# Patient Record
Sex: Female | Born: 1935 | Race: Black or African American | Hispanic: No | Marital: Married | Smoking: Never smoker
Health system: Southern US, Community
[De-identification: ages and names within clinical notes are randomized; demographics above are authoritative.]

## PROBLEM LIST (undated history)

## (undated) DIAGNOSIS — M1990 Unspecified osteoarthritis, unspecified site: Secondary | ICD-10-CM

## (undated) DIAGNOSIS — F419 Anxiety disorder, unspecified: Secondary | ICD-10-CM

## (undated) DIAGNOSIS — F0280 Dementia in other diseases classified elsewhere without behavioral disturbance: Secondary | ICD-10-CM

## (undated) DIAGNOSIS — E559 Vitamin D deficiency, unspecified: Secondary | ICD-10-CM

## (undated) DIAGNOSIS — R636 Underweight: Secondary | ICD-10-CM

## (undated) DIAGNOSIS — IMO0001 Reserved for inherently not codable concepts without codable children: Secondary | ICD-10-CM

## (undated) DIAGNOSIS — K649 Unspecified hemorrhoids: Secondary | ICD-10-CM

## (undated) DIAGNOSIS — G8929 Other chronic pain: Secondary | ICD-10-CM

## (undated) DIAGNOSIS — H269 Unspecified cataract: Secondary | ICD-10-CM

## (undated) DIAGNOSIS — E43 Unspecified severe protein-calorie malnutrition: Secondary | ICD-10-CM

## (undated) DIAGNOSIS — D649 Anemia, unspecified: Secondary | ICD-10-CM

## (undated) DIAGNOSIS — E119 Type 2 diabetes mellitus without complications: Secondary | ICD-10-CM

## (undated) DIAGNOSIS — E538 Deficiency of other specified B group vitamins: Secondary | ICD-10-CM

## (undated) DIAGNOSIS — I10 Essential (primary) hypertension: Secondary | ICD-10-CM

## (undated) DIAGNOSIS — G309 Alzheimer's disease, unspecified: Secondary | ICD-10-CM

## (undated) DIAGNOSIS — R296 Repeated falls: Secondary | ICD-10-CM

## (undated) DIAGNOSIS — I739 Peripheral vascular disease, unspecified: Secondary | ICD-10-CM

## (undated) DIAGNOSIS — K5909 Other constipation: Secondary | ICD-10-CM

## (undated) DIAGNOSIS — N289 Disorder of kidney and ureter, unspecified: Secondary | ICD-10-CM

## (undated) DIAGNOSIS — H9190 Unspecified hearing loss, unspecified ear: Secondary | ICD-10-CM

## (undated) DIAGNOSIS — I459 Conduction disorder, unspecified: Secondary | ICD-10-CM

## (undated) DIAGNOSIS — K5641 Fecal impaction: Secondary | ICD-10-CM

## (undated) HISTORY — PX: APPENDECTOMY: SHX54

## (undated) HISTORY — PX: CATARACT EXTRACTION, BILATERAL: SHX1313

## (undated) HISTORY — PX: TONSILLECTOMY: SUR1361

---

## 1997-06-24 ENCOUNTER — Other Ambulatory Visit: Admission: RE | Admit: 1997-06-24 | Discharge: 1997-06-24 | Payer: Self-pay | Admitting: Internal Medicine

## 1999-01-25 ENCOUNTER — Encounter: Payer: Self-pay | Admitting: *Deleted

## 1999-01-25 ENCOUNTER — Encounter: Admission: RE | Admit: 1999-01-25 | Discharge: 1999-01-25 | Payer: Self-pay | Admitting: *Deleted

## 2001-05-24 ENCOUNTER — Observation Stay (HOSPITAL_COMMUNITY): Admission: EM | Admit: 2001-05-24 | Discharge: 2001-05-25 | Payer: Self-pay | Admitting: Emergency Medicine

## 2003-07-21 ENCOUNTER — Emergency Department (HOSPITAL_COMMUNITY): Admission: EM | Admit: 2003-07-21 | Discharge: 2003-07-21 | Payer: Self-pay | Admitting: Emergency Medicine

## 2005-07-10 ENCOUNTER — Encounter: Admission: RE | Admit: 2005-07-10 | Discharge: 2005-07-10 | Payer: Self-pay | Admitting: Internal Medicine

## 2006-02-28 ENCOUNTER — Encounter: Admission: RE | Admit: 2006-02-28 | Discharge: 2006-02-28 | Payer: Self-pay | Admitting: Internal Medicine

## 2010-07-02 ENCOUNTER — Emergency Department (HOSPITAL_COMMUNITY): Payer: Medicare Other

## 2010-07-02 ENCOUNTER — Emergency Department (HOSPITAL_COMMUNITY)
Admission: EM | Admit: 2010-07-02 | Discharge: 2010-07-03 | Disposition: A | Discharge status: Home / Self Care | Payer: Medicare Other | Attending: Emergency Medicine | Admitting: Emergency Medicine

## 2010-07-02 DIAGNOSIS — T1490XA Injury, unspecified, initial encounter: Secondary | ICD-10-CM | POA: Insufficient documentation

## 2010-07-02 DIAGNOSIS — W19XXXA Unspecified fall, initial encounter: Secondary | ICD-10-CM | POA: Insufficient documentation

## 2010-07-02 DIAGNOSIS — M5030 Other cervical disc degeneration, unspecified cervical region: Secondary | ICD-10-CM | POA: Insufficient documentation

## 2010-07-02 DIAGNOSIS — N390 Urinary tract infection, site not specified: Secondary | ICD-10-CM | POA: Insufficient documentation

## 2010-07-02 DIAGNOSIS — M542 Cervicalgia: Secondary | ICD-10-CM | POA: Insufficient documentation

## 2010-07-02 DIAGNOSIS — R51 Headache: Secondary | ICD-10-CM | POA: Insufficient documentation

## 2010-07-02 DIAGNOSIS — M25569 Pain in unspecified knee: Secondary | ICD-10-CM | POA: Insufficient documentation

## 2010-07-02 DIAGNOSIS — R059 Cough, unspecified: Secondary | ICD-10-CM | POA: Insufficient documentation

## 2010-07-02 DIAGNOSIS — Y92009 Unspecified place in unspecified non-institutional (private) residence as the place of occurrence of the external cause: Secondary | ICD-10-CM | POA: Insufficient documentation

## 2010-07-02 DIAGNOSIS — R0602 Shortness of breath: Secondary | ICD-10-CM | POA: Insufficient documentation

## 2010-07-02 DIAGNOSIS — R05 Cough: Secondary | ICD-10-CM | POA: Insufficient documentation

## 2010-07-02 DIAGNOSIS — M79609 Pain in unspecified limb: Secondary | ICD-10-CM | POA: Insufficient documentation

## 2010-07-02 DIAGNOSIS — M25559 Pain in unspecified hip: Secondary | ICD-10-CM | POA: Insufficient documentation

## 2010-07-02 LAB — URINALYSIS, ROUTINE W REFLEX MICROSCOPIC
Ketones, ur: NEGATIVE mg/dL
Nitrite: NEGATIVE
Protein, ur: 100 mg/dL — AB
Urobilinogen, UA: 1 mg/dL (ref 0.0–1.0)

## 2010-07-02 LAB — GLUCOSE, CAPILLARY: Glucose-Capillary: 152 mg/dL — ABNORMAL HIGH (ref 70–99)

## 2010-07-02 LAB — URINE MICROSCOPIC-ADD ON

## 2010-07-03 LAB — URINE CULTURE
Colony Count: 25000
Culture  Setup Time: 201205150343

## 2010-12-09 ENCOUNTER — Emergency Department (HOSPITAL_COMMUNITY): Payer: Medicare Other

## 2010-12-09 ENCOUNTER — Observation Stay (HOSPITAL_COMMUNITY)
Admission: EM | Admit: 2010-12-09 | Discharge: 2010-12-12 | Disposition: A | Discharge status: Home Health | Payer: Medicare Other | Attending: Internal Medicine | Admitting: Internal Medicine

## 2010-12-09 DIAGNOSIS — M25519 Pain in unspecified shoulder: Secondary | ICD-10-CM | POA: Insufficient documentation

## 2010-12-09 DIAGNOSIS — R55 Syncope and collapse: Secondary | ICD-10-CM | POA: Insufficient documentation

## 2010-12-09 DIAGNOSIS — R197 Diarrhea, unspecified: Secondary | ICD-10-CM | POA: Insufficient documentation

## 2010-12-09 DIAGNOSIS — K7689 Other specified diseases of liver: Secondary | ICD-10-CM | POA: Insufficient documentation

## 2010-12-09 DIAGNOSIS — E119 Type 2 diabetes mellitus without complications: Secondary | ICD-10-CM | POA: Insufficient documentation

## 2010-12-09 DIAGNOSIS — R5381 Other malaise: Secondary | ICD-10-CM | POA: Insufficient documentation

## 2010-12-09 DIAGNOSIS — I10 Essential (primary) hypertension: Secondary | ICD-10-CM | POA: Insufficient documentation

## 2010-12-09 DIAGNOSIS — I252 Old myocardial infarction: Secondary | ICD-10-CM | POA: Insufficient documentation

## 2010-12-09 DIAGNOSIS — R1032 Left lower quadrant pain: Principal | ICD-10-CM | POA: Insufficient documentation

## 2010-12-09 DIAGNOSIS — I2510 Atherosclerotic heart disease of native coronary artery without angina pectoris: Secondary | ICD-10-CM | POA: Insufficient documentation

## 2010-12-09 DIAGNOSIS — R112 Nausea with vomiting, unspecified: Secondary | ICD-10-CM | POA: Insufficient documentation

## 2010-12-09 DIAGNOSIS — I498 Other specified cardiac arrhythmias: Secondary | ICD-10-CM | POA: Insufficient documentation

## 2010-12-09 LAB — CBC
HCT: 35.4 % — ABNORMAL LOW (ref 36.0–46.0)
MCHC: 33.6 g/dL (ref 30.0–36.0)
MCV: 70.5 fL — ABNORMAL LOW (ref 78.0–100.0)
Platelets: 233 10*3/uL (ref 150–400)
WBC: 5 10*3/uL (ref 4.0–10.5)

## 2010-12-09 LAB — URINALYSIS, ROUTINE W REFLEX MICROSCOPIC
Bilirubin Urine: NEGATIVE
Glucose, UA: NEGATIVE mg/dL
Hgb urine dipstick: NEGATIVE
Protein, ur: NEGATIVE mg/dL
Urobilinogen, UA: 1 mg/dL (ref 0.0–1.0)

## 2010-12-09 LAB — COMPREHENSIVE METABOLIC PANEL
Albumin: 3.8 g/dL (ref 3.5–5.2)
Alkaline Phosphatase: 62 U/L (ref 39–117)
BUN: 21 mg/dL (ref 6–23)
CO2: 23 mEq/L (ref 19–32)
Chloride: 97 mEq/L (ref 96–112)
Creatinine, Ser: 0.84 mg/dL (ref 0.50–1.10)
GFR calc Af Amer: 68 mL/min — ABNORMAL LOW (ref >90)
GFR calc non Af Amer: 59 mL/min — ABNORMAL LOW (ref >90)
Glucose, Bld: 130 mg/dL — ABNORMAL HIGH (ref 70–99)
Potassium: 4.2 mEq/L (ref 3.5–5.1)
Total Protein: 7.2 g/dL (ref 6.0–8.3)

## 2010-12-09 LAB — URINE MICROSCOPIC-ADD ON

## 2010-12-09 LAB — POCT I-STAT TROPONIN I: Troponin i, poc: 0 ng/mL (ref 0.00–0.08)

## 2010-12-09 MED ORDER — IOHEXOL 300 MG/ML  SOLN
100.0000 mL | Freq: Once | INTRAMUSCULAR | Status: AC | PRN
  Administered 2010-12-09: 100 mL via INTRAVENOUS

## 2010-12-10 LAB — CBC
MCHC: 32.7 g/dL (ref 30.0–36.0)
MCV: 70.2 fL — ABNORMAL LOW (ref 78.0–100.0)
RBC: 4.7 MIL/uL (ref 3.87–5.11)
WBC: 4.4 10*3/uL (ref 4.0–10.5)

## 2010-12-10 LAB — PRO B NATRIURETIC PEPTIDE: Pro B Natriuretic peptide (BNP): 371.9 pg/mL (ref 0–450)

## 2010-12-10 LAB — GLUCOSE, CAPILLARY
Glucose-Capillary: 93 mg/dL (ref 70–99)
Glucose-Capillary: 113 mg/dL — ABNORMAL HIGH (ref 70–99)
Glucose-Capillary: 95 mg/dL (ref 70–99)

## 2010-12-10 LAB — BASIC METABOLIC PANEL
CO2: 29 mEq/L (ref 19–32)
Calcium: 9.5 mg/dL (ref 8.4–10.5)
GFR calc non Af Amer: 51 mL/min — ABNORMAL LOW (ref >90)
Glucose, Bld: 88 mg/dL (ref 70–99)
Potassium: 4 mEq/L (ref 3.5–5.1)
Sodium: 136 mEq/L (ref 135–145)

## 2010-12-10 LAB — TROPONIN I: Troponin I: <0.3 ng/mL (ref <0.30)

## 2010-12-11 NOTE — H&P (Signed)
NAMELULANI, BOUR NO.:  [id number]  MEDICAL RECORD NO.:  [id number]  LOCATION:  2002                         FACILITY:  [facility]  PHYSICIAN:  Thomasenia Bottoms, MDDATE OF BIRTH:  01-16-1917  DATE OF ADMISSION:  12/09/2010 DATE OF DISCHARGE:                             HISTORY & PHYSICAL   PRIMARY CARE PHYSICIAN:  Dr. Earl Gala.  CARDIOLOGIST:  Grubbs.  CHIEF COMPLAINT:  Episode of sickness and near syncope in church.  HISTORY OF PRESENTING ILLNESS:  Ms. Shropshire is a very pleasant 77 year old woman who was brought in after an episode at church while she felt very sick.  The patient though she is an excellent historian on most things does not recall the episode that happened today in church.  I had to get this from the ED records and from the patient's grandson who was not present with her, but she apparently was nauseated and anxious and also had epigastric pain and they had to leave church.  Once the patient got to the emergency department, her primary complaint was abdominal pain.  It is in the epigastric area.  She tells me she has been having some trouble with diarrhea, actually had a dark stool a few days ago, and the epigastric pain has been pretty constant, but the nausea and possible diaphoresis and the anxiety sort of episode that happened at church, I do not have any more specific details about.  PAST MEDICAL HISTORY:  Significant for an MI at some point in the past. She has a history of diabetes and hypertension.  She was hospitalized a few months ago after a fall, but there are no records of that hospitalization here.  SOCIAL HISTORY:  She does not smoke cigarettes, drink alcohol, or use any illicit drugs.  FAMILY HISTORY:  Noncontributory in this 77 year old, however, she does have a son who is present and appears healthy.  REVIEW OF SYSTEMS:  The patient is not able to recall many details of her complaints over the last couple of  days.  She walks with a cane, but was wearing high heels at church today while walking with her cane, so she appears to be fairly functional.  She does report that she has been having some diarrhea, but she has been eating.  She had cereal this morning prior to going to church.  She also does tell me she was awake all night last night.  She had trouble sleeping.  Her power was out for some time yesterday evening.  The patient is unable to provide any other reliable history.  She denies, however, any fevers or chest pain or shortness of breath or headache.  PHYSICAL EXAMINATION:  VITAL SIGNS:  In the emergency department, her initial temperature is 97.5, blood pressure 139/49, pulse 61, respiratory rate 18, O2 sats 100% on 2 L nasal cannula. GENERAL:  The patient looks much younger than her stated age.  She is interactive and vibrant. HEENT:  Normocephalic and atraumatic.  Her pupils are equal and round. Her sclerae are nonicteric.  Oral mucosa moist. NECK:  Supple.  No lymphadenopathy.  No thyromegaly.  No jugular venous distention.CARDIAC:  Bradycardic, but regular. LUNGS:  Clear to  auscultation bilaterally.  No wheezes, rhonchi, or rales. ABDOMEN:  Slightly distended.  She has tenderness in the epigastric region.  No rebound or guarding.  Bowel sounds are quiet.  No hepatosplenomegaly. EXTREMITIES:  No evidence of clubbing, cyanosis, or pitting edema.  She has faintly palpable DP pulses bilaterally. SKIN:  Warm, dry, and intact.  No open lesions or rashes. MUSCULOSKELETAL:  No evidence of effusion of her joints. NEUROLOGICAL:  She is alert and oriented x3.  She is attentive and appropriate.  She follows commands.  Has some difficulty with short-term memory, but overall is extremely appropriate and interactive.  She moves each of her extremities spontaneously with no clear evidence of focal motor asymmetry.  We did get her up.  I did not watch her gait.  She has some diffuse  weakness, but is able to take 3-4 steps with assistance and then 3-4 steps backwards back to the gurney without any difficulties with shortness of breath or lightheadedness or dizziness.  Her heart rate was in the 60s when this occurred and it did not change with her activities.  DATA:  The patient had an EKG which reveals sinus bradycardia, heart rate 61.  No ST-segment elevation or depression.  She has some mild T- wave flattening in the lateral leads.  This EKG is mostly unchanged from EKG from April 2003.  The patient had a CT scan of her abdomen and pelvis because of the pain.  It revealed clear lung bases.  Two small hepatic cysts.  Poor motility and/or reflux disease of the distal esophagus, but no other acute findings.  She does have two small hepatic cysts and two renal cysts.  Chest x-ray reveals clear lungs, no acute findings.  Lipase is 43.  Guaiac stool is negative.  Urinalysis reveals 0-2 wbc's, rare squamous epithelial cells, clear urine, negative nitrate, trace leukocyte esterase.  Sodium is 131, potassium 4.2, chloride 97, glucose 130, BUN 20, creatinine 0.82, total bili 0.4, alk phos 62, AST 19, ALT 11.  Troponin 0.04.  White count is 5.0, hemoglobin is 11.9, hematocrit is 35, platelet count is 233.  ASSESSMENT AND PLAN: 1. Bradycardia.  The ED physician called [place] to admit the patient for     possible symptomatic bradycardia.  Though reviewing her records,     she has had bradycardia for many years.  Her last EKG was in 2003     nine years ago.  The episode at church today where she was     nauseated and lightheaded was very vague and we do not have great     details, but we will admit her overnight to monitor her heart rate     and certainly since she has a cardiologist in [place] we will     consider consulting [facility] and we also will await her     full med list to see if there are any medications that affect her     rate.  When I ambulated the patient,  her heart rate did not improve     with the ambulation. 2. Epigastric pain.  The CT of her abdomen and pelvis that is     negative.  Her stool guaiac is negative.  She has had some recent     diarrhea.  While on exam, she is still tender. We would just follow     the patient clinically and provide supportive care.  I will put her     on a proton pump inhibitor  for this evening and will follow. 3. Diabetes mellitus.  We are going to hold her metformin for now and     put her on sliding scale.  Given her advanced age, I am a little     hesitant to give her a long-acting insulin, so we may just follow     her for now. 4. History of coronary artery disease.  We will continue her aspirin     daily and will await her full med list.  This     patient does have some short-term memory trouble, but she is quite     functional and appears to have a supportive family.  I did not,     however, discuss code status with her.  This probably should be     done in the presence of her power of attorney or her medical     decision maker.     Thomasenia Bottoms, MD     CVC/MEDQ  D:  12/09/2010  T:  12/10/2010  Job:  [id number]  cc:   [facility] Dr. Earl Gala  Electronically Signed by Buena Irish MD on 12/11/2010 12:18:01 PM

## 2010-12-12 LAB — GLUCOSE, CAPILLARY
Glucose-Capillary: 105 mg/dL — ABNORMAL HIGH (ref 70–99)
Glucose-Capillary: 94 mg/dL (ref 70–99)

## 2010-12-13 LAB — GLUCOSE, CAPILLARY
Glucose-Capillary: 94 mg/dL (ref 70–99)
Glucose-Capillary: 120 mg/dL — ABNORMAL HIGH (ref 70–99)
Glucose-Capillary: 84 mg/dL (ref 70–99)
Glucose-Capillary: 116 mg/dL — ABNORMAL HIGH (ref 70–99)

## 2010-12-14 NOTE — Discharge Summary (Signed)
[name], [name] NO.:  [id number]  MEDICAL RECORD NO.:  [id number]  LOCATION:  2002                         FACILITY:  [facility]  PHYSICIAN:  Manson Passey, MD        DATE OF BIRTH:  17-Jul-1916  DATE OF ADMISSION:  12/09/2010 DATE OF DISCHARGE:  12/11/2010                              DISCHARGE SUMMARY   PRIMARY CARE PHYSICIAN:  Theressa Millard, MD  PRIMARY CARDIOLOGIST:  ______ Corinda Gubler  DISCHARGE DIAGNOSES: 1. Abdominal pain, nausea, vomiting. 2. Diabetes. 3. Hypertension. 4. History of myocardial infarction, remote.  DISCHARGE MEDICATIONS: 1. Aspirin 81 mg daily. 2. Exforge (amlodipine/valsartan) 10/160 mg by mouth daily. 3. Honey lemon cough drops 1 lozenge daily. 4. Lorazepam 0.5 mg twice daily as needed. 5. Metformin 500 mg twice daily. 6. Metoprolol tartrate 25 mg daily. 7. Vitamin C 60 mg daily. 8. Voltaren 1% transdermally daily.  CONSULTATIONS:  None.  DIAGNOSTIC STUDIES: 1. On December 09, 2010, CT abdomen and pelvis with contrast, which     shows no acute findings in the chest or abdominal area.  Contrast     material in the visualize is also goes compatible with poor     motility and/or reflux.  There are 2 small hepatic cysts and 2 left     renal cysts noted. 2. On December 09, 2010, chest x-ray which shows no acute     cardiopulmonary disease.  DISCHARGE LABORATORY:  On December 10, 2010, TSH 1.69, sodium 136, potassium 4, chloride 99, bicarb 29, glucose 88, BUN 16, creatinine 0.94, calcium 9.5.  Two sets of cardiac enzymes negative.  BNP 372. White blood cells 4.4, hemoglobin 10.8, hematocrit 33, platelets 213, and lipase 43.  HISTORY OF PRESENT ILLNESS:  The patient is a 77 year old woman with a history of hypertension, diabetes, gastroesophageal reflux disease who was initially brought in the emergency department after feeling very sick in church.  The patient was complaining of nausea and being anxious as well as having epigastric  pain.  Her pain was about 5 to 6/10 in intensity, nonradiating, and no alleviating or aggravating factors.  Her epigastric pain has been constant.  And there was no associated vomiting.  The patient has been admitted to hospitalist service for further evaluation and management.  PHYSICAL EXAMINATION:  VITAL SIGNS:  Blood pressure 127/73, pulse 50, respirations 18, temperature 98.4 Fahrenheit, and oxygen saturation 100% on room air.  GENERAL APPEARANCE:  No acute distress, the patient appears comfortable. SKIN:  Warm, dry. NECK:  Supple, no lymphadenopathy, no carotid bruits. LUNGS:  Clear to auscultation bilaterally, no rhonchi, no wheezing, no rales. CARDIOVASCULAR:  Positive S1 and S2, no murmurs, regular rate and rhythm. ABDOMEN:  Positive bowel sounds, soft, nontender/nondistended. EXTREMITIES:  Pulses palpable bilaterally, no lower extremity edema. NEUROLOGICAL:  Alert, awake, oriented x3, no focal neurologic deficits.  HOSPITAL COURSE BY PROBLEM: 1. Abdominal pain, nausea, and vomiting.  On the day of discharge, the     patient has no complaints of abdominal pain, or nausea or vomiting.     She has been known to Zofran as needed for nausea, but at present     she feels fine and she tolerates solid  food on the day of     discharge. 2. Hypertension.  Blood pressure on discharge 137//73.  The patient is     hemodynamically stable and her blood pressure is at goal.  Please     continue taking the same home medication as before. 3. History of bradycardia.  Pulse on discharge is 50.  As per primary     care physician, this has been a chronic issue for respirations.     She is on very low dose of metoprolol 25 mg once daily.  As of     right now, we will not make any changes to the medications.  Please     follow up with your primary care physician to reassess the need for     any further medication adjustments. 4. Diabetes.  Blood glucose measurements while in hospital are      following 97, 95, 113.  The patient can resume taking metformin 500     mg twice daily as before the admission. 5. History of myocardial infarction in the past.  The patient can     continue taking aspirin on discharge.  The patient is medically     stable and clinically appears well to be discharged home with home     health services.  Total time spent discharging the patient more     than 35 minutes.          ______________________________ Manson Passey, MD     AD/MEDQ  D:  12/11/2010  T:  12/11/2010  Job:  [id number]  cc:   Theressa Millard, M.D.  Electronically Signed by Manson Passey MD on 12/14/2010 03:11:43 PM

## 2012-12-14 ENCOUNTER — Encounter (HOSPITAL_COMMUNITY): Payer: Self-pay | Admitting: Emergency Medicine

## 2012-12-14 ENCOUNTER — Emergency Department (HOSPITAL_COMMUNITY): Payer: Medicare Other

## 2012-12-14 ENCOUNTER — Inpatient Hospital Stay (HOSPITAL_COMMUNITY)
Admission: EM | Admit: 2012-12-14 | Discharge: 2012-12-18 | DRG: 689 | Disposition: A | Discharge status: Skilled Nursing Facility | Payer: Medicare Other | Attending: Internal Medicine | Admitting: Internal Medicine

## 2012-12-14 DIAGNOSIS — H9190 Unspecified hearing loss, unspecified ear: Secondary | ICD-10-CM | POA: Diagnosis present

## 2012-12-14 DIAGNOSIS — W010XXA Fall on same level from slipping, tripping and stumbling without subsequent striking against object, initial encounter: Secondary | ICD-10-CM | POA: Diagnosis present

## 2012-12-14 DIAGNOSIS — D72829 Elevated white blood cell count, unspecified: Secondary | ICD-10-CM | POA: Diagnosis present

## 2012-12-14 DIAGNOSIS — Y92009 Unspecified place in unspecified non-institutional (private) residence as the place of occurrence of the external cause: Secondary | ICD-10-CM

## 2012-12-14 DIAGNOSIS — I129 Hypertensive chronic kidney disease with stage 1 through stage 4 chronic kidney disease, or unspecified chronic kidney disease: Secondary | ICD-10-CM | POA: Diagnosis present

## 2012-12-14 DIAGNOSIS — M129 Arthropathy, unspecified: Secondary | ICD-10-CM | POA: Diagnosis present

## 2012-12-14 DIAGNOSIS — W19XXXS Unspecified fall, sequela: Secondary | ICD-10-CM

## 2012-12-14 DIAGNOSIS — E871 Hypo-osmolality and hyponatremia: Secondary | ICD-10-CM | POA: Diagnosis present

## 2012-12-14 DIAGNOSIS — F0280 Dementia in other diseases classified elsewhere without behavioral disturbance: Secondary | ICD-10-CM | POA: Diagnosis present

## 2012-12-14 DIAGNOSIS — Z681 Body mass index (BMI) 19 or less, adult: Secondary | ICD-10-CM

## 2012-12-14 DIAGNOSIS — K649 Unspecified hemorrhoids: Secondary | ICD-10-CM | POA: Diagnosis present

## 2012-12-14 DIAGNOSIS — M1990 Unspecified osteoarthritis, unspecified site: Secondary | ICD-10-CM | POA: Diagnosis present

## 2012-12-14 DIAGNOSIS — E43 Unspecified severe protein-calorie malnutrition: Secondary | ICD-10-CM | POA: Diagnosis present

## 2012-12-14 DIAGNOSIS — Z9181 History of falling: Secondary | ICD-10-CM

## 2012-12-14 DIAGNOSIS — G309 Alzheimer's disease, unspecified: Secondary | ICD-10-CM | POA: Diagnosis present

## 2012-12-14 DIAGNOSIS — N179 Acute kidney failure, unspecified: Secondary | ICD-10-CM | POA: Diagnosis present

## 2012-12-14 DIAGNOSIS — R627 Adult failure to thrive: Secondary | ICD-10-CM | POA: Diagnosis present

## 2012-12-14 DIAGNOSIS — Z66 Do not resuscitate: Secondary | ICD-10-CM | POA: Diagnosis present

## 2012-12-14 DIAGNOSIS — I10 Essential (primary) hypertension: Secondary | ICD-10-CM | POA: Diagnosis present

## 2012-12-14 DIAGNOSIS — N289 Disorder of kidney and ureter, unspecified: Secondary | ICD-10-CM

## 2012-12-14 DIAGNOSIS — G9341 Metabolic encephalopathy: Secondary | ICD-10-CM | POA: Diagnosis present

## 2012-12-14 DIAGNOSIS — R636 Underweight: Secondary | ICD-10-CM | POA: Diagnosis present

## 2012-12-14 DIAGNOSIS — E1169 Type 2 diabetes mellitus with other specified complication: Secondary | ICD-10-CM | POA: Diagnosis present

## 2012-12-14 DIAGNOSIS — N390 Urinary tract infection, site not specified: Principal | ICD-10-CM | POA: Diagnosis present

## 2012-12-14 DIAGNOSIS — W19XXXA Unspecified fall, initial encounter: Secondary | ICD-10-CM

## 2012-12-14 DIAGNOSIS — N184 Chronic kidney disease, stage 4 (severe): Secondary | ICD-10-CM | POA: Diagnosis present

## 2012-12-14 DIAGNOSIS — K5641 Fecal impaction: Secondary | ICD-10-CM | POA: Diagnosis present

## 2012-12-14 DIAGNOSIS — E860 Dehydration: Secondary | ICD-10-CM | POA: Diagnosis present

## 2012-12-14 HISTORY — DX: Type 2 diabetes mellitus without complications: E11.9

## 2012-12-14 HISTORY — DX: Unspecified hemorrhoids: K64.9

## 2012-12-14 HISTORY — DX: Essential (primary) hypertension: I10

## 2012-12-14 HISTORY — DX: Underweight: R63.6

## 2012-12-14 HISTORY — DX: Alzheimer's disease, unspecified: G30.9

## 2012-12-14 HISTORY — DX: Dementia in other diseases classified elsewhere, unspecified severity, without behavioral disturbance, psychotic disturbance, mood disturbance, and anxiety: F02.80

## 2012-12-14 HISTORY — DX: Unspecified osteoarthritis, unspecified site: M19.90

## 2012-12-14 HISTORY — DX: Unspecified hearing loss, unspecified ear: H91.90

## 2012-12-14 HISTORY — DX: Disorder of kidney and ureter, unspecified: N28.9

## 2012-12-14 HISTORY — DX: Fecal impaction: K56.41

## 2012-12-14 HISTORY — DX: Unspecified severe protein-calorie malnutrition: E43

## 2012-12-14 HISTORY — DX: Unspecified cataract: H26.9

## 2012-12-14 HISTORY — DX: Repeated falls: R29.6

## 2012-12-14 HISTORY — DX: Reserved for inherently not codable concepts without codable children: IMO0001

## 2012-12-14 LAB — CBC WITH DIFFERENTIAL/PLATELET
Basophils Relative: 0 % (ref 0–1)
Eosinophils Relative: 0 % (ref 0–5)
HCT: 35.7 % — ABNORMAL LOW (ref 36.0–46.0)
Hemoglobin: 12.3 g/dL (ref 12.0–15.0)
Lymphocytes Relative: 5 % — ABNORMAL LOW (ref 12–46)
Lymphs Abs: 0.7 10*3/uL (ref 0.7–4.0)
MCH: 23.7 pg — ABNORMAL LOW (ref 26.0–34.0)
Monocytes Absolute: 1.5 10*3/uL — ABNORMAL HIGH (ref 0.1–1.0)
Monocytes Relative: 11 % (ref 3–12)
Neutro Abs: 11.4 10*3/uL — ABNORMAL HIGH (ref 1.7–7.7)
WBC: 13.6 10*3/uL — ABNORMAL HIGH (ref 4.0–10.5)

## 2012-12-14 LAB — BASIC METABOLIC PANEL
BUN: 45 mg/dL — ABNORMAL HIGH (ref 6–23)
CO2: 23 mEq/L (ref 19–32)
Chloride: 91 mEq/L — ABNORMAL LOW (ref 96–112)
Creatinine, Ser: 1.16 mg/dL — ABNORMAL HIGH (ref 0.50–1.10)
GFR calc Af Amer: 45 mL/min — ABNORMAL LOW (ref >90)
Glucose, Bld: 116 mg/dL — ABNORMAL HIGH (ref 70–99)
Potassium: 4.7 mEq/L (ref 3.5–5.1)

## 2012-12-14 LAB — URINALYSIS, ROUTINE W REFLEX MICROSCOPIC
Ketones, ur: NEGATIVE mg/dL
Nitrite: NEGATIVE
Protein, ur: 30 mg/dL — AB
Specific Gravity, Urine: 1.026 (ref 1.005–1.030)
pH: 5 (ref 5.0–8.0)

## 2012-12-14 LAB — URINE MICROSCOPIC-ADD ON

## 2012-12-14 MED ORDER — POLYETHYLENE GLYCOL 3350 17 G PO PACK
17.0000 g | PACK | Freq: Every day | ORAL | Status: DC | PRN
  Filled 2012-12-14: qty 1

## 2012-12-14 MED ORDER — ACETAMINOPHEN 325 MG PO TABS
650.0000 mg | ORAL_TABLET | Freq: Four times a day (QID) | ORAL | Status: DC | PRN
  Administered 2012-12-16 – 2012-12-18 (×3): 650 mg via ORAL
  Filled 2012-12-14 (×3): qty 2

## 2012-12-14 MED ORDER — SODIUM CHLORIDE 0.9 % IV SOLN: INTRAVENOUS | Status: DC

## 2012-12-14 MED ORDER — DEXTROSE 5 % IV SOLN
1.0000 g | Freq: Once | INTRAVENOUS | Status: AC
  Administered 2012-12-14: 1 g via INTRAVENOUS
  Filled 2012-12-14: qty 10

## 2012-12-14 MED ORDER — SODIUM CHLORIDE 0.9 % IV BOLUS (SEPSIS)
500.0000 mL | Freq: Once | INTRAVENOUS | Status: AC
  Administered 2012-12-14: 500 mL via INTRAVENOUS

## 2012-12-14 MED ORDER — HYDROCORTISONE 2.5 % RE CREA
TOPICAL_CREAM | Freq: Two times a day (BID) | RECTAL | Status: DC | PRN
Start: 2012-12-14 — End: 2012-12-18

## 2012-12-14 MED ORDER — DICLOFENAC SODIUM 1 % TD GEL
4.0000 g | Freq: Two times a day (BID) | TRANSDERMAL | Status: DC
  Administered 2012-12-14 – 2012-12-18 (×8): 4 g via TOPICAL
  Filled 2012-12-14: qty 100

## 2012-12-14 MED ORDER — SODIUM CHLORIDE 0.9 % IV SOLN: INTRAVENOUS | Status: AC

## 2012-12-14 MED ORDER — TRAMADOL HCL 50 MG PO TABS
50.0000 mg | ORAL_TABLET | Freq: Four times a day (QID) | ORAL | Status: DC | PRN
  Administered 2012-12-15: 50 mg via ORAL
  Filled 2012-12-14: qty 1

## 2012-12-14 MED ORDER — ONDANSETRON HCL 4 MG PO TABS: 4.0000 mg | ORAL_TABLET | Freq: Four times a day (QID) | ORAL | Status: DC | PRN

## 2012-12-14 MED ORDER — MUPIROCIN 2 % EX OINT
TOPICAL_OINTMENT | Freq: Two times a day (BID) | CUTANEOUS | Status: DC
  Administered 2012-12-14 – 2012-12-18 (×8): via TOPICAL
  Filled 2012-12-14: qty 22

## 2012-12-14 MED ORDER — ACETAMINOPHEN 650 MG RE SUPP: 650.0000 mg | Freq: Four times a day (QID) | RECTAL | Status: DC | PRN

## 2012-12-14 MED ORDER — ONDANSETRON HCL 4 MG/2ML IJ SOLN: 4.0000 mg | Freq: Four times a day (QID) | INTRAMUSCULAR | Status: DC | PRN

## 2012-12-14 MED ORDER — DOCUSATE SODIUM 100 MG PO CAPS
100.0000 mg | ORAL_CAPSULE | Freq: Two times a day (BID) | ORAL | Status: DC
  Administered 2012-12-14 – 2012-12-18 (×7): 100 mg via ORAL
  Filled 2012-12-14 (×9): qty 1

## 2012-12-14 MED ORDER — CLOTRIMAZOLE 1 % EX CREA
TOPICAL_CREAM | Freq: Two times a day (BID) | CUTANEOUS | Status: DC
  Administered 2012-12-14 – 2012-12-18 (×8): via TOPICAL
  Filled 2012-12-14: qty 15

## 2012-12-14 MED ORDER — CAPSAICIN 0.025 % EX CREA
TOPICAL_CREAM | Freq: Two times a day (BID) | CUTANEOUS | Status: DC
  Administered 2012-12-14 – 2012-12-18 (×8): via TOPICAL
  Filled 2012-12-14: qty 56.6

## 2012-12-14 MED ORDER — SENNA 8.6 MG PO TABS
1.0000 | ORAL_TABLET | Freq: Two times a day (BID) | ORAL | Status: DC
  Administered 2012-12-15 – 2012-12-18 (×4): 8.6 mg via ORAL
  Filled 2012-12-14 (×3): qty 1

## 2012-12-14 MED ORDER — CEFTRIAXONE SODIUM 1 G IJ SOLR
1.0000 g | INTRAMUSCULAR | Status: DC
  Filled 2012-12-14: qty 10

## 2012-12-14 MED ORDER — ENOXAPARIN SODIUM 30 MG/0.3ML ~~LOC~~ SOLN
30.0000 mg | SUBCUTANEOUS | Status: DC
  Administered 2012-12-14 – 2012-12-15 (×2): 30 mg via SUBCUTANEOUS
  Filled 2012-12-14 (×5): qty 0.3

## 2012-12-14 NOTE — ED Notes (Signed)
PER EMS- pt picked up from home with c/o fall.  Pt reports she was on the way to restroom while she was on the phone with daughter that lives in [place]. Daughter reports that pt put down phone and never came back to it. Daughter called family friend, that came to pts home and found her on the floor in the bathroom.  EMS reports small abrasion noted to L elbow. No other deformities noted.  Pt has hx of buttocks pain related to a fall that happened x6weeks. Pt ambulates with walker at home and was able to ambulate upon EMS arrival.  Pt has hx of alzheimers. PER friends that were on seen, pt alert and oriented per baseline.

## 2012-12-14 NOTE — ED Notes (Signed)
Patient transported to X-ray 

## 2012-12-14 NOTE — ED Provider Notes (Signed)
CSN: [id number]     Arrival date & time 12/14/12  1253 History   First MD Initiated Contact with Patient 12/14/12 1312     Chief Complaint  Patient presents with  . Fall   (Consider location/radiation/quality/duration/timing/severity/associated sxs/prior Treatment) Patient is a 79 y.o. female presenting with fall. The history is provided by the patient and the EMS personnel.  Fall This is a new problem. The current episode started 6 to 12 hours ago. Episode frequency: once. The problem has been resolved. Pertinent negatives include no chest pain, no abdominal pain, no headaches and no shortness of breath. Nothing aggravates the symptoms. Nothing relieves the symptoms. She has tried nothing for the symptoms. The treatment provided no relief.    Past Medical History  Diagnosis Date  . Alzheimer disease   . Hypertension   . Diabetes mellitus without complication   . Arthritis    History reviewed. No pertinent past surgical history. History reviewed. No pertinent family history. History  Substance Use Topics  . Smoking status: Never Smoker   . Smokeless tobacco: Not on file  . Alcohol Use: No   OB History   Grav Para Term Preterm Abortions TAB SAB Ect Mult Living                 Review of Systems  Constitutional: Negative for fever and fatigue.  HENT: Negative for congestion and drooling.   Eyes: Negative for pain.  Respiratory: Negative for cough and shortness of breath.   Cardiovascular: Negative for chest pain.  Gastrointestinal: Negative for nausea, vomiting, abdominal pain and diarrhea.  Genitourinary: Negative for dysuria and hematuria.  Musculoskeletal: Negative for back pain, gait problem and neck pain.  Skin: Negative for color change.  Neurological: Negative for dizziness and headaches.  Hematological: Negative for adenopathy.  Psychiatric/Behavioral: Negative for behavioral problems.  All other systems reviewed and are negative.    Allergies  Ace inhibitors;  Codeine; Penicillins; Sulfa antibiotics; and Tramadol  Home Medications   Current Outpatient Rx  Name  Route  Sig  Dispense  Refill  . amLODipine-valsartan (EXFORGE) 10-160 MG per tablet   Oral   Take 1 tablet by mouth daily.         Marland Kitchen aspirin EC 81 MG tablet   Oral   Take 162 mg by mouth daily.         . clotrimazole-betamethasone (LOTRISONE) cream   Topical   Apply 1 application topically 2 (two) times daily.         . diclofenac sodium (VOLTAREN) 1 % GEL   Topical   Apply 1 application topically 2 (two) times daily as needed (For pain.).          Marland Kitchen hydrocortisone cream (CORTAID MAXIMUM STRENGTH) 1 %   Topical   Apply 1 application topically 2 (two) times daily. Applies to affected area for rash.         Marland Kitchen LORazepam (ATIVAN) 0.5 MG tablet   Oral   Take 0.5 mg by mouth 2 (two) times daily as needed for anxiety.         . metoprolol succinate (TOPROL-XL) 25 MG 24 hr tablet   Oral   Take 12.5 mg by mouth daily.         Marland Kitchen OVER THE COUNTER MEDICATION   Oral   Take 1 each by mouth daily as needed (For mouth or throat.). Vitamin C 60mg  Drops         . OVER THE COUNTER MEDICATION  Oral   Take 1 each by mouth daily as needed (For mouth or throat.). Honey Lemon Cough Drops 8%         . traMADol (ULTRAM) 50 MG tablet   Oral   Take 50 mg by mouth 3 (three) times daily as needed (For buttock pain.).           BP 106/64  Pulse 89  Temp(Src) 97.7 F (36.5 C) (Oral)  Resp 18  SpO2 99% Physical Exam  Nursing note and vitals reviewed. Constitutional: She appears well-developed and well-nourished.  HENT:  Head: Normocephalic.  Mouth/Throat: Oropharynx is clear and moist. No oropharyngeal exudate.  Eyes: Conjunctivae and EOM are normal. Pupils are equal, round, and reactive to light.  Neck: Normal range of motion. Neck supple.  No vertebral ttp.   Cardiovascular: Normal rate, regular rhythm, normal heart sounds and intact distal pulses.  Exam reveals  no gallop and no friction rub.   No murmur heard. Pulmonary/Chest: Effort normal and breath sounds normal. No respiratory distress. She has no wheezes.  Abdominal: Soft. Bowel sounds are normal. There is no tenderness. There is no rebound and no guarding.  Musculoskeletal: Normal range of motion. She exhibits no edema and no tenderness.  Mild abrasion to left lateral elbow. Mild ttp of left elbow in this area and mid left humerus.   2+ distal pulses.   Mild right lateral hip pain.   Neurological: She is alert.  A/o x 1.   Skin: Skin is warm and dry.  Psychiatric: She has a normal mood and affect. Her behavior is normal.    ED Course  Procedures (including critical care time) Labs Review Labs Reviewed  CBC WITH DIFFERENTIAL - Abnormal; Notable for the following:    WBC 13.6 (*)    RBC 5.19 (*)    HCT 35.7 (*)    MCV 68.8 (*)    MCH 23.7 (*)    RDW 15.8 (*)    Neutrophils Relative % 84 (*)    Lymphocytes Relative 5 (*)    Neutro Abs 11.4 (*)    Monocytes Absolute 1.5 (*)    All other components within normal limits  BASIC METABOLIC PANEL - Abnormal; Notable for the following:    Sodium 132 (*)    Chloride 91 (*)    Glucose, Bld 116 (*)    BUN 45 (*)    Creatinine, Ser 1.16 (*)    GFR calc non Af Amer 39 (*)    GFR calc Af Amer 45 (*)    All other components within normal limits  URINALYSIS, ROUTINE W REFLEX MICROSCOPIC - Abnormal; Notable for the following:    APPearance TURBID (*)    Hgb urine dipstick LARGE (*)    Bilirubin Urine SMALL (*)    Protein, ur 30 (*)    Leukocytes, UA LARGE (*)    All other components within normal limits  URINE MICROSCOPIC-ADD ON - Abnormal; Notable for the following:    Squamous Epithelial / LPF FEW (*)    Casts HYALINE CASTS (*)    All other components within normal limits  BASIC METABOLIC PANEL  CBC  VITAMIN B12  TSH   Imaging Review Dg Chest 2 View  12/14/2012   CLINICAL DATA:  Fall, shortness of breath  EXAM: CHEST  2 VIEW   COMPARISON:  12/09/2010  FINDINGS: Lungs are clear.  No pleural effusion or pneumothorax.  The heart is normal in size.  Degenerative changes of the thoracic spine and  bilateral shoulders, right greater than left.  IMPRESSION: No evidence of acute cardiopulmonary disease.   Electronically Signed   By: Charline Bills M.D.   On: 12/14/2012 14:27   Dg Pelvis 1-2 Views  12/14/2012   CLINICAL DATA:  Fall, low back/ pelvic pain  EXAM: PELVIS - 1-2 VIEW  COMPARISON:  11/03/2012  FINDINGS: No fracture or dislocation is seen.  Mild degenerative changes of the bilateral hips.  Visualized bony pelvis appears intact.  IMPRESSION: No fracture or dislocation is seen.   Electronically Signed   By: Charline Bills M.D.   On: 12/14/2012 14:35   Dg Elbow Complete Left  12/14/2012   CLINICAL DATA:  Left elbow pain following injury  EXAM: LEFT ELBOW - COMPLETE 3+ VIEW  COMPARISON:  None.  FINDINGS: There is no evidence of fracture, dislocation, or joint effusion. There is no evidence of arthropathy or other focal bone abnormality. Soft tissues are unremarkable  IMPRESSION: No acute abnormality noted.   Electronically Signed   By: Alcide Clever M.D.   On: 12/14/2012 14:19   Ct Head Wo Contrast  12/14/2012   CLINICAL DATA:  Fell wall talking on the phone, found on floor in bathroom, history hypertension, diabetes, Alzheimer dementia  EXAM: CT HEAD WITHOUT CONTRAST  CT CERVICAL SPINE WITHOUT CONTRAST  TECHNIQUE: Multidetector CT imaging of the head and cervical spine was performed following the standard protocol without intravenous contrast. Multiplanar CT image reconstructions of the cervical spine were also generated.  COMPARISON:  07/02/2010  FINDINGS: CT HEAD FINDINGS  Generalized atrophy.  Normal ventricular morphology.  No midline shift or mass effect.  Small vessel chronic ischemic changes of deep cerebral white matter.  No intracranial hemorrhage, mass lesion, or acute infarction.  Visualized paranasal sinuses and  mastoid air cells clear.  Bones unremarkable.  CT CERVICAL SPINE FINDINGS  Inferior T1 excluded anteriorly.  Prevertebral soft tissues normal thickness.  Multilevel facet degenerative changes of the cervical spine.  Mild anterolisthesis these at C3-C4 and C4-C5, perhaps slightly greater at C3-C4 though this could be related to slight flexion.  Vertebral body heights maintained without fracture or additional subluxation.  Calcification of the anterior longitudinal ligament at C6-T1.  Lung apices clear.  Few scattered atherosclerotic calcifications of the left carotid system.  IMPRESSION: CT HEAD:  No acute intracranial abnormalities.  Atrophy with small vessel chronic ischemic changes of deep cerebral white matter.  CT CERVICAL SPINE:  Multilevel degenerative disc and facet disease changes of the cervical spine.  No definite acute cervical spine abnormalities.   Electronically Signed   By: Ulyses Southward M.D.   On: 12/14/2012 14:41   Ct Cervical Spine Wo Contrast  12/14/2012   CLINICAL DATA:  Fell wall talking on the phone, found on floor in bathroom, history hypertension, diabetes, Alzheimer dementia  EXAM: CT HEAD WITHOUT CONTRAST  CT CERVICAL SPINE WITHOUT CONTRAST  TECHNIQUE: Multidetector CT imaging of the head and cervical spine was performed following the standard protocol without intravenous contrast. Multiplanar CT image reconstructions of the cervical spine were also generated.  COMPARISON:  07/02/2010  FINDINGS: CT HEAD FINDINGS  Generalized atrophy.  Normal ventricular morphology.  No midline shift or mass effect.  Small vessel chronic ischemic changes of deep cerebral white matter.  No intracranial hemorrhage, mass lesion, or acute infarction.  Visualized paranasal sinuses and mastoid air cells clear.  Bones unremarkable.  CT CERVICAL SPINE FINDINGS  Inferior T1 excluded anteriorly.  Prevertebral soft tissues normal thickness.  Multilevel facet degenerative changes  of the cervical spine.  Mild  anterolisthesis these at C3-C4 and C4-C5, perhaps slightly greater at C3-C4 though this could be related to slight flexion.  Vertebral body heights maintained without fracture or additional subluxation.  Calcification of the anterior longitudinal ligament at C6-T1.  Lung apices clear.  Few scattered atherosclerotic calcifications of the left carotid system.  IMPRESSION: CT HEAD:  No acute intracranial abnormalities.  Atrophy with small vessel chronic ischemic changes of deep cerebral white matter.  CT CERVICAL SPINE:  Multilevel degenerative disc and facet disease changes of the cervical spine.  No definite acute cervical spine abnormalities.   Electronically Signed   By: Ulyses Southward M.D.   On: 12/14/2012 14:41   Dg Humerus Left  12/14/2012   CLINICAL DATA:  Left arm pain following injury  EXAM: LEFT HUMERUS - 2+ VIEW  COMPARISON:  07/02/2010  FINDINGS: No acute fracture or dislocation is noted. There are calcific changes noted along the humeral head of a degenerative nature which are stable. Degenerative changes of the acromioclavicular joint are seen.  IMPRESSION: Degenerative changes without acute abnormality.   Electronically Signed   By: Alcide Clever M.D.   On: 12/14/2012 14:19    EKG Interpretation     Ventricular Rate:  89 PR Interval:  141 QRS Duration: 78 QT Interval:  354 QTC Calculation: 431 R Axis:   78 Text Interpretation:  Sinus rhythm Probable LVH with secondary repol abnrm mild artifact at baseline limiting interpretation No significant change since last tracing            MDM   1. UTI (lower urinary tract infection)   2. Fall, initial encounter   3. Dehydration   4. Alzheimer disease   5. Hypertension   6. Arthritis   7. Hemorrhoids   8. AKI (acute kidney injury)   9. HTN (hypertension)   10. Hyponatremia   11. Leukocytosis, unspecified   12. Metabolic encephalopathy    1:53 PM 79 y.o. female who presents with unwitnessed fall which occurred this morning. The  patient reportedly was on the phone with her daughter who reports that her mother by telephone and never came back. At that time the daughter called the family friend who found the patient on the floor in the bathroom. The patient is alert and oriented x1 here. She has no specific complaints on exam. Will get screening imaging and lab work.  Will admit to hospitalist.     Junius Argyle, MD 12/14/12 [phone]

## 2012-12-14 NOTE — ED Notes (Signed)
Patient transported to CT 

## 2012-12-14 NOTE — ED Notes (Signed)
Pt stated she needed to urinate, but was unable too on bed pan did however have small bm, peri care was done and pt put in depend.

## 2012-12-14 NOTE — H&P (Addendum)
[facility] History and Physical  CENA BRUHN ZOX:[id number] DOB: [birth year] DOA: 12/14/2012  Referring physician:  Dr. Romeo Apple PCP:  Darnelle Bos, MD   Chief Complaint:  fall  HPI:  The patient is a 79 y.o. year-old female with history of HTN, DM, Alzheimer's dementia, recurrent falls, hearing impairment who presents with fall and confusion.  History obtained from close family friend who accompanied the patient and .  The patient lives alone at home and has meals delivered, medications put in pill box for her, home health to come out a couple times per week, and neighbors who check on her.  Her daughter lives in [place].  The patient was last at their baseline health several weeks ago when she had a fall which bruised her left buttock.  She has been ambulating with a rolling walker since that time.  She has some baseline confusion from her dementia but over the last one week, she has had more problems with Shuntavia Yerby term memory.  This morning, her daughter called her and she did not pick up the phone.  Ms. Madara had fallen next to her bed with her rolling walker on top of her.  Somehow, she triggered the home alarm.  A neighbor and Emergency planning/management officer found her and she was transported via EMS to the hospital.    In the ER, XR of the left humerus, left elbow, pelvis, CXR, CT head and neck were without fractures or acute injuries.  Her labs were notable for WBC 13.6, Na 132, Cl 91, BUN 45, Cr 1.16, glucose 116.  UA had SG of 1.026, large LE, 21-50 WBC, 11-20 RBC.    Review of Systems:  Limited by patient's dementia  General:  + 9-lb weight loss since June  HEENT:  Hearing and vision impairment, wears a bridge CV:  Denies chest pain  PULM:  Denies SOB, +cough.   GI:  Denies nausea, vomiting, constipation, diarrhea.   GU:  Has pain on the left side when she voids MSK:  Knees and lateral hips hurt, left elbow pain   Skin:  Left elbow is scraped NEURO: No focal weakness or facial droop  according to neighbor.  Worse confusion than baseline.  PSYCH:  Denies anxiety and depression.    Past Medical History  Diagnosis Date  . Alzheimer disease   . Hypertension   . Diabetes mellitus without complication   . Arthritis   . Hearing impaired   . Cataracts, bilateral   . Recurrent falls   . Hemorrhoids    Past Surgical History  Procedure Laterality Date  . Tonsillectomy    . Appendectomy    . Cataract extraction, bilateral     Social History:  reports that she has never smoked. She has never used smokeless tobacco. She reports that she does not drink alcohol or use illicit drugs. Lives alone and uses rolling walker.   Allergies  Allergen Reactions  . Ace Inhibitors Other (See Comments)    Reaction unknown  . Codeine Other (See Comments)    Reaction unknown   . Penicillins Other (See Comments)    Reaction unknown   . Sulfa Antibiotics Other (See Comments)    Reaction unknown   . Tramadol Other (See Comments)    Reaction unknown     Family History  Problem Relation Age of Onset  . Colon cancer Neg Hx   . Breast cancer      multiple  . Dementia Sister   . High blood pressure  multiple  . Diabetes      multiple     Prior to Admission medications   Medication Sig Start Date End Date Taking? Authorizing Provider  amLODipine-valsartan (EXFORGE) 10-160 MG per tablet Take 1 tablet by mouth daily.   Yes Historical Provider, MD  aspirin EC 81 MG tablet Take 162 mg by mouth daily.   Yes Historical Provider, MD  clotrimazole-betamethasone (LOTRISONE) cream Apply 1 application topically 2 (two) times daily.   Yes Historical Provider, MD  diclofenac sodium (VOLTAREN) 1 % GEL Apply 1 application topically 2 (two) times daily as needed (For pain.).    Yes Historical Provider, MD  hydrocortisone cream (CORTAID MAXIMUM STRENGTH) 1 % Apply 1 application topically 2 (two) times daily. Applies to affected area for rash.   Yes Historical Provider, MD  LORazepam  (ATIVAN) 0.5 MG tablet Take 0.5 mg by mouth 2 (two) times daily as needed for anxiety.   Yes Historical Provider, MD  metoprolol succinate (TOPROL-XL) 25 MG 24 hr tablet Take 12.5 mg by mouth daily.   Yes Historical Provider, MD  OVER THE COUNTER MEDICATION Take 1 each by mouth daily as needed (For mouth or throat.). Vitamin C 60mg  Drops   Yes Historical Provider, MD  OVER THE COUNTER MEDICATION Take 1 each by mouth daily as needed (For mouth or throat.). Honey Lemon Cough Drops 8%   Yes Historical Provider, MD  traMADol (ULTRAM) 50 MG tablet Take 50 mg by mouth 3 (three) times daily as needed (For buttock pain.).    Yes Historical Provider, MD   Physical Exam: Filed Vitals:   12/14/12 1254 12/14/12 1724  BP: 106/64 119/41  Pulse: 89 95  Temp: 97.7 F (36.5 C) 98.3 F (36.8 C)  TempSrc: Oral Oral  Resp: 18 18  Height:  4\' 11"  (1.499 m)  Weight:  39.5 kg (87 lb 1.3 oz)  SpO2: 99% 100%     General:  Cachectic AAF, no acute distress  Eyes:  PERRL, anicteric, non-injected.  ENT:  Nares clear.  OP clear, non-erythematous without plaques or exudates.  MMM.  Neck:  Supple without TM or JVD.    Lymph:  No cervical, supraclavicular, or submandibular LAD.  Cardiovascular:  RRR, normal S1, S2, without m/r/g.  2+ pulses, warm extremities  Respiratory:  CTA bilaterally without increased WOB.  Abdomen:  NABS.  Soft, ND/NT.    Skin:  No rashes or focal lesions.  Musculoskeletal:  Normal bulk and tone.  No LE edema.  Psychiatric:  A & O x 4.  Appropriate affect.  Neurologic:  CN 3-12 intact.  5/5 strength.  Sensation intact.  Labs on Admission:  Basic Metabolic Panel:  Recent Labs Lab 12/14/12 1420  NA 132*  K 4.7  CL 91*  CO2 23  GLUCOSE 116*  BUN 45*  CREATININE 1.16*  CALCIUM 10.0   Liver Function Tests: No results found for this basename: AST, ALT, ALKPHOS, BILITOT, PROT, ALBUMIN,  in the last 168 hours No results found for this basename: LIPASE, AMYLASE,  in the  last 168 hours No results found for this basename: AMMONIA,  in the last 168 hours CBC:  Recent Labs Lab 12/14/12 1420  WBC 13.6*  NEUTROABS 11.4*  HGB 12.3  HCT 35.7*  MCV 68.8*  PLT 336   Cardiac Enzymes: No results found for this basename: CKTOTAL, CKMB, CKMBINDEX, TROPONINI,  in the last 168 hours  BNP (last 3 results) No results found for this basename: PROBNP,  in the last 8760  hours CBG: No results found for this basename: GLUCAP,  in the last 168 hours  Radiological Exams on Admission: Dg Chest 2 View  12/14/2012   CLINICAL DATA:  Fall, shortness of breath  EXAM: CHEST  2 VIEW  COMPARISON:  12/09/2010  FINDINGS: Lungs are clear.  No pleural effusion or pneumothorax.  The heart is normal in size.  Degenerative changes of the thoracic spine and bilateral shoulders, right greater than left.  IMPRESSION: No evidence of acute cardiopulmonary disease.   Electronically Signed   By: Charline Bills M.D.   On: 12/14/2012 14:27   Dg Pelvis 1-2 Views  12/14/2012   CLINICAL DATA:  Fall, low back/ pelvic pain  EXAM: PELVIS - 1-2 VIEW  COMPARISON:  11/03/2012  FINDINGS: No fracture or dislocation is seen.  Mild degenerative changes of the bilateral hips.  Visualized bony pelvis appears intact.  IMPRESSION: No fracture or dislocation is seen.   Electronically Signed   By: Charline Bills M.D.   On: 12/14/2012 14:35   Dg Elbow Complete Left  12/14/2012   CLINICAL DATA:  Left elbow pain following injury  EXAM: LEFT ELBOW - COMPLETE 3+ VIEW  COMPARISON:  None.  FINDINGS: There is no evidence of fracture, dislocation, or joint effusion. There is no evidence of arthropathy or other focal bone abnormality. Soft tissues are unremarkable  IMPRESSION: No acute abnormality noted.   Electronically Signed   By: Alcide Clever M.D.   On: 12/14/2012 14:19   Ct Head Wo Contrast  12/14/2012   CLINICAL DATA:  Fell wall talking on the phone, found on floor in bathroom, history hypertension, diabetes,  Alzheimer dementia  EXAM: CT HEAD WITHOUT CONTRAST  CT CERVICAL SPINE WITHOUT CONTRAST  TECHNIQUE: Multidetector CT imaging of the head and cervical spine was performed following the standard protocol without intravenous contrast. Multiplanar CT image reconstructions of the cervical spine were also generated.  COMPARISON:  07/02/2010  FINDINGS: CT HEAD FINDINGS  Generalized atrophy.  Normal ventricular morphology.  No midline shift or mass effect.  Small vessel chronic ischemic changes of deep cerebral white matter.  No intracranial hemorrhage, mass lesion, or acute infarction.  Visualized paranasal sinuses and mastoid air cells clear.  Bones unremarkable.  CT CERVICAL SPINE FINDINGS  Inferior T1 excluded anteriorly.  Prevertebral soft tissues normal thickness.  Multilevel facet degenerative changes of the cervical spine.  Mild anterolisthesis these at C3-C4 and C4-C5, perhaps slightly greater at C3-C4 though this could be related to slight flexion.  Vertebral body heights maintained without fracture or additional subluxation.  Calcification of the anterior longitudinal ligament at C6-T1.  Lung apices clear.  Few scattered atherosclerotic calcifications of the left carotid system.  IMPRESSION: CT HEAD:  No acute intracranial abnormalities.  Atrophy with small vessel chronic ischemic changes of deep cerebral white matter.  CT CERVICAL SPINE:  Multilevel degenerative disc and facet disease changes of the cervical spine.  No definite acute cervical spine abnormalities.   Electronically Signed   By: Ulyses Southward M.D.   On: 12/14/2012 14:41   Ct Cervical Spine Wo Contrast  12/14/2012   CLINICAL DATA:  Fell wall talking on the phone, found on floor in bathroom, history hypertension, diabetes, Alzheimer dementia  EXAM: CT HEAD WITHOUT CONTRAST  CT CERVICAL SPINE WITHOUT CONTRAST  TECHNIQUE: Multidetector CT imaging of the head and cervical spine was performed following the standard protocol without intravenous contrast.  Multiplanar CT image reconstructions of the cervical spine were also generated.  COMPARISON:  07/02/2010  FINDINGS: CT HEAD FINDINGS  Generalized atrophy.  Normal ventricular morphology.  No midline shift or mass effect.  Small vessel chronic ischemic changes of deep cerebral white matter.  No intracranial hemorrhage, mass lesion, or acute infarction.  Visualized paranasal sinuses and mastoid air cells clear.  Bones unremarkable.  CT CERVICAL SPINE FINDINGS  Inferior T1 excluded anteriorly.  Prevertebral soft tissues normal thickness.  Multilevel facet degenerative changes of the cervical spine.  Mild anterolisthesis these at C3-C4 and C4-C5, perhaps slightly greater at C3-C4 though this could be related to slight flexion.  Vertebral body heights maintained without fracture or additional subluxation.  Calcification of the anterior longitudinal ligament at C6-T1.  Lung apices clear.  Few scattered atherosclerotic calcifications of the left carotid system.  IMPRESSION: CT HEAD:  No acute intracranial abnormalities.  Atrophy with small vessel chronic ischemic changes of deep cerebral white matter.  CT CERVICAL SPINE:  Multilevel degenerative disc and facet disease changes of the cervical spine.  No definite acute cervical spine abnormalities.   Electronically Signed   By: Ulyses Southward M.D.   On: 12/14/2012 14:41   Dg Humerus Left  12/14/2012   CLINICAL DATA:  Left arm pain following injury  EXAM: LEFT HUMERUS - 2+ VIEW  COMPARISON:  07/02/2010  FINDINGS: No acute fracture or dislocation is noted. There are calcific changes noted along the humeral head of a degenerative nature which are stable. Degenerative changes of the acromioclavicular joint are seen.  IMPRESSION: Degenerative changes without acute abnormality.   Electronically Signed   By: Alcide Clever M.D.   On: 12/14/2012 14:19    EKG: Independently reviewed. NSR, no ST-elevation or depression   Assessment/Plan Principal Problem:   UTI (lower urinary  tract infection) Active Problems:   Renal insufficiency   Fall   Dehydration   HTN (hypertension)   Alzheimer disease   Hypertension   Arthritis   Hemorrhoids   Metabolic encephalopathy   Leukocytosis, unspecified   Hyponatremia  ---  Fall, likely mechanical and orthostatic related.  XR and CTs are negative for acute fracture -  Tylenol and cold compresses -  Ultram for severe pain -  Avoid narcotics   UTI suggested by large LE and 21-50 WBC  -  Ceftriaxone  -  F/u urine culture  Metabolic encephalopathy due to UTI superimposed on dementia -  Treat dehydration and UTI  Dehydration suggested by hyponatremia, hypochloremia and very elevated BUN:Cr ration -  Continue IVF started in ER  HTN, blood pressure stable despite suspicion that she has not taken her medications in several days.  Likely has relative hypotension due to dehydration.   -  Hold BP medications -  Monitor for reflex tachycardia due to holding beta blocker  Arthritis -  Continue diclofenac -  Add capsaicin  Hemorrhoids:  Hemorrhoid cream  Leukocytosis likely due to UTI and dehydration -  Repeat in AM  Hyponatremia and hypochloremia likely due to dehydration -  Repeat in AM  AKI (baseline creatinine 0.8 and BUN 21 and currently 1.16 with BUN 45), likely due to dehydration -  Repeat in AM -  IVF   Diet:  Dysphagia 3 Access:  PIV IVF:  yes Proph:  lovenox  Code Status: DNR Family Communication: patient, family friend Meriam Sprague, and daughter Truett Perna [facility]) Disposition Plan: Admit to [facility].  Spoke at length about code status and the need for 24-hour supervision from now on.  Patient has a gun in the house so if she goes home with someone  living with her, please address getting the gun out of the house.  Will have PT/OT evaluations and SW/CM assist with possible placement.     Time spent: 60 min Renae Fickle [facility] Pager [phone]  If 7PM-7AM, please contact  night-coverage [email or web address] Password [facility] 12/14/2012, 5:51 PM

## 2012-12-15 DIAGNOSIS — K5641 Fecal impaction: Secondary | ICD-10-CM

## 2012-12-15 HISTORY — DX: Fecal impaction: K56.41

## 2012-12-15 LAB — CBC
MCH: 23.8 pg — ABNORMAL LOW (ref 26.0–34.0)
MCV: 69.7 fL — ABNORMAL LOW (ref 78.0–100.0)
Platelets: 258 10*3/uL (ref 150–400)
RDW: 15.9 % — ABNORMAL HIGH (ref 11.5–15.5)
WBC: 11.2 10*3/uL — ABNORMAL HIGH (ref 4.0–10.5)

## 2012-12-15 LAB — GLUCOSE, CAPILLARY
Glucose-Capillary: 48 mg/dL — ABNORMAL LOW (ref 70–99)
Glucose-Capillary: 154 mg/dL — ABNORMAL HIGH (ref 70–99)

## 2012-12-15 LAB — BASIC METABOLIC PANEL
CO2: 21 mEq/L (ref 19–32)
Calcium: 8.8 mg/dL (ref 8.4–10.5)
Creatinine, Ser: 0.98 mg/dL (ref 0.50–1.10)
GFR calc non Af Amer: 48 mL/min — ABNORMAL LOW (ref >90)
Glucose, Bld: 61 mg/dL — ABNORMAL LOW (ref 70–99)

## 2012-12-15 MED ORDER — DEXTROSE 5 % IV SOLN
1.0000 g | INTRAVENOUS | Status: DC
  Administered 2012-12-15 – 2012-12-17 (×3): 1 g via INTRAVENOUS
  Filled 2012-12-15 (×4): qty 10

## 2012-12-15 MED ORDER — ENSURE COMPLETE PO LIQD
237.0000 mL | Freq: Two times a day (BID) | ORAL | Status: DC
  Administered 2012-12-15 – 2012-12-18 (×7): 237 mL via ORAL

## 2012-12-15 NOTE — Progress Notes (Signed)
Patient is active with [facility] Care Management services. At bedside to speak with patient and daughter at bedside. Mrs Amey confused at visit. Spoke with her daughter, Dr Arlys John who states she prefers SNF at discharge. Currently patient lives alone and has an aide that comes 3 times a week. Daughter reports she lives in [place] and was with her mother previously for 19 days before she left to go home on last Wednesday. The daughter's cell number is [phone]. Will make inpatient RNCM aware of conversation.  Raiford Noble, MSN- Ed, RN,BSN- [facility] Liaison [phone]

## 2012-12-15 NOTE — Progress Notes (Signed)
Clinical Social Work Department BRIEF PSYCHOSOCIAL ASSESSMENT 12/15/2012  Patient:  [name] Ramos, [name] Ramos     Account Number:  [id number]     Admit date:  12/14/2012  Clinical Social Worker:  Jacelyn Grip  Date/Time:  12/15/2012 02:00 PM  Referred by:  Physician  Date Referred:  12/15/2012 Referred for  SNF Placement   Other Referral:   Interview type:  Family Other interview type:    PSYCHOSOCIAL DATA Living Status:  ALONE Admitted from facility:   Level of care:   Primary support name:  [name] Ramos/daughter/[phone] Primary support relationship to patient:  CHILD, ADULT Degree of support available:   adequate    CURRENT CONCERNS Current Concerns  Post-Acute Placement   Other Concerns:    SOCIAL WORK ASSESSMENT / PLAN CSW reviewed chart and noted pt from home alone with history of Alzheimer's dementia and admitted with UTI. CSW noted PT/OT evaluation recommendation for SNF.    CSW met with pt and pt daughter at bedside. Pt alert and oriented to person only. Pt daughter lives in [place], but came to [place] last night after receiving notification that pt being admitted to the hospital. CSW discussed recommendation for SNF at discharge and pt daughter agreeable to SNF placement and would like for pt to remain in [facility]. CSW discussed SNF process and provided SNF list for [facility].    CSW completed FL2 and initiated SNF search to [facility].    CSW to follow up with pt daughter re: bed offers.    CSW to continue to follow and facilitate pt discharge needs when pt medically ready for discharge.   Assessment/plan status:  Psychosocial Support/Ongoing Assessment of Needs Other assessment/ plan:   discharge planning   Information/referral to community resources:   [facility] list    PATIENT'S/FAMILY'S RESPONSE TO PLAN OF CARE: Pt alert and oriented to person only. Pt daughter appears supportive and involved in pt care.  Pt daughter feels that SNF will be most appropriate venue for pt following hospital stay.    [name] Ramos, MSW, LCSWA  Clinical Social Work [phone]

## 2012-12-15 NOTE — Progress Notes (Signed)
[facility] PROGRESS NOTE  [name] Ramos NFA:[id number] DOB: October 14, 1916 DOA: 12/14/2012 PCP: Darnelle Bos, MD  Brief narrative: [name] Ramos is an 79 y.o. female with PMH of hypertension, diabetes, Alzheimer's dementia, recurrent falls, and hearing impairment who was admitted on 12/14/2012 with confusion and fall at home (lives alone).  Assessment/Plan: Principal Problem:   Metabolic encephalopathy due to UTI superimposed on dementia -CT of the head unremarkable for acute etiology.  -Treating UTI with ceftriaxone. -Avoid narcotics and other sedating medications. -Nursing staff to provide frequent reorientation. -PT/OT evaluations. Active Problems:   Fecal impaction -Disimpacted by nursing staff. Continue Senokot.   Renal insufficiency/AKI secondary to dehydration -Creatinine improved with IV fluids.   Fall -Multiple x-rays done on admission, negative for fracture. -PT/OT evaluations requested.   UTI (lower urinary tract infection) -Follow up urine culture. Continue empiric Rocephin.   HTN (hypertension) -Antihypertensives currently on hold.   Alzheimer disease -Not on routine medications.   Arthritis -Voltaren Gel as needed. Avoid narcotics.   Hemorrhoids -Hemorrhoid cream ordered.   Leukocytosis, unspecified -Likely secondary to UTI and dehydration. Decreasing with antibiotics and IV fluids.   Hyponatremia -Likely from dehydration. Improving with IV fluids.  Code Status: DNR Family Communication: No family at the bedside.  Dr. Jory Sims [phone]) updated by telephone.  Disposition Plan: Home versus SNF depending on progress.   Medical Consultants:  None.  Other Consultants:  Physical therapy  Occupational therapy  Anti-infectives:  Rocephin 12/14/2012--->  HPI/Subjective: [name] Ramos   Objective: Filed Vitals:   12/14/12 1254 12/14/12 1724 12/14/12 2126 12/15/12 0552  BP: 106/64 119/41 130/41 138/47  Pulse: 89 95  81 76  Temp: 97.7 F (36.5 C) 98.3 F (36.8 C) 98.4 F (36.9 C) 98.3 F (36.8 C)  TempSrc: Oral Oral Oral Oral  Resp: 18 18 16 18   Height:  4\' 11"  (1.499 m)    Weight:  39.5 kg (87 lb 1.3 oz)    SpO2: 99% 100% 100% 98%   No intake or output data in the 24 hours ending 12/15/12 0933  Exam: Gen:  NAD Cardiovascular:  RRR, No M/R/G Respiratory:  Lungs CTAB Gastrointestinal:  Abdomen soft, NT/ND, + BS Extremities:  No C/E/C  Data Reviewed: Basic Metabolic Panel:  Recent Labs Lab 12/14/12 1420 12/15/12 0400  NA 132* 134*  K 4.7 4.2  CL 91* 98  CO2 23 21  GLUCOSE 116* 61*  BUN 45* 32*  CREATININE 1.16* 0.98  CALCIUM 10.0 8.8   GFR Estimated Creatinine Clearance: 21.9 ml/min (by C-G formula based on Cr of 0.98).  CBC:  Recent Labs Lab 12/14/12 1420 12/15/12 0400  WBC 13.6* 11.2*  NEUTROABS 11.4*  --   HGB 12.3 10.6*  HCT 35.7* 31.1*  MCV 68.8* 69.7*  PLT 336 258   Microbiology No results found for this or any previous visit (from the past 240 hour(s)).   Procedures and Diagnostic Studies: Dg Chest 2 View  12/14/2012   CLINICAL DATA:  Fall, shortness of breath  EXAM: CHEST  2 VIEW  COMPARISON:  12/09/2010  FINDINGS: Lungs are clear.  No pleural effusion or pneumothorax.  The heart is normal in size.  Degenerative changes of the thoracic spine and bilateral shoulders, right greater than left.  IMPRESSION: No evidence of acute cardiopulmonary disease.   Electronically Signed   By: Charline Bills M.D.   On: 12/14/2012 14:27   Dg Pelvis 1-2 Views  12/14/2012   CLINICAL DATA:  Fall, low back/ pelvic pain  EXAM: PELVIS - 1-2 VIEW  COMPARISON:  11/03/2012  FINDINGS: No fracture or dislocation is seen.  Mild degenerative changes of the bilateral hips.  Visualized bony pelvis appears intact.  IMPRESSION: No fracture or dislocation is seen.   Electronically Signed   By: Charline Bills M.D.   On: 12/14/2012 14:35   Dg Elbow Complete Left  12/14/2012   CLINICAL  DATA:  Left elbow pain following injury  EXAM: LEFT ELBOW - COMPLETE 3+ VIEW  COMPARISON:  None.  FINDINGS: There is no evidence of fracture, dislocation, or joint effusion. There is no evidence of arthropathy or other focal bone abnormality. Soft tissues are unremarkable  IMPRESSION: No acute abnormality noted.   Electronically Signed   By: Alcide Clever M.D.   On: 12/14/2012 14:19   Ct Head Wo Contrast  12/14/2012   CLINICAL DATA:  Fell wall talking on the phone, found on floor in bathroom, history hypertension, diabetes, Alzheimer dementia  EXAM: CT HEAD WITHOUT CONTRAST  CT CERVICAL SPINE WITHOUT CONTRAST  TECHNIQUE: Multidetector CT imaging of the head and cervical spine was performed following the standard protocol without intravenous contrast. Multiplanar CT image reconstructions of the cervical spine were also generated.  COMPARISON:  07/02/2010  FINDINGS: CT HEAD FINDINGS  Generalized atrophy.  Normal ventricular morphology.  No midline shift or mass effect.  Small vessel chronic ischemic changes of deep cerebral white matter.  No intracranial hemorrhage, mass lesion, or acute infarction.  Visualized paranasal sinuses and mastoid air cells clear.  Bones unremarkable.  CT CERVICAL SPINE FINDINGS  Inferior T1 excluded anteriorly.  Prevertebral soft tissues normal thickness.  Multilevel facet degenerative changes of the cervical spine.  Mild anterolisthesis these at C3-C4 and C4-C5, perhaps slightly greater at C3-C4 though this could be related to slight flexion.  Vertebral body heights maintained without fracture or additional subluxation.  Calcification of the anterior longitudinal ligament at C6-T1.  Lung apices clear.  Few scattered atherosclerotic calcifications of the left carotid system.  IMPRESSION: CT HEAD:  No acute intracranial abnormalities.  Atrophy with small vessel chronic ischemic changes of deep cerebral white matter.  CT CERVICAL SPINE:  Multilevel degenerative disc and facet disease  changes of the cervical spine.  No definite acute cervical spine abnormalities.   Electronically Signed   By: Ulyses Southward M.D.   On: 12/14/2012 14:41   Ct Cervical Spine Wo Contrast  12/14/2012   CLINICAL DATA:  Fell wall talking on the phone, found on floor in bathroom, history hypertension, diabetes, Alzheimer dementia  EXAM: CT HEAD WITHOUT CONTRAST  CT CERVICAL SPINE WITHOUT CONTRAST  TECHNIQUE: Multidetector CT imaging of the head and cervical spine was performed following the standard protocol without intravenous contrast. Multiplanar CT image reconstructions of the cervical spine were also generated.  COMPARISON:  07/02/2010  FINDINGS: CT HEAD FINDINGS  Generalized atrophy.  Normal ventricular morphology.  No midline shift or mass effect.  Small vessel chronic ischemic changes of deep cerebral white matter.  No intracranial hemorrhage, mass lesion, or acute infarction.  Visualized paranasal sinuses and mastoid air cells clear.  Bones unremarkable.  CT CERVICAL SPINE FINDINGS  Inferior T1 excluded anteriorly.  Prevertebral soft tissues normal thickness.  Multilevel facet degenerative changes of the cervical spine.  Mild anterolisthesis these at C3-C4 and C4-C5, perhaps slightly greater at C3-C4 though this could be related to slight flexion.  Vertebral body heights maintained without fracture or additional subluxation.  Calcification of the anterior longitudinal ligament at C6-T1.  Lung apices clear.  Few scattered atherosclerotic calcifications of the left carotid system.  IMPRESSION: CT HEAD:  No acute intracranial abnormalities.  Atrophy with small vessel chronic ischemic changes of deep cerebral white matter.  CT CERVICAL SPINE:  Multilevel degenerative disc and facet disease changes of the cervical spine.  No definite acute cervical spine abnormalities.   Electronically Signed   By: Ulyses Southward M.D.   On: 12/14/2012 14:41   Dg Humerus Left  12/14/2012   CLINICAL DATA:  Left arm pain following  injury  EXAM: LEFT HUMERUS - 2+ VIEW  COMPARISON:  07/02/2010  FINDINGS: No acute fracture or dislocation is noted. There are calcific changes noted along the humeral head of a degenerative nature which are stable. Degenerative changes of the acromioclavicular joint are seen.  IMPRESSION: Degenerative changes without acute abnormality.   Electronically Signed   By: Alcide Clever M.D.   On: 12/14/2012 14:19    Scheduled Meds: . capsaicin   Topical BID  . cefTRIAXone (ROCEPHIN)  IV  1 g Intravenous Q24H  . clotrimazole   Topical BID  . diclofenac sodium  4 g Topical BID  . docusate sodium  100 mg Oral BID  . enoxaparin (LOVENOX) injection  30 mg Subcutaneous Q24H  . mupirocin ointment   Topical BID  . senna  1 tablet Oral BID   Continuous Infusions: . sodium chloride      Time spent: 35 minutes with > 50% of time discussing current diagnostic test results, clinical impression and plan of care with the patient's daughter.    LOS: 1 day   [name] Ramos  [facility] Pager [phone].   *Please note that the hospitalists switch teams on Wednesdays. Please call the flow manager at [phone] if you are having difficulty reaching the hospitalist taking care of this patient as she can update you and provide the most up-to-date pager number of provider caring for the patient. If 8PM-8AM, please contact night-coverage at [email or web address], password [email or web address]  12/15/2012, 9:33 AM    Information printed out, explained, and given to the patient:  In an effort to keep you and your family informed about your hospital stay, I am providing you with this information sheet. If you or your family have any questions, please do not hesitate to have the nursing staff page me to set up a meeting time.  [name] Ramos 12/15/2012 1 (Number of days in the hospital)  Treatment team:  Dr. Hillery Aldo, Hospitalist (Internist)  Roxy Manns (case manager)  Active Treatment Issues with Plan: Principal  Problem:   Urinary tract infection -Treating with antibiotics (Ceftriaxone). -Urine cultures were sent (not back yet). Active Problems:   Dehydration -Improved with IV fluids.   Fall -Multiple x-rays done on admission, negative for fracture. -The physical therapist will evaluate your strength and balance while you were here.   History of high blood pressure -Blood pressure medicines currently on hold. Latest blood pressure 138/47.   Arthritis -Voltaren Gel as needed. Avoid narcotics.   Hemorrhoids -Hemorrhoid cream ordered.  Significant Lab results: WBC 13.6 ---> 11.2 (infection fighting cells)  Significant diagnostic study results: CT of the head and neck: No fractures.  No acute findings. Xrays of the chest, pelvis, left arm and elbow: No fractures.  Anticipated discharge date: Depends on progress.

## 2012-12-15 NOTE — Evaluation (Signed)
Occupational Therapy Evaluation Patient Details Name: [name] Ramos MRN: [id number] DOB: [birth year] Today's Date: 12/15/2012 Time: 8295-6213 OT Time Calculation (min): 38 min  OT Assessment / Plan / Recommendation History of present illness The patient is a 79 y.o. year-old female with history of HTN, DM, Alzheimer's dementia, recurrent falls, hearing impairment who presents with fall and confusion.   Clinical Impression  Pt was admitted for the above.  She will benefit from continued OT to increase safety and independence with adls.  Goals in acute are from supervision to mod A level.      OT Assessment  Patient needs continued OT Services    Follow Up Recommendations  SNF;Supervision/Assistance - 24 hour    Barriers to Discharge      Equipment Recommendations  3 in 1 bedside comode    Recommendations for Other Services    Frequency  Min 2X/week    Precautions / Restrictions Precautions Precautions: Fall Restrictions Weight Bearing Restrictions: No   Pertinent Vitals/Pain C/o pain in bil LEs when PT assessed motion; no pain in standing.Not rated.  Repositioned in chair.   BP supine 130/38  Sitting 116/50.  Sats 100% RA    ADL  Grooming: Wash/dry face;Set up Where Assessed - Grooming: Supine, head of bed up Upper Body Bathing: Minimal assistance Where Assessed - Upper Body Bathing: Supported sitting Lower Body Bathing: max A Lower Body Bathing: Patient Percentage: 30% Where Assessed - Lower Body Bathing: Unsupported sit to stand Upper Body Dressing: Minimal assistance Where Assessed - Upper Body Dressing: Supported sitting Lower Body Dressing: +1 Total assistance Where Assessed - Lower Body Dressing: Supported sit to Pharmacist, hospital: +2 Total assistance for safety Toilet Transfer: Patient Percentage: 70% Toilet Transfer Method: Surveyor, minerals: Materials engineer and Hygiene: +2 Total assistance Toileting  - Clothing Manipulation and Hygiene: Patient Percentage: 0% Equipment Used: Rolling walker Transfers/Ambulation Related to ADLs: pt with loose stool; had been disimpacted and was able to tolerate OT/PT. Pt does not stand fully and sits prematurely ADL Comments: pt unable to reach to feet; had been living alone with meals on wheels prior to admission    OT Diagnosis: Generalized weakness  OT Problem List: Decreased strength;Decreased activity tolerance;Impaired balance (sitting and/or standing);Decreased cognition;Decreased safety awareness;Pain OT Treatment Interventions: Self-care/ADL training;Patient/family education;Balance training;Therapeutic activities;Cognitive remediation/compensation;DME and/or AE instruction   OT Goals(Current goals can be found in the care plan section) Acute Rehab OT Goals Patient Stated Goal: none stated OT Goal Formulation: With patient Time For Goal Achievement: 12/29/12 Potential to Achieve Goals: Good ADL Goals Pt Will Transfer to Toilet: with min assist;bedside commode;ambulating Pt Will Perform Toileting - Clothing Manipulation and hygiene: with min assist;sit to/from stand Additional ADL Goal #1: pt will complete ub adls with supervision/set up unsupported sititng Additional ADL Goal #2: pt will complete lb adls with mod A, sit to stand  Visit Information  Last OT Received On: 12/15/12 Assistance Needed: +2 Reason Eval/Treat Not Completed: Other (comment) History of Present Illness: The patient is a 79 y.o. year-old female with history of HTN, DM, Alzheimer's dementia, recurrent falls, hearing impairment who presents with fall and confusion.       Prior Functioning     Home Living Family/patient expects to be discharged to:: Private residence Living Arrangements: Alone Additional Comments: will need 24/7 vs. snf Prior Function Comments: pt HOH and with dementia, difficult to obtain prior functional level from her Communication Communication:  [facility]  Vision/Perception Vision - History Patient Visual Report:  (pt reports she wears glasses all the time; not present)   Cognition  Cognition Arousal/Alertness: Awake/alert Behavior During Therapy:  (tearful at times) Overall Cognitive Status: No family/caregiver present to determine baseline cognitive functioning    Extremity/Trunk Assessment Upper Extremity Assessment Upper Extremity Assessment: RUE deficits/detail;LUE deficits/detail RUE Deficits / Details: able to flex shoulder to 90; grip 4/5 LUE Deficits / Details: L shoulder is painful.  Able to lift to 70 actively; aarom to 90 but painful.  grip 4/5.  Pt has bil tremor   Cervical / Trunk Assessment Cervical / Trunk Assessment: Kyphotic     Mobility Bed Mobility Bed Mobility: Supine to Sit Supine to Sit: 2: Mod assist Details for Bed Mobility Assistance: pt 50%, assist to elevate trunk and to initiate movement Transfers Sit to Stand: 4: Min assist Stand to Sit: 4: Min assist Details for Transfer Assistance: pt 75 %; SPT x 2 bed to 3in1, then to recliner with RW. Assist to guide hips, VCs for safety and hand placement. Diffuculty giving instructions 2* pt very HOH. Pt incontinent of bowels multiple times during tx.      Exercise     Balance Balance Balance Assessed: Yes Static Sitting Balance Static Sitting - Balance Support: Bilateral upper extremity supported;Feet supported Static Sitting - Level of Assistance: 5: Stand by assistance Static Sitting - Comment/# of Minutes: 3   End of Session OT - End of Session Activity Tolerance: Patient tolerated treatment well Patient left: in chair;with call bell/phone within reach;with chair alarm set  GO     Feliberto Stockley 12/15/2012, 11:11 AM Marica Otter, OTR/L [phone] 12/15/2012

## 2012-12-15 NOTE — Care Management Note (Signed)
   CARE MANAGEMENT NOTE 12/15/2012  Patient:  [name] Ramos, [name] Ramos   Account Number:  [id number]  Date Initiated:  12/15/2012  Documentation initiated by:  Thurma Priego  Subjective/Objective Assessment:   79 yo female admitted with UTI.PCP:  Darnelle Bos, MD     Action/Plan:   SNF   Anticipated DC Date:     Anticipated DC Plan:  SKILLED NURSING FACILITY  In-house referral  Clinical Social Worker      DC Planning Services  CM consult      Choice offered to / List presented to:  NA   DME arranged  NA      DME agency  NA     HH arranged  NA      HH agency  NA   Status of service:  Completed, signed off Medicare Important Message given?   (If response is "NO", the following Medicare IM given date fields will be blank) Date Medicare IM given:   Date Additional Medicare IM given:    Discharge Disposition:    Per UR Regulation:  Reviewed for med. necessity/level of care/duration of stay  If discussed at Long Length of Stay Meetings, dates discussed:    Comments:  12/15/12 1429 Delano Scardino,RN,MSN [phone] Chart reviewed for utilization of services. Pt disposition to SNF. No other needs identified at this time. CSW to follow.

## 2012-12-15 NOTE — Progress Notes (Signed)
[facility]  Patient Status: Active (receiving services up to time of hospitalization)  [facility] is providing the following services: RN, PT and HHA.  Home Health RN is concerned with patient's safety in the home.  Pt lives alone and has adequate food and drink, but she has chronic hip pain and is reluctant to get up, leading to not eating or drinking as often as needed.   If patient discharges after hours, please call [phone].   Lanae Crumbly 12/15/2012, 2:34 PM

## 2012-12-15 NOTE — Progress Notes (Addendum)
Clinical Social Work Department CLINICAL SOCIAL WORK PLACEMENT NOTE 12/15/2012  Patient:  [name] Ramos, [name] Ramos  Account Number:  [id number] Admit date:  12/14/2012  Clinical Social Worker:  Jacelyn Grip  Date/time:  12/15/2012 03:00 PM  Clinical Social Work is seeking post-discharge placement for this patient at the following level of care:   SKILLED NURSING   (*CSW will update this form in Epic as items are completed)   12/15/2012  Patient/family provided with [facility] Department of Clinical Social Work's list of facilities offering this level of care within the geographic area requested by the patient (or if unable, by the patient's family).  12/15/2012  Patient/family informed of their freedom to choose among providers that offer the needed level of care, that participate in Medicare, Medicaid or managed care program needed by the patient, have an available bed and are willing to accept the patient.  12/15/2012  Patient/family informed of [facility]' ownership interest in [facility], as well as of the fact that they are under no obligation to receive care at this facility.  PASARR submitted to EDS on 12/15/2012 PASARR number received from EDS on 12/15/2012  FL2 transmitted to all facilities in geographic area requested by pt/family on  12/15/2012 FL2 transmitted to all facilities within larger geographic area on   Patient informed that his/her managed care company has contracts with or will negotiate with  certain facilities, including the following:     Patient/family informed of bed offers received:  12/16/2012 Patient chooses bed at  Physician recommends and patient chooses bed at    Patient to be transferred to  on   Patient to be transferred to facility by   The following physician request were entered in Epic:   Additional Comments:   Jacklynn Lewis, MSW, [facility]  Clinical Social Work [phone]

## 2012-12-15 NOTE — Progress Notes (Signed)
OT Cancellation Note  Patient Details Name: [name] Ramos MRN: [id number] DOB: [birth year]   Cancelled Treatment:    Reason Eval/Treat Not Completed: Other (comment)  Spoke to RN:  Pt not ready for OT right now--she will page me if pt can tolerate, otherwise, I'll check on her tomorrow.    Halayna Blane 12/15/2012, 9:39 AM Marica Otter, OTR/L [phone] 12/15/2012

## 2012-12-15 NOTE — Progress Notes (Signed)
INITIAL NUTRITION ASSESSMENT  Pt meets criteria for severe MALNUTRITION in the context of chronic illness as evidenced by <75% estimated energy intake in the past month with 9.4% weight loss in the past 4 months in addition to pt with visible severe muscle wasting and subcutaneous fat loss in legs.  DOCUMENTATION CODES Per approved criteria  -Severe malnutrition in the context of chronic illness -Underweight   INTERVENTION: - Ensure Complete BID - Encouraged increased PO intake - Will continue to monitor  NUTRITION DIAGNOSIS: Increased nutrient needs related to underweight as evidenced by BMI of 17.58 kg/(m^2).   Goal: Pt to consume >90% of meals/supplements  Monitor:  Weights, labs, intake  Reason for Assessment: Nutrition risk, low braden  79 y.o. female  Admitting Dx: Metabolic encephalopathy  ASSESSMENT: Pt discussed during multidisciplinary rounds.   Pt from home, lives alone, weighs 87 pounds. Hx of Alzheimer's disease, HTN, DM, and arthritis. Admitted with a fall. Met with pt who reports eating at least 1 good meal/day and says she usually has something small like cereal for breakfast. Denies any problems chewing/swallowing and is unsure if she has had any changes in her weight. Per nutrition screening on admission, pt has lost 9 pounds since June 2014. Per conversation with RN, family friend came to visit yesterday and stated she is the one who brings meals to pt, and pt stated this is from [employer]. RN noted pt had some coughing after drinking juice this morning. Pt ate 50% of breakfast this morning.   Nutrition Focused Physical Exam:  Subcutaneous Fat:  Orbital Region: mild/moderate wasting Upper Arm Region: mild/moderate wasting Thoracic and Lumbar Region: mild/moderate wasting  Muscle:  Temple Region: mild/moderate wasting Clavicle Bone Region: mild/moderate wasting Clavicle and Acromion Bone Region: mild/moderate wasting Scapular Bone Region:  NA Dorsal Hand: mild/moderate wasting Patellar Region: severe wasting Anterior Thigh Region: severe wasting Posterior Calf Region: severe wasting  Edema: None noted   Height: Ht Readings from Last 1 Encounters:  12/14/12 4\' 11"  (1.499 m)    Weight: Wt Readings from Last 1 Encounters:  12/14/12 87 lb 1.3 oz (39.5 kg)    Ideal Body Weight: 98 lb  % Ideal Body Weight: 89%  Wt Readings from Last 10 Encounters:  12/14/12 87 lb 1.3 oz (39.5 kg)    Usual Body Weight: 96 lb  % Usual Body Weight: 91%  BMI:  Body mass index is 17.58 kg/(m^2). Underweight  Estimated Nutritional Needs: Kcal: 1200-1400 Protein: 50-60g Fluid: 1.2-1.4L/day  Skin: Left elbow skin tear  Diet Order: Dysphagia 3, thin  EDUCATION NEEDS: -No education needs identified at this time  No intake or output data in the 24 hours ending 12/15/12 1013  Last BM: 10/27  Labs:   Recent Labs Lab 12/14/12 1420 12/15/12 0400  NA 132* 134*  K 4.7 4.2  CL 91* 98  CO2 23 21  BUN 45* 32*  CREATININE 1.16* 0.98  CALCIUM 10.0 8.8  GLUCOSE 116* 61*    CBG (last 3)   Recent Labs  12/15/12 0824 12/15/12 0852 12/15/12 1000  GLUCAP 53* 52* 154*    Scheduled Meds: . capsaicin   Topical BID  . cefTRIAXone (ROCEPHIN)  IV  1 g Intravenous Q24H  . clotrimazole   Topical BID  . diclofenac sodium  4 g Topical BID  . docusate sodium  100 mg Oral BID  . enoxaparin (LOVENOX) injection  30 mg Subcutaneous Q24H  . mupirocin ointment   Topical BID  . senna  1 tablet Oral BID    Continuous Infusions: . sodium chloride      Past Medical History  Diagnosis Date  . Alzheimer disease   . Hypertension   . Diabetes mellitus without complication   . Arthritis   . Hearing impaired   . Cataracts, bilateral   . Recurrent falls   . Hemorrhoids     Past Surgical History  Procedure Laterality Date  . Tonsillectomy    . Appendectomy    . Cataract extraction, bilateral       Levon Hedger MS, RD,  LDN [phone] Pager [phone] After Hours Pager

## 2012-12-15 NOTE — Evaluation (Addendum)
Physical Therapy Evaluation Patient Details Name: [name] Ramos MRN: [id number] DOB: 17-Jan-1917 Today's Date: 12/15/2012 Time: 1020-1055 PT Time Calculation (min): 35 min  PT Assessment / Plan / Recommendation History of Present Illness  The patient is a 79 y.o. year-old female with history of HTN, DM, Alzheimer's dementia, recurrent falls, hearing impairment who presents with fall and confusion.  Clinical Impression  *Pt admitted with UTI, fall, confusion, dehydration*. Pt currently with functional limitations due to the deficits listed below (see PT Problem List).  Pt will benefit from skilled PT to increase their independence and safety with mobility to allow discharge to the venue listed below.   **    PT Assessment  Patient needs continued PT services    Follow Up Recommendations  SNF;Supervision/Assistance - 24 hour (SNF if family cannot provide 24* assist)    Does the patient have the potential to tolerate intense rehabilitation      Barriers to Discharge        Equipment Recommendations  None recommended by PT    Recommendations for Other Services     Frequency Min 3X/week    Precautions / Restrictions Precautions Precautions: Fall Restrictions Weight Bearing Restrictions: No   Pertinent Vitals/Pain *BP 130/38 supine; 116/50 sitting -no c/o dizziness SaO2 100% on RA HR 83 Reported R hip soreness with activity, RN aware**      Mobility  Bed Mobility Bed Mobility: Supine to Sit Supine to Sit: 2: Max assist Details for Bed Mobility Assistance: pt 50%, assist to elevate trunk and to initiate movement Transfers Transfers: Sit to Stand;Stand to Sit;Stand Pivot Transfers Sit to Stand: 4: Min assist Stand to Sit: 4: Min assist Stand Pivot Transfers: 4: Min assist Details for Transfer Assistance: pt 70 %; SPT x 2 bed to 3in1, then to recliner with RW. Assist to guide hips, VCs for safety and hand placement. Diffuculty giving instructions 2* pt very HOH. Pt  incontinent of bowels multiple times during tx.  Ambulation/Gait Ambulation/Gait Assistance: Not tested (comment)    Exercises     PT Diagnosis: Generalized weakness  PT Problem List: Decreased activity tolerance;Decreased mobility;Decreased safety awareness PT Treatment Interventions: Gait training;Functional mobility training;Therapeutic exercise;Therapeutic activities     PT Goals(Current goals can be found in the care plan section) Acute Rehab PT Goals Patient Stated Goal: none stated PT Goal Formulation: Patient unable to participate in goal setting Time For Goal Achievement: 12/29/12  Visit Information  Last PT Received On: 12/15/12 Assistance Needed: +2 History of Present Illness: The patient is a 80 y.o. year-old female with history of HTN, DM, Alzheimer's dementia, recurrent falls, hearing impairment who presents with fall and confusion.       Prior Functioning  Home Living Family/patient expects to be discharged to:: Private residence Living Arrangements: Alone Prior Function Comments: pt HOH and with dementia, difficult to obtain prior functional level from her Communication Communication: [facility]  Per H&P pt used a walker and had meals brought in.    Cognition  Cognition Arousal/Alertness: Awake/alert Overall Cognitive Status: No family/caregiver present to determine baseline cognitive functioning    Extremity/Trunk Assessment Lower Extremity Assessment Lower Extremity Assessment: Overall WFL for tasks assessed Cervical / Trunk Assessment Cervical / Trunk Assessment: Kyphotic   Balance Balance Balance Assessed: Yes Static Sitting Balance Static Sitting - Balance Support: Bilateral upper extremity supported;Feet supported Static Sitting - Level of Assistance: 5: Stand by assistance Static Sitting - Comment/# of Minutes: 3  End of Session PT - End of Session Activity Tolerance:  Patient tolerated treatment well Patient left: in chair;with call bell/phone within  reach;with chair alarm set Nurse Communication: Mobility status  GP     Ralene Bathe Kistler 12/15/2012, 11:06 AM [phone]

## 2012-12-16 DIAGNOSIS — E43 Unspecified severe protein-calorie malnutrition: Secondary | ICD-10-CM | POA: Diagnosis present

## 2012-12-16 HISTORY — DX: Unspecified severe protein-calorie malnutrition: E43

## 2012-12-16 LAB — BASIC METABOLIC PANEL
BUN: 17 mg/dL (ref 6–23)
Calcium: 8.2 mg/dL — ABNORMAL LOW (ref 8.4–10.5)
Creatinine, Ser: 0.71 mg/dL (ref 0.50–1.10)
GFR calc Af Amer: 83 mL/min — ABNORMAL LOW (ref >90)
GFR calc non Af Amer: 72 mL/min — ABNORMAL LOW (ref >90)

## 2012-12-16 LAB — CBC
HCT: 30.5 % — ABNORMAL LOW (ref 36.0–46.0)
MCH: 23.4 pg — ABNORMAL LOW (ref 26.0–34.0)
MCHC: 33.4 g/dL (ref 30.0–36.0)
MCV: 70 fL — ABNORMAL LOW (ref 78.0–100.0)
RDW: 16.4 % — ABNORMAL HIGH (ref 11.5–15.5)

## 2012-12-16 NOTE — Progress Notes (Signed)
CSW met with pt and pt daughter at bedside re: SNF placement.  CSW provided SNF bed offers to pt daughter.  Pt daughter plans to tour facilities this afternoon in order to make decision for SNF.  CSW to follow up with pt re: decision for SNF placement.  Per MD, pt not yet medically ready for discharge.  CSW to facilitate pt discharge needs when pt medically ready for discharge.  Jacklynn Lewis, MSW, LCSWA  Clinical Social Work [phone]

## 2012-12-16 NOTE — Progress Notes (Signed)
[facility] PROGRESS NOTE  [name] Ramos ZOX:[id number] DOB: May 07, 1916 DOA: 12/14/2012 PCP: Darnelle Bos, MD  Assessment/Plan: 1. Functional decline/failure to thrive. Likely secondary to underlying infectious process in setting of dementia. She was also likely dehydrated. Patient receiving IV fluids and IV antibiotics. Physical therapy has been consult, planning to discharge to skilled nursing facility for rehabilitation. 2. Urinary tract infection. She was started on ceftriaxone 1 g IV every 24 hours, urine culture unavailable at time of this dictation. 3. Dehydration. Presented with electrolyte abnormalities, mild acute renal failure, was administered IV fluids, now tolerating by mouth intake. 4.  Metabolic encephalopathy. Likely secondary to urinary tract infection in setting of underlying dementia. She appears to be at her baseline now.  Code Status: DO NOT RESUSCITATE Family Communication:  Disposition Plan: Plan to discharge to skilled nursing facility for rehabilitation   Consultants:  Physical therapy  Social work  Antibiotics:  Ceftriaxone 1 g IV every 24 hours started on 03/16/2012  HPI/Subjective: Patient is a pleasant 79 year old female with a history of cognitive impairment, recurrent falls, who was admitted to the medicine service on 12/14/2012. She was found on the bedroom floor by a neighbor and police officer, found down next her her bed with her rolling walker on top of her. Initial lab work showed the presence of urinary tract infection, started on ceftriaxone. This morning she reports doing well, has no complaints for me. Patient is tolerating by mouth intake, plan to transfer to skilled nursing facility.   Objective: Filed Vitals:   12/16/12 0600  BP: 136/41  Pulse: 77  Temp: 98.3 F (36.8 C)  Resp: 20    Intake/Output Summary (Last 24 hours) at 12/16/12 1443 Last data filed at 12/16/12 0600  Gross per 24 hour  Intake   1180 ml   Output      0 ml  Net   1180 ml   Filed Weights   12/14/12 1724  Weight: 39.5 kg (87 lb 1.3 oz)    Exam:   General:  Pleasant, cooperative, no acute distress. She has no complaints for me today.  Cardiovascular: Regular rate rhythm normal S1-S2  Respiratory: Normal respiratory effort, lungs are clear to auscultation  Abdomen: Soft nontender nondistended positive bowel  Musculoskeletal: Present range of motion to all extremities  Data Reviewed: Basic Metabolic Panel:  Recent Labs Lab 12/14/12 1420 12/15/12 0400 12/16/12 0721  NA 132* 134* 136  K 4.7 4.2 3.7  CL 91* 98 104  CO2 23 21 26   GLUCOSE 116* 61* 103*  BUN 45* 32* 17  CREATININE 1.16* 0.98 0.71  CALCIUM 10.0 8.8 8.2*   Liver Function Tests: No results found for this basename: AST, ALT, ALKPHOS, BILITOT, PROT, ALBUMIN,  in the last 168 hours No results found for this basename: LIPASE, AMYLASE,  in the last 168 hours No results found for this basename: AMMONIA,  in the last 168 hours CBC:  Recent Labs Lab 12/14/12 1420 12/15/12 0400 12/16/12 0721  WBC 13.6* 11.2* 7.9  NEUTROABS 11.4*  --   --   HGB 12.3 10.6* 10.2*  HCT 35.7* 31.1* 30.5*  MCV 68.8* 69.7* 70.0*  PLT 336 258 243   Cardiac Enzymes: No results found for this basename: CKTOTAL, CKMB, CKMBINDEX, TROPONINI,  in the last 168 hours BNP (last 3 results) No results found for this basename: PROBNP,  in the last 8760 hours CBG:  Recent Labs Lab 12/15/12 0754 12/15/12 0824 12/15/12 0852 12/15/12 1000 12/15/12 1657  GLUCAP 48* 53*  52* 154* 246*    No results found for this or any previous visit (from the past 240 hour(s)).   Studies: No results found.  Scheduled Meds: . capsaicin   Topical BID  . cefTRIAXone (ROCEPHIN)  IV  1 g Intravenous Q24H  . clotrimazole   Topical BID  . diclofenac sodium  4 g Topical BID  . docusate sodium  100 mg Oral BID  . enoxaparin (LOVENOX) injection  30 mg Subcutaneous Q24H  . feeding supplement  (ENSURE COMPLETE)  237 mL Oral BID BM  . mupirocin ointment   Topical BID  . senna  1 tablet Oral BID   Continuous Infusions:   Principal Problem:   Metabolic encephalopathy Active Problems:   Renal insufficiency   Fall   UTI (lower urinary tract infection)   Dehydration   Alzheimer disease   Hypertension   Arthritis   Hemorrhoids   Leukocytosis, unspecified   Hyponatremia   AKI (acute kidney injury)   Fecal impaction   Protein-calorie malnutrition, severe    Time spent: 35    Jeralyn Bennett  [facility] Pager [phone]. If 7PM-7AM, please contact night-coverage at [email or web address], password [facility] 12/16/2012, 2:43 PM  LOS: 2 days

## 2012-12-17 ENCOUNTER — Encounter (HOSPITAL_COMMUNITY): Payer: Self-pay | Admitting: Internal Medicine

## 2012-12-17 DIAGNOSIS — R636 Underweight: Secondary | ICD-10-CM | POA: Diagnosis present

## 2012-12-17 HISTORY — DX: Underweight: R63.6

## 2012-12-17 LAB — CBC
HCT: 30.7 % — ABNORMAL LOW (ref 36.0–46.0)
Hemoglobin: 10.2 g/dL — ABNORMAL LOW (ref 12.0–15.0)
RBC: 4.36 MIL/uL (ref 3.87–5.11)
RDW: 16.5 % — ABNORMAL HIGH (ref 11.5–15.5)
WBC: 7.4 10*3/uL (ref 4.0–10.5)

## 2012-12-17 LAB — BASIC METABOLIC PANEL
Chloride: 102 mEq/L (ref 96–112)
Creatinine, Ser: 0.69 mg/dL (ref 0.50–1.10)
GFR calc Af Amer: 84 mL/min — ABNORMAL LOW (ref >90)
Potassium: 3.9 mEq/L (ref 3.5–5.1)

## 2012-12-17 MED ORDER — ALBUTEROL SULFATE (5 MG/ML) 0.5% IN NEBU
2.5000 mg | INHALATION_SOLUTION | RESPIRATORY_TRACT | Status: DC | PRN
  Administered 2012-12-17: 2.5 mg via RESPIRATORY_TRACT
  Filled 2012-12-17: qty 0.5

## 2012-12-17 NOTE — Progress Notes (Signed)
[facility] PROGRESS NOTE  [name] Ramos ZOX:[id number] DOB: April 21, 1916 DOA: 12/14/2012 PCP: Darnelle Bos, MD  Brief narrative: [name] Ramos is an 79 y.o. female with PMH of hypertension, diabetes, Alzheimer's dementia, recurrent falls, and hearing impairment who was admitted on 12/14/2012 with confusion and fall at home (lives alone).  Assessment/Plan: Principal Problem:   Metabolic encephalopathy due to UTI superimposed on dementia -CT of the head unremarkable for acute etiology.  -Continue empiric ceftriaxone for UTI. Cultures apparently not sent. -Avoid narcotics and other sedating medications. -Nursing staff to provide frequent reorientation. -Status post PT/OT evaluations. Skilled nursing home placement versus 24 hour supervision recommended. Active Problems:   Severe protein calorie malnutrition / underweight -Seen by dietitian on 12/15/2012. Continue Ensure supplements.   Fecal impaction -Disimpacted by nursing staff. Continue Senokot.   Stage IV chronic kidney disease/AKI secondary to dehydration -Creatinine improved with IV fluids. Creatinine clearance still fairly low at 26.8, likely has underlying stage IV chronic kidney disease.   Fall -Multiple x-rays done on admission, negative for fracture. -Pursuing skilled nursing home placement for further rehabilitation.   UTI (lower urinary tract infection) -Continue empiric Rocephin. Cultures not sent on admission.   HTN (hypertension) -Antihypertensives currently on hold.   Alzheimer disease -Not on routine medications.   Arthritis -Voltaren Gel as needed. Avoid narcotics.   Hemorrhoids -Hemorrhoid cream ordered.   Leukocytosis, unspecified -Likely secondary to UTI and dehydration. Normalized.   Hyponatremia -Likely from dehydration. Improving with IV fluids.  Code Status: DNR Family Communication: No family at the bedside.  Dr. Jory Sims [phone]) is POA.  Disposition Plan: Home  versus SNF depending on progress.   Medical Consultants:  None.  Other Consultants:  Physical therapy  Occupational therapy  Anti-infectives:  Rocephin 12/14/2012--->  HPI/Subjective: [name] Ramos is in good spirits. She still is constipated. Denies pain except for pain in the rectal area. No dyspnea. Awake and alert.  Objective: Filed Vitals:   12/16/12 0600 12/16/12 1400 12/16/12 2110 12/17/12 0439  BP: 136/41 130/40 114/40 129/40  Pulse: 77 78 65 60  Temp: 98.3 F (36.8 C) 98.4 F (36.9 C) 99.2 F (37.3 C) 98.1 F (36.7 C)  TempSrc: Oral Oral Oral Oral  Resp: 20 20 18 18   Height:      Weight:      SpO2: 98% 98% 100% 100%    Intake/Output Summary (Last 24 hours) at 12/17/12 1118 Last data filed at 12/17/12 0600  Gross per 24 hour  Intake    565 ml  Output      0 ml  Net    565 ml    Exam: Gen:  NAD Cardiovascular:  RRR, No M/R/G Respiratory:  Lungs CTAB Gastrointestinal:  Abdomen soft, slightly tender to palpation with positive bowel sounds Extremities:  No C/E/C  Data Reviewed: Basic Metabolic Panel:  Recent Labs Lab 12/14/12 1420 12/15/12 0400 12/16/12 0721 12/17/12 0417  NA 132* 134* 136 136  K 4.7 4.2 3.7 3.9  CL 91* 98 104 102  CO2 23 21 26 27   GLUCOSE 116* 61* 103* 136*  BUN 45* 32* 17 13  CREATININE 1.16* 0.98 0.71 0.69  CALCIUM 10.0 8.8 8.2* 8.4   GFR Estimated Creatinine Clearance: 26.8 ml/min (by C-G formula based on Cr of 0.69).  CBC:  Recent Labs Lab 12/14/12 1420 12/15/12 0400 12/16/12 0721 12/17/12 0417  WBC 13.6* 11.2* 7.9 7.4  NEUTROABS 11.4*  --   --   --   HGB 12.3 10.6* 10.2* 10.2*  HCT 35.7* 31.1* 30.5* 30.7*  MCV 68.8* 69.7* 70.0* 70.4*  PLT 336 258 243 253   Microbiology No results found for this or any previous visit (from the past 240 hour(s)).   Procedures and Diagnostic Studies: Dg Chest 2 View  12/14/2012   CLINICAL DATA:  Fall, shortness of breath  EXAM: CHEST  2 VIEW  COMPARISON:   12/09/2010  FINDINGS: Lungs are clear.  No pleural effusion or pneumothorax.  The heart is normal in size.  Degenerative changes of the thoracic spine and bilateral shoulders, right greater than left.  IMPRESSION: No evidence of acute cardiopulmonary disease.   Electronically Signed   By: Charline Bills M.D.   On: 12/14/2012 14:27   Dg Pelvis 1-2 Views  12/14/2012   CLINICAL DATA:  Fall, low back/ pelvic pain  EXAM: PELVIS - 1-2 VIEW  COMPARISON:  11/03/2012  FINDINGS: No fracture or dislocation is seen.  Mild degenerative changes of the bilateral hips.  Visualized bony pelvis appears intact.  IMPRESSION: No fracture or dislocation is seen.   Electronically Signed   By: Charline Bills M.D.   On: 12/14/2012 14:35   Dg Elbow Complete Left  12/14/2012   CLINICAL DATA:  Left elbow pain following injury  EXAM: LEFT ELBOW - COMPLETE 3+ VIEW  COMPARISON:  None.  FINDINGS: There is no evidence of fracture, dislocation, or joint effusion. There is no evidence of arthropathy or other focal bone abnormality. Soft tissues are unremarkable  IMPRESSION: No acute abnormality noted.   Electronically Signed   By: Alcide Clever M.D.   On: 12/14/2012 14:19   Ct Head Wo Contrast  12/14/2012   CLINICAL DATA:  Fell wall talking on the phone, found on floor in bathroom, history hypertension, diabetes, Alzheimer dementia  EXAM: CT HEAD WITHOUT CONTRAST  CT CERVICAL SPINE WITHOUT CONTRAST  TECHNIQUE: Multidetector CT imaging of the head and cervical spine was performed following the standard protocol without intravenous contrast. Multiplanar CT image reconstructions of the cervical spine were also generated.  COMPARISON:  07/02/2010  FINDINGS: CT HEAD FINDINGS  Generalized atrophy.  Normal ventricular morphology.  No midline shift or mass effect.  Small vessel chronic ischemic changes of deep cerebral white matter.  No intracranial hemorrhage, mass lesion, or acute infarction.  Visualized paranasal sinuses and mastoid air  cells clear.  Bones unremarkable.  CT CERVICAL SPINE FINDINGS  Inferior T1 excluded anteriorly.  Prevertebral soft tissues normal thickness.  Multilevel facet degenerative changes of the cervical spine.  Mild anterolisthesis these at C3-C4 and C4-C5, perhaps slightly greater at C3-C4 though this could be related to slight flexion.  Vertebral body heights maintained without fracture or additional subluxation.  Calcification of the anterior longitudinal ligament at C6-T1.  Lung apices clear.  Few scattered atherosclerotic calcifications of the left carotid system.  IMPRESSION: CT HEAD:  No acute intracranial abnormalities.  Atrophy with small vessel chronic ischemic changes of deep cerebral white matter.  CT CERVICAL SPINE:  Multilevel degenerative disc and facet disease changes of the cervical spine.  No definite acute cervical spine abnormalities.   Electronically Signed   By: Ulyses Southward M.D.   On: 12/14/2012 14:41   Ct Cervical Spine Wo Contrast  12/14/2012   CLINICAL DATA:  Fell wall talking on the phone, found on floor in bathroom, history hypertension, diabetes, Alzheimer dementia  EXAM: CT HEAD WITHOUT CONTRAST  CT CERVICAL SPINE WITHOUT CONTRAST  TECHNIQUE: Multidetector CT imaging of the head and cervical spine was performed following the  standard protocol without intravenous contrast. Multiplanar CT image reconstructions of the cervical spine were also generated.  COMPARISON:  07/02/2010  FINDINGS: CT HEAD FINDINGS  Generalized atrophy.  Normal ventricular morphology.  No midline shift or mass effect.  Small vessel chronic ischemic changes of deep cerebral white matter.  No intracranial hemorrhage, mass lesion, or acute infarction.  Visualized paranasal sinuses and mastoid air cells clear.  Bones unremarkable.  CT CERVICAL SPINE FINDINGS  Inferior T1 excluded anteriorly.  Prevertebral soft tissues normal thickness.  Multilevel facet degenerative changes of the cervical spine.  Mild anterolisthesis these  at C3-C4 and C4-C5, perhaps slightly greater at C3-C4 though this could be related to slight flexion.  Vertebral body heights maintained without fracture or additional subluxation.  Calcification of the anterior longitudinal ligament at C6-T1.  Lung apices clear.  Few scattered atherosclerotic calcifications of the left carotid system.  IMPRESSION: CT HEAD:  No acute intracranial abnormalities.  Atrophy with small vessel chronic ischemic changes of deep cerebral white matter.  CT CERVICAL SPINE:  Multilevel degenerative disc and facet disease changes of the cervical spine.  No definite acute cervical spine abnormalities.   Electronically Signed   By: Ulyses Southward M.D.   On: 12/14/2012 14:41   Dg Humerus Left  12/14/2012   CLINICAL DATA:  Left arm pain following injury  EXAM: LEFT HUMERUS - 2+ VIEW  COMPARISON:  07/02/2010  FINDINGS: No acute fracture or dislocation is noted. There are calcific changes noted along the humeral head of a degenerative nature which are stable. Degenerative changes of the acromioclavicular joint are seen.  IMPRESSION: Degenerative changes without acute abnormality.   Electronically Signed   By: Alcide Clever M.D.   On: 12/14/2012 14:19    Scheduled Meds: . capsaicin   Topical BID  . cefTRIAXone (ROCEPHIN)  IV  1 g Intravenous Q24H  . clotrimazole   Topical BID  . diclofenac sodium  4 g Topical BID  . docusate sodium  100 mg Oral BID  . enoxaparin (LOVENOX) injection  30 mg Subcutaneous Q24H  . feeding supplement (ENSURE COMPLETE)  237 mL Oral BID BM  . mupirocin ointment   Topical BID  . senna  1 tablet Oral BID   Continuous Infusions:    Time spent: 25 minutes.    LOS: 3 days   RAMA,CHRISTINA  [facility] Pager [phone].   *Please note that the hospitalists switch teams on Wednesdays. Please call the flow manager at [phone] if you are having difficulty reaching the hospitalist taking care of this patient as she can update you and provide the most  up-to-date pager number of provider caring for the patient. If 8PM-8AM, please contact night-coverage at [email or web address], password [facility]  12/17/2012, 11:18 AM    Information printed out, explained, and given to the patient:  In an effort to keep you and your family informed about your hospital stay, I am providing you with this information sheet. If you or your family have any questions, please do not hesitate to have the nursing staff page me to set up a meeting time.  [name] Ramos 12/17/2012 3 (Number of days in the hospital)  Treatment team:  Dr. Hillery Aldo, Hospitalist (Internist)  Roxy Manns (case manager)  Active Treatment Issues with Plan: Principal Problem:   Urinary tract infection -Treating with antibiotics (Ceftriaxone). -Urine cultures were sent (not back yet). Active Problems:   Dehydration -Improved with IV fluids.   Fall -Multiple x-rays done on admission, negative for fracture. -The physical  therapist will evaluate your strength and balance while you were here.   History of high blood pressure -Blood pressure medicines currently on hold. Latest blood pressure 138/47.   Arthritis -Voltaren Gel as needed. Avoid narcotics.   Hemorrhoids -Hemorrhoid cream ordered.  Significant Lab results: WBC 13.6 ---> 11.2 (infection fighting cells)  Significant diagnostic study results: CT of the head and neck: No fractures.  No acute findings. Xrays of the chest, pelvis, left arm and elbow: No fractures.  Anticipated discharge date: Depends on progress.

## 2012-12-17 NOTE — Progress Notes (Signed)
[name] Ramos has been active with [facility] Care Management services prior to admission. Patient lives alone and her daughter lives in [place]. Safest discharge plan for patient is for SNF. It appears that daughter is looking for facilities. [facility] Community Care Coordinator expresses concern that home is not a safe discharge for patient as she does not have 24/7 long-term. Spoke with inpatient RNCM to make aware as well.  Raiford Noble, MSN-Ed, RN,BSN- [facility] [phone]

## 2012-12-18 ENCOUNTER — Encounter (HOSPITAL_COMMUNITY): Payer: Self-pay | Admitting: Internal Medicine

## 2012-12-18 MED ORDER — IRBESARTAN 150 MG PO TABS
150.0000 mg | ORAL_TABLET | Freq: Every day | ORAL | Status: DC
  Administered 2012-12-18: 150 mg via ORAL
  Filled 2012-12-18: qty 1

## 2012-12-18 MED ORDER — MUPIROCIN 2 % EX OINT: TOPICAL_OINTMENT | Freq: Two times a day (BID) | CUTANEOUS | Status: DC

## 2012-12-18 MED ORDER — SENNA 8.6 MG PO TABS: 1.0000 | ORAL_TABLET | Freq: Two times a day (BID) | ORAL | Status: DC

## 2012-12-18 MED ORDER — ENSURE COMPLETE PO LIQD: 237.0000 mL | Freq: Two times a day (BID) | ORAL | Status: DC

## 2012-12-18 MED ORDER — AMLODIPINE BESYLATE 10 MG PO TABS
10.0000 mg | ORAL_TABLET | Freq: Every day | ORAL | Status: DC
  Administered 2012-12-18: 10 mg via ORAL
  Filled 2012-12-18: qty 1

## 2012-12-18 MED ORDER — CAPSAICIN 0.025 % EX CREA: TOPICAL_CREAM | Freq: Two times a day (BID) | CUTANEOUS | Status: DC

## 2012-12-18 MED ORDER — AMLODIPINE BESYLATE-VALSARTAN 10-160 MG PO TABS: 1.0000 | ORAL_TABLET | Freq: Every day | ORAL | Status: DC

## 2012-12-18 MED ORDER — POLYETHYLENE GLYCOL 3350 17 G PO PACK: 17.0000 g | PACK | Freq: Every day | ORAL | Status: DC | PRN

## 2012-12-18 MED ORDER — DSS 100 MG PO CAPS: 100.0000 mg | ORAL_CAPSULE | Freq: Two times a day (BID) | ORAL | Status: DC

## 2012-12-18 MED ORDER — HYDROCORTISONE 2.5 % RE CREA: TOPICAL_CREAM | Freq: Two times a day (BID) | RECTAL | Status: DC | PRN

## 2012-12-18 NOTE — Progress Notes (Signed)
Pt. Was discharged to [facility]. Her discharge packet was sent with transport on discharge.

## 2012-12-18 NOTE — Discharge Summary (Signed)
Physician Discharge Summary  [name] Ramos ZOX:[id number] DOB: 12-Nov-1916 DOA: 12/14/2012  PCP: Darnelle Bos, MD  Admit date: 12/14/2012 Discharge date: 12/18/2012  Recommendations for Outpatient Follow-up:  1. The patient is being discharged to [facility]. 2. Monitor closely for fecal impaction/chronic constipation. 3. Avoid NSAIDs/nephrotoxins and renally dose medications given stage IV chronic kidney disease. 4. Recommend close followup of serum creatinine after resuming antihypertensives. 5. Recommend close followup of glycemic control. Not on any diabetes medications at baseline, and was hypoglycemic early in her hospital course.  Discharge Diagnoses:  Principal Problem:    Metabolic encephalopathy due to UTI superimposed on dementia Active Problems:    Chronic kidney disease, stage IV (severe)    Fall    UTI (lower urinary tract infection)    Dehydration    Alzheimer disease    Hypertension    Arthritis    Hemorrhoids    Leukocytosis, unspecified    Hyponatremia    AKI (acute kidney injury)    Fecal impaction    Protein-calorie malnutrition, severe    Underweight    Stage IV chronic kidney disease    Hypoglycemia  Discharge Condition: Improved.  Diet recommendation:  Low-sodium, heart healthy, carbohydrate modified. Dysphasia 3.  History of present illness:  [name] Ramos is an 79 y.o. female with PMH of hypertension, diabetes, Alzheimer's dementia, recurrent falls, and hearing impairment who was admitted on 12/14/2012 with confusion and fall at home (lives alone).  Hospital Course by problem:  Principal Problem:  Metabolic encephalopathy due to UTI superimposed on dementia  -CT of the head unremarkable for acute etiology.  -Treated with empiric ceftriaxone for UTI, discontinue today. Cultures apparently not sent.  -Mental status appears to be back to baseline.  -Nursing staff provided frequent reorientation.  -Status post  PT/OT evaluations. Skilled nursing home placement versus 24 hour supervision recommended.  Active Problems:  Hypoglycemia -Resolved. History of diabetes. Severe protein calorie malnutrition / underweight  -Seen by dietitian on 12/15/2012. Continue Ensure supplements.  Fecal impaction  -Disimpacted by nursing staff. Continue Senokot and bowel regimen.  Stage IV chronic kidney disease/AKI secondary to dehydration  -Creatinine improved with IV fluids. Creatinine clearance still fairly low at 26.8, likely has underlying stage IV chronic kidney disease.  Fall  -Multiple x-rays done on admission, negative for fracture.  -Pursuing skilled nursing home placement for further rehabilitation.  UTI (lower urinary tract infection)  -Status post 5 days of treatment with Rocephin. Cultures not sent on admission.  HTN (hypertension)  -Antihypertensives currently on hold. Resume Exforge. Watch renal function. Continue to hold metoprolol.  Alzheimer disease  -Not on routine medications.  Arthritis  -Voltaren Gel as needed. Avoid narcotics.  Hemorrhoids  -Hemorrhoid cream ordered.  Leukocytosis, unspecified  -Likely secondary to UTI and dehydration. Normalized.  Hyponatremia  -Likely from dehydration. Improving with IV fluids.  Consultations:  None.  Discharge Exam: Filed Vitals:   12/18/12 0456  BP: 150/47  Pulse: 73  Temp: 98 F (36.7 C)  Resp: 16   Filed Vitals:   12/17/12 0439 12/17/12 1400 12/17/12 2034 12/18/12 0456  BP: 129/40 120/42 150/45 150/47  Pulse: 60 62 74 73  Temp: 98.1 F (36.7 C) 89.6 F (32 C) 98.7 F (37.1 C) 98 F (36.7 C)  TempSrc: Oral Oral Oral Oral  Resp: 18 18 16 16   Height:      Weight:      SpO2: 100% 100% 100% 99%    Gen: NAD  Cardiovascular: RRR, No M/R/G  Respiratory: Lungs CTAB  Gastrointestinal: Abdomen soft, slightly tender to palpation with positive bowel sounds  Extremities: No C/E/C  Discharge Instructions  Discharge Orders   Future  Appointments Provider Department Dept Phone   07/26/2013 2:00 PM Everette Rank, MD [facility] [address] [phone]   Future Orders Complete By Expires   Call MD for:  persistant nausea and vomiting  As directed    Call MD for:  severe uncontrolled pain  As directed    Call MD for:  temperature >100.4  As directed    Diet - low sodium heart healthy  As directed    Discharge instructions  As directed    Comments:     You were cared for by Dr. Hillery Aldo  (a hospitalist) during your hospital stay. If you have any questions about your discharge medications or the care you received while you were in the hospital after you are discharged, you can call the unit and ask to speak with the hospitalist on call if the hospitalist that took care of you is not available. Once you are discharged, your primary care physician will handle any further medical issues. Please note that NO REFILLS for any discharge medications will be authorized once you are discharged, as it is imperative that you return to your primary care physician (or establish a relationship with a primary care physician if you do not have one) for your aftercare needs so that they can reassess your need for medications and monitor your lab values.  Any outstanding tests can be reviewed by your PCP at your follow up visit.  It is also important to review any medicine changes with your PCP.  Please bring these d/c instructions with you to your next visit so your physician can review these changes with you.  If you do not have a primary care physician, you can call [phone] for a physician referral.  It is highly recommended that you obtain a PCP for hospital follow up.   Discharge wound care:  As directed    Comments:     Apply Bactroban to left elbow and cover with clean dry dressing daily.   Increase activity slowly  As directed    Walk with assistance  As directed    Walker   As directed        Medication List    STOP taking  these medications       LORazepam 0.5 MG tablet  Commonly known as:  ATIVAN     metoprolol succinate 25 MG 24 hr tablet  Commonly known as:  TOPROL-XL      TAKE these medications       amLODipine-valsartan 10-160 MG per tablet  Commonly known as:  EXFORGE  Take 1 tablet by mouth daily.     aspirin EC 81 MG tablet  Take 162 mg by mouth daily.     capsaicin 0.025 % cream  Commonly known as:  ZOSTRIX  Apply topically 2 (two) times daily.     clotrimazole-betamethasone cream  Commonly known as:  LOTRISONE  Apply 1 application topically 2 (two) times daily.     CORTAID MAXIMUM STRENGTH 1 %  Generic drug:  hydrocortisone cream  Apply 1 application topically 2 (two) times daily. Applies to affected area for rash.     diclofenac sodium 1 % Gel  Commonly known as:  VOLTAREN  Apply 1 application topically 2 (two) times daily as needed (For pain.).     DSS 100 MG Caps  Take 100 mg by mouth 2 (two) times daily.     feeding supplement (ENSURE COMPLETE) Liqd  Take 237 mLs by mouth 2 (two) times daily between meals.     hydrocortisone 2.5 % rectal cream  Commonly known as:  ANUSOL-HC  Place rectally 2 (two) times daily as needed.     mupirocin ointment 2 %  Commonly known as:  BACTROBAN  Apply topically 2 (two) times daily.     OVER THE COUNTER MEDICATION  Take 1 each by mouth daily as needed (For mouth or throat.). Vitamin C 60mg  Drops     OVER THE COUNTER MEDICATION  Take 1 each by mouth daily as needed (For mouth or throat.). Honey Lemon Cough Drops 8%     polyethylene glycol packet  Commonly known as:  MIRALAX / GLYCOLAX  Take 17 g by mouth daily as needed.     senna 8.6 MG Tabs tablet  Commonly known as:  SENOKOT  Take 1 tablet (8.6 mg total) by mouth 2 (two) times daily.     traMADol 50 MG tablet  Commonly known as:  ULTRAM  Take 50 mg by mouth 3 (three) times daily as needed (For buttock pain.).           Follow-up Information   Follow up with  Darnelle Bos, MD. Schedule an appointment as soon as possible for a visit in 1 week.   Specialty:  Internal Medicine   Contact information:   [address], [address] [place] [address] [phone]        The results of significant diagnostics from this hospitalization (including imaging, microbiology, ancillary and laboratory) are listed below for reference.    Significant Diagnostic Studies: Dg Chest 2 View  12/14/2012   CLINICAL DATA:  Fall, shortness of breath  EXAM: CHEST  2 VIEW  COMPARISON:  12/09/2010  FINDINGS: Lungs are clear.  No pleural effusion or pneumothorax.  The heart is normal in size.  Degenerative changes of the thoracic spine and bilateral shoulders, right greater than left.  IMPRESSION: No evidence of acute cardiopulmonary disease.   Electronically Signed   By: Charline Bills M.D.   On: 12/14/2012 14:27   Dg Pelvis 1-2 Views  12/14/2012   CLINICAL DATA:  Fall, low back/ pelvic pain  EXAM: PELVIS - 1-2 VIEW  COMPARISON:  11/03/2012  FINDINGS: No fracture or dislocation is seen.  Mild degenerative changes of the bilateral hips.  Visualized bony pelvis appears intact.  IMPRESSION: No fracture or dislocation is seen.   Electronically Signed   By: Charline Bills M.D.   On: 12/14/2012 14:35   Dg Elbow Complete Left  12/14/2012   CLINICAL DATA:  Left elbow pain following injury  EXAM: LEFT ELBOW - COMPLETE 3+ VIEW  COMPARISON:  None.  FINDINGS: There is no evidence of fracture, dislocation, or joint effusion. There is no evidence of arthropathy or other focal bone abnormality. Soft tissues are unremarkable  IMPRESSION: No acute abnormality noted.   Electronically Signed   By: Alcide Clever M.D.   On: 12/14/2012 14:19   Ct Head Wo Contrast  12/14/2012   CLINICAL DATA:  Fell wall talking on the phone, found on floor in bathroom, history hypertension, diabetes, Alzheimer dementia  EXAM: CT HEAD WITHOUT CONTRAST  CT CERVICAL SPINE WITHOUT CONTRAST   TECHNIQUE: Multidetector CT imaging of the head and cervical spine was performed following the standard protocol without intravenous contrast. Multiplanar CT image reconstructions of the cervical spine were  also generated.  COMPARISON:  07/02/2010  FINDINGS: CT HEAD FINDINGS  Generalized atrophy.  Normal ventricular morphology.  No midline shift or mass effect.  Small vessel chronic ischemic changes of deep cerebral white matter.  No intracranial hemorrhage, mass lesion, or acute infarction.  Visualized paranasal sinuses and mastoid air cells clear.  Bones unremarkable.  CT CERVICAL SPINE FINDINGS  Inferior T1 excluded anteriorly.  Prevertebral soft tissues normal thickness.  Multilevel facet degenerative changes of the cervical spine.  Mild anterolisthesis these at C3-C4 and C4-C5, perhaps slightly greater at C3-C4 though this could be related to slight flexion.  Vertebral body heights maintained without fracture or additional subluxation.  Calcification of the anterior longitudinal ligament at C6-T1.  Lung apices clear.  Few scattered atherosclerotic calcifications of the left carotid system.  IMPRESSION: CT HEAD:  No acute intracranial abnormalities.  Atrophy with small vessel chronic ischemic changes of deep cerebral white matter.  CT CERVICAL SPINE:  Multilevel degenerative disc and facet disease changes of the cervical spine.  No definite acute cervical spine abnormalities.   Electronically Signed   By: Ulyses Southward M.D.   On: 12/14/2012 14:41   Ct Cervical Spine Wo Contrast  12/14/2012   CLINICAL DATA:  Fell wall talking on the phone, found on floor in bathroom, history hypertension, diabetes, Alzheimer dementia  EXAM: CT HEAD WITHOUT CONTRAST  CT CERVICAL SPINE WITHOUT CONTRAST  TECHNIQUE: Multidetector CT imaging of the head and cervical spine was performed following the standard protocol without intravenous contrast. Multiplanar CT image reconstructions of the cervical spine were also generated.   COMPARISON:  07/02/2010  FINDINGS: CT HEAD FINDINGS  Generalized atrophy.  Normal ventricular morphology.  No midline shift or mass effect.  Small vessel chronic ischemic changes of deep cerebral white matter.  No intracranial hemorrhage, mass lesion, or acute infarction.  Visualized paranasal sinuses and mastoid air cells clear.  Bones unremarkable.  CT CERVICAL SPINE FINDINGS  Inferior T1 excluded anteriorly.  Prevertebral soft tissues normal thickness.  Multilevel facet degenerative changes of the cervical spine.  Mild anterolisthesis these at C3-C4 and C4-C5, perhaps slightly greater at C3-C4 though this could be related to slight flexion.  Vertebral body heights maintained without fracture or additional subluxation.  Calcification of the anterior longitudinal ligament at C6-T1.  Lung apices clear.  Few scattered atherosclerotic calcifications of the left carotid system.  IMPRESSION: CT HEAD:  No acute intracranial abnormalities.  Atrophy with small vessel chronic ischemic changes of deep cerebral white matter.  CT CERVICAL SPINE:  Multilevel degenerative disc and facet disease changes of the cervical spine.  No definite acute cervical spine abnormalities.   Electronically Signed   By: Ulyses Southward M.D.   On: 12/14/2012 14:41   Dg Humerus Left  12/14/2012   CLINICAL DATA:  Left arm pain following injury  EXAM: LEFT HUMERUS - 2+ VIEW  COMPARISON:  07/02/2010  FINDINGS: No acute fracture or dislocation is noted. There are calcific changes noted along the humeral head of a degenerative nature which are stable. Degenerative changes of the acromioclavicular joint are seen.  IMPRESSION: Degenerative changes without acute abnormality.   Electronically Signed   By: Alcide Clever M.D.   On: 12/14/2012 14:19    Labs:  Basic Metabolic Panel:  Recent Labs Lab 12/14/12 1420 12/15/12 0400 12/16/12 0721 12/17/12 0417  NA 132* 134* 136 136  K 4.7 4.2 3.7 3.9  CL 91* 98 104 102  CO2 23 21 26 27   GLUCOSE 116*  61* 103*  136*  BUN 45* 32* 17 13  CREATININE 1.16* 0.98 0.71 0.69  CALCIUM 10.0 8.8 8.2* 8.4   GFR Estimated Creatinine Clearance: 26.8 ml/min (by C-G formula based on Cr of 0.69).  CBC:  Recent Labs Lab 12/14/12 1420 12/15/12 0400 12/16/12 0721 12/17/12 0417  WBC 13.6* 11.2* 7.9 7.4  NEUTROABS 11.4*  --   --   --   HGB 12.3 10.6* 10.2* 10.2*  HCT 35.7* 31.1* 30.5* 30.7*  MCV 68.8* 69.7* 70.0* 70.4*  PLT 336 258 243 253   CBG:  Recent Labs Lab 12/15/12 0754 12/15/12 0824 12/15/12 0852 12/15/12 1000 12/15/12 1657  GLUCAP 48* 53* 52* 154* 246*    Time coordinating discharge: 40 minutes.  Signed:  RAMA,CHRISTINA  Pager [phone] [facility] 12/18/2012, 1:14 PM

## 2012-12-18 NOTE — Progress Notes (Signed)
[facility] PROGRESS NOTE  [name] Ramos JXB:[id number] DOB: 09/20/1916 DOA: 12/14/2012 PCP: Darnelle Bos, MD  Brief narrative: [name] Ramos is an 79 y.o. female with PMH of hypertension, diabetes, Alzheimer's dementia, recurrent falls, and hearing impairment who was admitted on 12/14/2012 with confusion and fall at home (lives alone).  Assessment/Plan: Principal Problem:   Metabolic encephalopathy due to UTI superimposed on dementia -CT of the head unremarkable for acute etiology.  -Treated with empiric ceftriaxone for UTI, discontinue today. Cultures apparently not sent. -Mental status appears to be back to baseline. -Nursing staff to provide frequent reorientation. -Status post PT/OT evaluations. Skilled nursing home placement versus 24 hour supervision recommended. Active Problems:   Severe protein calorie malnutrition / underweight -Seen by dietitian on 12/15/2012. Continue Ensure supplements.   Fecal impaction -Disimpacted by nursing staff. Continue Senokot.   Stage IV chronic kidney disease/AKI secondary to dehydration -Creatinine improved with IV fluids. Creatinine clearance still fairly low at 26.8, likely has underlying stage IV chronic kidney disease.   Fall -Multiple x-rays done on admission, negative for fracture. -Pursuing skilled nursing home placement for further rehabilitation.   UTI (lower urinary tract infection) -Status post 5 days of treatment with Rocephin. Cultures not sent on admission.   HTN (hypertension) -Antihypertensives currently on hold. Resume Exforge. Watch renal function. Continue to hold metoprolol.   Alzheimer disease -Not on routine medications.   Arthritis -Voltaren Gel as needed. Avoid narcotics.   Hemorrhoids -Hemorrhoid cream ordered.   Leukocytosis, unspecified -Likely secondary to UTI and dehydration. Normalized.   Hyponatremia -Likely from dehydration. Improving with IV fluids.  Code Status: DNR Family  Communication: No family at the bedside.  Dr. Jory Sims [phone]) is POA.  Disposition Plan: Home versus SNF depending on progress.   Medical Consultants:  None.  Other Consultants:  Physical therapy  Occupational therapy  Anti-infectives:  Rocephin 12/14/2012--->  HPI/Subjective: [name] Ramos is little bit agitated this morning, saying "where is my dolll? ". Nursing staff tells me her bowels moved yesterday. Having sacral and right hip pain, precluding working with physical therapy. No dyspnea. Remains awake and alert.  Objective: Filed Vitals:   12/17/12 0439 12/17/12 1400 12/17/12 2034 12/18/12 0456  BP: 129/40 120/42 150/45 150/47  Pulse: 60 62 74 73  Temp: 98.1 F (36.7 C) 89.6 F (32 C) 98.7 F (37.1 C) 98 F (36.7 C)  TempSrc: Oral Oral Oral Oral  Resp: 18 18 16 16   Height:      Weight:      SpO2: 100% 100% 100% 99%    Intake/Output Summary (Last 24 hours) at 12/18/12 1030 Last data filed at 12/18/12 0507  Gross per 24 hour  Intake    360 ml  Output      0 ml  Net    360 ml    Exam: Gen:  NAD Cardiovascular:  RRR, No M/R/G Respiratory:  Lungs CTAB Gastrointestinal:  Abdomen soft, slightly tender to palpation with positive bowel sounds Extremities:  No C/E/C  Data Reviewed: Basic Metabolic Panel:  Recent Labs Lab 12/14/12 1420 12/15/12 0400 12/16/12 0721 12/17/12 0417  NA 132* 134* 136 136  K 4.7 4.2 3.7 3.9  CL 91* 98 104 102  CO2 23 21 26 27   GLUCOSE 116* 61* 103* 136*  BUN 45* 32* 17 13  CREATININE 1.16* 0.98 0.71 0.69  CALCIUM 10.0 8.8 8.2* 8.4   GFR Estimated Creatinine Clearance: 26.8 ml/min (by C-G formula based on Cr of 0.69).  CBC:  Recent  Labs Lab 12/14/12 1420 12/15/12 0400 12/16/12 0721 12/17/12 0417  WBC 13.6* 11.2* 7.9 7.4  NEUTROABS 11.4*  --   --   --   HGB 12.3 10.6* 10.2* 10.2*  HCT 35.7* 31.1* 30.5* 30.7*  MCV 68.8* 69.7* 70.0* 70.4*  PLT 336 258 243 253   Microbiology No results found  for this or any previous visit (from the past 240 hour(s)).   Procedures and Diagnostic Studies: Dg Chest 2 View  12/14/2012   CLINICAL DATA:  Fall, shortness of breath  EXAM: CHEST  2 VIEW  COMPARISON:  12/09/2010  FINDINGS: Lungs are clear.  No pleural effusion or pneumothorax.  The heart is normal in size.  Degenerative changes of the thoracic spine and bilateral shoulders, right greater than left.  IMPRESSION: No evidence of acute cardiopulmonary disease.   Electronically Signed   By: Charline Bills M.D.   On: 12/14/2012 14:27   Dg Pelvis 1-2 Views  12/14/2012   CLINICAL DATA:  Fall, low back/ pelvic pain  EXAM: PELVIS - 1-2 VIEW  COMPARISON:  11/03/2012  FINDINGS: No fracture or dislocation is seen.  Mild degenerative changes of the bilateral hips.  Visualized bony pelvis appears intact.  IMPRESSION: No fracture or dislocation is seen.   Electronically Signed   By: Charline Bills M.D.   On: 12/14/2012 14:35   Dg Elbow Complete Left  12/14/2012   CLINICAL DATA:  Left elbow pain following injury  EXAM: LEFT ELBOW - COMPLETE 3+ VIEW  COMPARISON:  None.  FINDINGS: There is no evidence of fracture, dislocation, or joint effusion. There is no evidence of arthropathy or other focal bone abnormality. Soft tissues are unremarkable  IMPRESSION: No acute abnormality noted.   Electronically Signed   By: Alcide Clever M.D.   On: 12/14/2012 14:19   Ct Head Wo Contrast  12/14/2012   CLINICAL DATA:  Fell wall talking on the phone, found on floor in bathroom, history hypertension, diabetes, Alzheimer dementia  EXAM: CT HEAD WITHOUT CONTRAST  CT CERVICAL SPINE WITHOUT CONTRAST  TECHNIQUE: Multidetector CT imaging of the head and cervical spine was performed following the standard protocol without intravenous contrast. Multiplanar CT image reconstructions of the cervical spine were also generated.  COMPARISON:  07/02/2010  FINDINGS: CT HEAD FINDINGS  Generalized atrophy.  Normal ventricular morphology.  No  midline shift or mass effect.  Small vessel chronic ischemic changes of deep cerebral white matter.  No intracranial hemorrhage, mass lesion, or acute infarction.  Visualized paranasal sinuses and mastoid air cells clear.  Bones unremarkable.  CT CERVICAL SPINE FINDINGS  Inferior T1 excluded anteriorly.  Prevertebral soft tissues normal thickness.  Multilevel facet degenerative changes of the cervical spine.  Mild anterolisthesis these at C3-C4 and C4-C5, perhaps slightly greater at C3-C4 though this could be related to slight flexion.  Vertebral body heights maintained without fracture or additional subluxation.  Calcification of the anterior longitudinal ligament at C6-T1.  Lung apices clear.  Few scattered atherosclerotic calcifications of the left carotid system.  IMPRESSION: CT HEAD:  No acute intracranial abnormalities.  Atrophy with small vessel chronic ischemic changes of deep cerebral white matter.  CT CERVICAL SPINE:  Multilevel degenerative disc and facet disease changes of the cervical spine.  No definite acute cervical spine abnormalities.   Electronically Signed   By: Ulyses Southward M.D.   On: 12/14/2012 14:41   Ct Cervical Spine Wo Contrast  12/14/2012   CLINICAL DATA:  Larey Seat wall talking on the phone, found on  floor in bathroom, history hypertension, diabetes, Alzheimer dementia  EXAM: CT HEAD WITHOUT CONTRAST  CT CERVICAL SPINE WITHOUT CONTRAST  TECHNIQUE: Multidetector CT imaging of the head and cervical spine was performed following the standard protocol without intravenous contrast. Multiplanar CT image reconstructions of the cervical spine were also generated.  COMPARISON:  07/02/2010  FINDINGS: CT HEAD FINDINGS  Generalized atrophy.  Normal ventricular morphology.  No midline shift or mass effect.  Small vessel chronic ischemic changes of deep cerebral white matter.  No intracranial hemorrhage, mass lesion, or acute infarction.  Visualized paranasal sinuses and mastoid air cells clear.  Bones  unremarkable.  CT CERVICAL SPINE FINDINGS  Inferior T1 excluded anteriorly.  Prevertebral soft tissues normal thickness.  Multilevel facet degenerative changes of the cervical spine.  Mild anterolisthesis these at C3-C4 and C4-C5, perhaps slightly greater at C3-C4 though this could be related to slight flexion.  Vertebral body heights maintained without fracture or additional subluxation.  Calcification of the anterior longitudinal ligament at C6-T1.  Lung apices clear.  Few scattered atherosclerotic calcifications of the left carotid system.  IMPRESSION: CT HEAD:  No acute intracranial abnormalities.  Atrophy with small vessel chronic ischemic changes of deep cerebral white matter.  CT CERVICAL SPINE:  Multilevel degenerative disc and facet disease changes of the cervical spine.  No definite acute cervical spine abnormalities.   Electronically Signed   By: Ulyses Southward M.D.   On: 12/14/2012 14:41   Dg Humerus Left  12/14/2012   CLINICAL DATA:  Left arm pain following injury  EXAM: LEFT HUMERUS - 2+ VIEW  COMPARISON:  07/02/2010  FINDINGS: No acute fracture or dislocation is noted. There are calcific changes noted along the humeral head of a degenerative nature which are stable. Degenerative changes of the acromioclavicular joint are seen.  IMPRESSION: Degenerative changes without acute abnormality.   Electronically Signed   By: Alcide Clever M.D.   On: 12/14/2012 14:19    Scheduled Meds: . capsaicin   Topical BID  . cefTRIAXone (ROCEPHIN)  IV  1 g Intravenous Q24H  . clotrimazole   Topical BID  . diclofenac sodium  4 g Topical BID  . docusate sodium  100 mg Oral BID  . enoxaparin (LOVENOX) injection  30 mg Subcutaneous Q24H  . feeding supplement (ENSURE COMPLETE)  237 mL Oral BID BM  . mupirocin ointment   Topical BID  . senna  1 tablet Oral BID   Continuous Infusions:    Time spent: 25 minutes.    LOS: 4 days   [name] Ramos,[name] Ramos  [facility] Pager [phone].   *Please note that the  hospitalists switch teams on Wednesdays. Please call the flow manager at [phone] if you are having difficulty reaching the hospitalist taking care of this patient as she can update you and provide the most up-to-date pager number of provider caring for the patient. If 8PM-8AM, please contact night-coverage at [email or web address], password [facility]  12/18/2012, 10:30 AM    Information printed out, explained, and given to the patient:  In an effort to keep you and your family informed about your hospital stay, I am providing you with this information sheet. If you or your family have any questions, please do not hesitate to have the nursing staff page me to set up a meeting time.  [name] Ramos 12/18/2012 4 (Number of days in the hospital)  Treatment team:  Dr. Hillery Aldo, Hospitalist (Internist)  Roxy Manns (case manager)  Active Treatment Issues with Plan: Principal Problem:   Urinary  tract infection -Treating with antibiotics (Ceftriaxone). -Urine cultures were sent (not back yet). Active Problems:   Dehydration -Improved with IV fluids.   Fall -Multiple x-rays done on admission, negative for fracture. -The physical therapist will evaluate your strength and balance while you were here.   History of high blood pressure -Blood pressure medicines currently on hold. Latest blood pressure 138/47.   Arthritis -Voltaren Gel as needed. Avoid narcotics.   Hemorrhoids -Hemorrhoid cream ordered.  Significant Lab results: WBC 13.6 ---> 11.2 (infection fighting cells)  Significant diagnostic study results: CT of the head and neck: No fractures.  No acute findings. Xrays of the chest, pelvis, left arm and elbow: No fractures.  Anticipated discharge date: Depends on progress.

## 2012-12-18 NOTE — Progress Notes (Signed)
Pt d/c to [facility] via [facility].  Dsean Vantol LCSW [phone]

## 2012-12-18 NOTE — Progress Notes (Signed)
Occupational Therapy Treatment Patient Details Name: CAMERAN AHMED MRN: [id number] DOB: Oct 13, 1916 Today's Date: 12/18/2012 Time: 1010-1027 OT Time Calculation (min): 17 min  OT Assessment / Plan / Recommendation  History of present illness The patient is a 79 y.o. year-old female with history of HTN, DM, Alzheimer's dementia, recurrent falls, hearing impairment who presents with fall and confusion.   OT comments  Pt limited by significant pain in R hip which inhibited her ability to participate in ADL today. Only able to attempt transferring to EOB but without success. Nursing and MD made aware.    Follow Up Recommendations  SNF;Supervision/Assistance - 24 hour    Barriers to Discharge       Equipment Recommendations  3 in 1 bedside comode    Recommendations for Other Services    Frequency Min 2X/week   Progress towards OT Goals Progress towards OT goals: Not progressing toward goals - comment (limited by significant R hip pain)  Plan Discharge plan remains appropriate    Precautions / Restrictions Precautions Precautions: Fall Restrictions Weight Bearing Restrictions: No   Pertinent Vitals/Pain Pt states "the worst" pain in R hip. Nursing made aware.     ADL  ADL Comments: Attempted to sit on EOB X2 but pt unable to tolerate due to R hip pain. Nursing and MD made aware. Used bed pad to scoot hips around to EOB as pt unable to assist much on her own. Pt grimaces  and calls out in pain each time LEs are moved. Unable to progress in session today.     OT Diagnosis:    OT Problem List:   OT Treatment Interventions:     OT Goals(current goals can now be found in the care plan section)    Visit Information  Last OT Received On: 12/18/12 Assistance Needed: +2 History of Present Illness: The patient is a 79 y.o. year-old female with history of HTN, DM, Alzheimer's dementia, recurrent falls, hearing impairment who presents with fall and confusion.    Subjective Data       Prior Functioning       Cognition  Cognition Arousal/Alertness: Awake/alert Overall Cognitive Status: No family/caregiver present to determine baseline cognitive functioning    Mobility  Bed Mobility Details for Bed Mobility Assistance: attempted to scoot to EOB but only made it part of the way due to pain. attempted X2. Return to supine.    Exercises      Balance     End of Session OT - End of Session Activity Tolerance: Patient limited by pain Patient left: in bed;with call bell/phone within reach;with bed alarm set  GO     Lennox Laity [phone] 12/18/2012, 11:42 AM

## 2012-12-18 NOTE — Progress Notes (Signed)
Pt. IV due to be changed. Per MD order pt. May leave IV for remainder of hospital admission.

## 2012-12-21 ENCOUNTER — Non-Acute Institutional Stay (SKILLED_NURSING_FACILITY): Payer: Medicare Other | Admitting: Internal Medicine

## 2012-12-21 DIAGNOSIS — K5900 Constipation, unspecified: Secondary | ICD-10-CM

## 2012-12-21 DIAGNOSIS — N184 Chronic kidney disease, stage 4 (severe): Secondary | ICD-10-CM

## 2012-12-21 DIAGNOSIS — I150 Renovascular hypertension: Secondary | ICD-10-CM

## 2012-12-21 DIAGNOSIS — F0280 Dementia in other diseases classified elsewhere without behavioral disturbance: Secondary | ICD-10-CM

## 2012-12-28 ENCOUNTER — Non-Acute Institutional Stay (SKILLED_NURSING_FACILITY): Payer: Medicare Other | Admitting: Internal Medicine

## 2012-12-28 DIAGNOSIS — D473 Essential (hemorrhagic) thrombocythemia: Secondary | ICD-10-CM

## 2012-12-28 DIAGNOSIS — D631 Anemia in chronic kidney disease: Secondary | ICD-10-CM

## 2012-12-28 DIAGNOSIS — F411 Generalized anxiety disorder: Secondary | ICD-10-CM

## 2013-01-18 ENCOUNTER — Non-Acute Institutional Stay (SKILLED_NURSING_FACILITY): Payer: Medicare Other | Admitting: Internal Medicine

## 2013-01-18 ENCOUNTER — Encounter: Payer: Self-pay | Admitting: Internal Medicine

## 2013-01-18 DIAGNOSIS — F0280 Dementia in other diseases classified elsewhere without behavioral disturbance: Secondary | ICD-10-CM

## 2013-01-18 DIAGNOSIS — I150 Renovascular hypertension: Secondary | ICD-10-CM | POA: Insufficient documentation

## 2013-01-18 DIAGNOSIS — D631 Anemia in chronic kidney disease: Secondary | ICD-10-CM | POA: Insufficient documentation

## 2013-01-18 DIAGNOSIS — N184 Chronic kidney disease, stage 4 (severe): Secondary | ICD-10-CM

## 2013-01-18 NOTE — Progress Notes (Signed)
                PROGRESS NOTE  DATE: 01/18/2013  FACILITY: Nursing Home Location: [facility]  LEVEL OF CARE: SNF (31)  Routine Visit  CHIEF COMPLAINT:  Manage Alzheimer's dementia, renovascular hypertension and chronic kidney disease stage IV  HISTORY OF PRESENT ILLNESS:  DEMENTIA: The dementia remaines stable and continues to function adequately in the current living environment with supervision.  The patient has had little changes in behavior. No complications noted from the medications presently being used.  HTN: Pt 's HTN remains stable.  Denies CP, sob, DOE, pedal edema, headaches, dizziness or visual disturbances.  No complications from the medications currently being used.  Last BP : 124/66.  CHRONIC KIDNEY DISEASE: The patient's chronic kidney disease remains stable.  Patient denies increasing lower extremity swelling or confusion. Last BUN and creatinine are: 14/0.7 in 11-14.  REASSESSMENT OF ONGOING PROBLEM(S):  PAST MEDICAL HISTORY : Reviewed.  No changes.  CURRENT MEDICATIONS: Reviewed per [facility]  REVIEW OF SYSTEMS:  GENERAL: no change in appetite, no fatigue, no weight changes, no fever, chills or weakness RESPIRATORY: no cough, SOB, DOE, wheezing, hemoptysis CARDIAC: no chest pain, edema or palpitations GI: no abdominal pain, diarrhea, constipation, heart burn, nausea or vomiting  PHYSICAL EXAMINATION  VS:  T 97.9       P 80     RR 16      BP     POX %     WT (Lb) 95.6  GENERAL: no acute distress, normal body habitus EYES: conjunctivae normal, sclerae normal, normal eye lids NECK: supple, trachea midline, no neck masses, no thyroid tenderness, no thyromegaly LYMPHATICS: no LAN in the neck, no supraclavicular LAN RESPIRATORY: breathing is even & unlabored, BS CTAB CARDIAC: RRR, no murmur,no extra heart sounds, no edema GI: abdomen soft, normal BS, no masses, no tenderness, no hepatomegaly, no splenomegaly PSYCHIATRIC: the patient  is alert & oriented to person, affect & behavior appropriate  LABS/RADIOLOGY:  11-14 hemoglobin 9.5, MCV 71.4, platelets 415, WBC 5.2, BMP normal  ASSESSMENT/PLAN:  Alzheimer's dementia-check TSH and vitamin B12 level Renovascular hypertension-stable Chronic kidney disease stage IV-renal functions normalized Anemia of chronic kidney disease-unstable problem. Hemoglobin declined. Recheck pending iron studies. Constipation-well-controlled. Protein calorie malnutrition-on ensure. Check liver profile  CPT CODE: 16109

## 2013-01-21 DIAGNOSIS — K5900 Constipation, unspecified: Secondary | ICD-10-CM | POA: Insufficient documentation

## 2013-01-21 NOTE — Progress Notes (Signed)
Patient ID: [name] Ramos, female   DOB: 01-Jul-1916, 79 y.o.   MRN: [id number]        HISTORY & PHYSICAL  DATE: 12/21/2012     FACILITY: [facility]  LEVEL OF CARE: SNF (31)  ALLERGIES:  Allergies  Allergen Reactions  . Ace Inhibitors Other (See Comments)    Reaction unknown  . Codeine Other (See Comments)    Reaction unknown   . Penicillins Other (See Comments)    Reaction unknown   . Sulfa Antibiotics Other (See Comments)    Reaction unknown   . Tramadol Other (See Comments)    Reaction unknown     CHIEF COMPLAINT:  Manage Alzheimer's dementia, hypertension, and chronic kidney disease stage IV.    HISTORY OF PRESENT ILLNESS:  The patient is a 79 year-old, African-American female who was hospitalized secondary to metabolic encephalopathy.  After hospitalization, she is admitted to this facility for short-term rehabilitation.    DEMENTIA:  Advanced.  The dementia remains stable and continues to function adequately in the current living environment with supervision.  The patient has had little changes in behavior. No complications noted from the medications presently being used.  Patient overall is a poor historian.    HTN: Pt 's HTN remains stable.  Denies CP, sob, DOE, pedal edema, headaches, dizziness or visual disturbances.  No complications from the medications currently being used.  Last BP :  Not recorded.    CHRONIC KIDNEY DISEASE: The patient's chronic kidney disease remains stable.  Patient denies increasing lower extremity swelling or confusion. Last BUN and creatinine are:   BUN 13, creatinine 0.69.  The patient has stage IV chronic kidney disease.    PAST MEDICAL HISTORY :  Past Medical History  Diagnosis Date  . Alzheimer disease   . Hypertension   . Diabetes mellitus without complication   . Arthritis   . Hearing impaired   . Cataracts, bilateral   . Recurrent falls   . Hemorrhoids   . Chronic kidney disease, stage IV (severe)  12/14/2012  . Fecal impaction 12/15/2012  . Protein-calorie malnutrition, severe 12/16/2012  . Underweight 12/17/2012    PAST SURGICAL HISTORY: Past Surgical History  Procedure Laterality Date  . Tonsillectomy    . Appendectomy    . Cataract extraction, bilateral      SOCIAL HISTORY:  reports that she has never smoked. She has never used smokeless tobacco. She reports that she does not drink alcohol or use illicit drugs.  FAMILY HISTORY:  Family History  Problem Relation Age of Onset  . Colon cancer Neg Hx   . Breast cancer      multiple  . Dementia Sister   . High blood pressure      multiple  . Diabetes      multiple    CURRENT MEDICATIONS: Reviewed per [facility]  REVIEW OF SYSTEMS:  Difficult to obtain.  Patient is a poor historian due to dementia.      PHYSICAL EXAMINATION  VS:  T 97.6      P 66      RR 20      BP       POX%        WT (Lb)  GENERAL: no acute distress, normal body habitus EYES: conjunctivae normal, sclerae normal, normal eye lids MOUTH/THROAT: lips without lesions,no lesions in the mouth,tongue is without lesions,uvula elevates in midline NECK: supple, trachea midline, no neck masses, no thyroid tenderness, no thyromegaly LYMPHATICS: no  LAN in the neck, no supraclavicular LAN RESPIRATORY: breathing is even & unlabored, BS CTAB CARDIAC: RRR, no murmur,no extra heart sounds, no edema GI:  ABDOMEN: abdomen soft, normal BS, no masses, no tenderness  LIVER/SPLEEN: no hepatomegaly, no splenomegaly MUSCULOSKELETAL: HEAD: normal to inspection & palpation BACK: no kyphosis, scoliosis or spinal processes tenderness EXTREMITIES: LEFT UPPER EXTREMITY: full range of motion, normal strength & tone RIGHT UPPER EXTREMITY:  full range of motion, normal strength & tone LEFT LOWER EXTREMITY: strength intact, range of motion moderate   RIGHT LOWER EXTREMITY: strength intact, range of motion moderate   PSYCHIATRIC: the patient is alert & oriented to person, affect &  behavior appropriate  LABS/RADIOLOGY: Chest x-ray:  No acute disease.    Pelvic x-ray:  No fracture or dislocation.    Left elbow x-ray:  No acute findings.    CT of the head:  No acute findings.     CT of the cervical spine:  No acute findings.    Left humerus x-ray:  No acute findings.    Glucose 136, otherwise BMP normal.    Hemoglobin 10.2, MCV 70.4, otherwise CBC normal.    ASSESSMENT/PLAN:  Alzheimer's dementia.  Advanced.  Continue current medications.    Renovascular hypertension.  Request blood pressure charting.  Continue current medications.    Chronic kidney disease stage IV.  Reassess renal functions.     Constipation.  Continue current laxatives.    Arthritis.  Continue pain medications.    Microcytic anemia.  Check iron studies.  Reassess hemoglobin level.    Check CBC and BMP.    I have reviewed patient's medical records received at admission/from hospitalization.  CPT CODE: 91478

## 2013-01-25 ENCOUNTER — Non-Acute Institutional Stay (SKILLED_NURSING_FACILITY): Payer: Medicare Other | Admitting: Internal Medicine

## 2013-01-25 ENCOUNTER — Encounter: Payer: Self-pay | Admitting: Internal Medicine

## 2013-01-25 DIAGNOSIS — I150 Renovascular hypertension: Secondary | ICD-10-CM

## 2013-01-25 DIAGNOSIS — F0280 Dementia in other diseases classified elsewhere without behavioral disturbance: Secondary | ICD-10-CM

## 2013-01-25 DIAGNOSIS — D631 Anemia in chronic kidney disease: Secondary | ICD-10-CM

## 2013-01-25 DIAGNOSIS — N184 Chronic kidney disease, stage 4 (severe): Secondary | ICD-10-CM

## 2013-01-25 NOTE — Progress Notes (Signed)
        PROGRESS NOTE  DATE: 01/25/2013   FACILITY: [facility]  LEVEL OF CARE: SNF (31)  Discharge Visit  CHIEF COMPLAINT:  Manage Alzheimer's dementia and renovascular hypertension  HISTORY OF PRESENT ILLNESS: I was requested by the social worker to perform face-to-face evaluation for discharge:  Patient was admitted to this facility for short-term rehabilitation after the patient's recent hospitalization for acute metabolic encephalopathy.  Patient has completed SNF rehabilitation and therapy has cleared the patient for discharge on 01-26-13.  Reassessment of ongoing problem(s):  DEMENTIA: The dementia remaines stable and continues to function adequately in the current living environment with supervision.  The patient has had little changes in behavior. No complications noted from the medications presently being used.  HTN: Pt 's HTN remains stable.  Denies CP, sob, DOE, pedal edema, headaches, dizziness or visual disturbances.  No complications from the medications currently being used.  Last BP : 128/78  PAST MEDICAL HISTORY : Reviewed.  No changes.  CURRENT MEDICATIONS: Reviewed per [facility]  REVIEW OF SYSTEMS:  GENERAL: no change in appetite, no fatigue, no weight changes, no fever, chills or weakness RESPIRATORY: no cough, SOB, DOE, wheezing, hemoptysis CARDIAC: no chest pain, edema or palpitations GI: no abdominal pain, diarrhea, constipation, heart burn, nausea or vomiting  PHYSICAL EXAMINATION  VS:  T 97.6      P 78      RR 16      BP 128/78      POX %       WT (Lb) 98.8  GENERAL: no acute distress, normal body habitus EYES: conjunctivae normal, sclerae normal, normal eye lids NECK: supple, trachea midline, no neck masses, no thyroid tenderness, no thyromegaly LYMPHATICS: no LAN in the neck, no supraclavicular LAN RESPIRATORY: breathing is even & unlabored, BS CTAB CARDIAC: RRR, no murmur,no extra heart sounds, no edema GI: abdomen soft,  normal BS, no masses, no tenderness, no hepatomegaly, no splenomegaly PSYCHIATRIC: the patient is alert & oriented to person, affect & behavior appropriate  LABS/RADIOLOGY:  11-14 WBC 5.2, hemoglobin 9.5, MCV 71.4, platelets 415, BMP normal  ASSESSMENT/PLAN:  Alzheimer's dementia-stable Renovascular hypertension-stable Anemia of chronic kidney disease-stable Chronic kidney disease stage IV-renal functions normal Osteoarthritis-denies pain  I have filled out patient's discharge paperwork and written prescriptions.  Patient will receive home health PT, OT,nursing and CNA. DME provided: Standard wheelchair (728.87, 781.2)  Total discharge time: Greater than 30 minutes Discharge time involved coordination of the discharge process with social worker, nursing staff and therapy department. Medical justification for home health services/DME verified.  CPT CODE: 16109

## 2013-02-01 DIAGNOSIS — F411 Generalized anxiety disorder: Secondary | ICD-10-CM | POA: Insufficient documentation

## 2013-02-01 DIAGNOSIS — D473 Essential (hemorrhagic) thrombocythemia: Secondary | ICD-10-CM | POA: Insufficient documentation

## 2013-02-01 NOTE — Progress Notes (Signed)
Patient ID: [name] Ramos, female   DOB: Jul 10, 1916, 79 y.o.   MRN: [id number]        PROGRESS NOTE  DATE: 12/28/2012    FACILITY:  [facility]  LEVEL OF CARE: SNF (31)  Acute Visit  CHIEF COMPLAINT:  Manage anemia of chronic kidney disease, anxiety, and thrombocytosis.    HISTORY OF PRESENT ILLNESS: I was requested by the staff to assess the patient regarding above problem(s):  ANEMIA: The anemia is unstable. The patient denies fatigue, melena or hematochezia.  The patient is currently not on iron.   On 12/25/2012:  Hemoglobin 9.5, MCV 71.4.   On 12/17/2012:  Hemoglobin 10.4.  ANXIETY:   New problem.  Staff report that patient has been reporting anxious spells, although today she denies any anxiety.      THROMBOCYTOSIS:  New problem.   On 12/25/2012:  Platelet count 415.  On 12/17/2012:  Platelet count 253.    PAST MEDICAL HISTORY : Reviewed.  No changes.  CURRENT MEDICATIONS: Reviewed per [facility]  REVIEW OF SYSTEMS:  GENERAL: no change in appetite, no fatigue, no weight changes, no fever, chills or weakness RESPIRATORY: no cough, SOB, DOE,, wheezing, hemoptysis CARDIAC: no chest pain, edema or palpitations GI: no abdominal pain, diarrhea, constipation, heart burn, nausea or vomiting  PHYSICAL EXAMINATION  GENERAL: no acute distress, normal body habitus EYES: conjunctivae normal, sclerae normal, normal eye lids NECK: supple, trachea midline, no neck masses, no thyroid tenderness, no thyromegaly LYMPHATICS: no LAN in the neck, no supraclavicular LAN RESPIRATORY: breathing is even & unlabored, BS CTAB CARDIAC: RRR, no murmur,no extra heart sounds, no edema GI: abdomen soft, normal BS, no masses, no tenderness, no hepatomegaly, no splenomegaly PSYCHIATRIC: the patient is alert & oriented to person, affect & behavior appropriate  ASSESSMENT/PLAN:  Anemia of chronic kidney disease.  Unstable problem.  Hemoglobin declined.  We will reassess.  Also, check  iron studies due to microcytosis.    Anxiety.  New problem.  Start lorazepam 0.5 mg b.i.d. p.r.n.    Thrombocytosis.  New problem.  Likely acute phase reactant.  We will monitor.     CPT CODE: 40981

## 2013-05-22 ENCOUNTER — Encounter: Payer: Self-pay | Admitting: *Deleted

## 2013-07-26 ENCOUNTER — Ambulatory Visit: Payer: Medicare Other | Admitting: Interventional Cardiology

## 2013-08-12 ENCOUNTER — Ambulatory Visit (INDEPENDENT_AMBULATORY_CARE_PROVIDER_SITE_OTHER): Payer: Medicare Other | Admitting: Interventional Cardiology

## 2013-08-12 ENCOUNTER — Encounter: Payer: Self-pay | Admitting: Interventional Cardiology

## 2013-08-12 VITALS — BP 144/61 | HR 61 | Ht 59.0 in | Wt 106.0 lb

## 2013-08-12 DIAGNOSIS — I471 Supraventricular tachycardia: Secondary | ICD-10-CM

## 2013-08-12 DIAGNOSIS — I498 Other specified cardiac arrhythmias: Secondary | ICD-10-CM

## 2013-08-12 DIAGNOSIS — I10 Essential (primary) hypertension: Secondary | ICD-10-CM

## 2013-08-12 NOTE — Patient Instructions (Signed)
Your physician recommends that you continue on your current medications as directed. Please refer to the Current Medication list given to you today.  Your physician wants you to follow-up in: 1 year with Dr. Varanasi. You will receive a reminder letter in the mail two months in advance. If you don't receive a letter, please call our office to schedule the follow-up appointment.  

## 2013-08-12 NOTE — Progress Notes (Signed)
Patient ID: [name] Ramos, female   DOB: May 12, 1916, 80 y.o.   MRN: [id number]    [address] [place], [place]  [address] Phone: [phone] Fax:  [phone]  Date:  08/12/2013   ID:  [name] Ramos, DOB Jun 25, 1916, MRN [id number]  PCP:  Horton Finer, MD      History of Present Illness: [name] Ramos is a 80 y.o. female who had SVT. No significant chest pain or palpitations. She has DOE, unchanged.  She was hospitalized in October of 2014 and is now in assisted living.  She enjoys it there.     Wt Readings from Last 3 Encounters:  08/12/13 106 lb (48.081 kg)  12/14/12 87 lb 1.3 oz (39.5 kg)     Past Medical History  Diagnosis Date  . Alzheimer disease   . Hypertension   . Diabetes mellitus without complication   . Arthritis   . Hearing impaired   . Cataracts, bilateral   . Recurrent falls   . Hemorrhoids   . Chronic kidney disease, stage IV (severe) 12/14/2012  . Fecal impaction 12/15/2012  . Protein-calorie malnutrition, severe 12/16/2012  . Underweight 12/17/2012    Current Outpatient Prescriptions  Medication Sig Dispense Refill  . amLODipine-valsartan (EXFORGE) 10-160 MG per tablet Take 1 tablet by mouth daily.      Marland Kitchen aspirin EC 81 MG tablet Take 162 mg by mouth daily.      . capsaicin (ZOSTRIX) 0.025 % cream Apply topically 2 (two) times daily.  60 g  0  . clotrimazole-betamethasone (LOTRISONE) cream Apply 1 application topically 2 (two) times daily.      . diclofenac sodium (VOLTAREN) 1 % GEL Apply 1 application topically 2 (two) times daily as needed (For pain.).       Marland Kitchen docusate sodium 100 MG CAPS Take 100 mg by mouth 2 (two) times daily.  10 capsule  0  . feeding supplement, ENSURE COMPLETE, (ENSURE COMPLETE) LIQD Take 237 mLs by mouth 2 (two) times daily between meals.      . hydrocortisone (ANUSOL-HC) 2.5 % rectal cream Place rectally 2 (two) times daily as needed.  30 g  0  . LORazepam (ATIVAN) 0.5 MG tablet       . metoprolol  succinate (TOPROL-XL) 25 MG 24 hr tablet       . mupirocin ointment (BACTROBAN) 2 % Apply topically 2 (two) times daily.  22 g  0  . OVER THE COUNTER MEDICATION Take 1 each by mouth daily as needed (For mouth or throat.). Vitamin C 60mg  Drops      . OVER THE COUNTER MEDICATION Take 1 each by mouth daily as needed (For mouth or throat.). Honey Lemon Cough Drops 8%       No current facility-administered medications for this visit.    Allergies:    Allergies  Allergen Reactions  . Ace Inhibitors Other (See Comments)    Reaction unknown  . Codeine Other (See Comments)    Reaction unknown   . Penicillins Other (See Comments)    Reaction unknown   . Sulfa Antibiotics Other (See Comments)    Reaction unknown   . Tramadol Other (See Comments)    Reaction unknown     Social History:  The patient  reports that she has never smoked. She has never used smokeless tobacco. She reports that she does not drink alcohol or use illicit drugs.   Family History:  The patient's family history includes Breast cancer  in an other family member; Dementia in her sister; Diabetes in an other family member; High blood pressure in an other family member. There is no history of Colon cancer.   ROS:  Please see the history of present illness.  No nausea, vomiting.  No fevers, chills.  No focal weakness.  No dysuria.  All other systems reviewed and negative.   PHYSICAL EXAM: VS:  BP 144/61  Pulse 61  Ht 4\' 11"  (1.499 m)  Wt 106 lb (48.081 kg)  BMI 21.40 kg/m2 Well nourished, well developed, in no acute distress HEENT: normal Neck: no JVD, no carotid bruits Cardiac:  normal S1, S2; RRR;  Lungs:  clear to auscultation bilaterally, no wheezing, rhonchi or rales Abd: soft, nontender, no hepatomegaly Ext: no edema Skin: warm and dry Neuro:   no focal abnormalities noted  EKG:      ASSESSMENT AND PLAN:  SVT/PAT  Continue Metoprolol Succinate Tablet Extended Release 24 Hour, 25 MG, 1/2 tablet, Orally,  Once a day Notes: No prolonged palpitations. Daughter stays with her frequently. SHe has some friends in the area as well.  2. Essential hypertension, benign  Continue Exforge Tablet, 10-160 MG, 1 tablet, Orally, Once a day Notes: Controlled.    Signed, Mina Marble, MD, [facility] 08/12/2013 4:26 PM

## 2013-10-01 ENCOUNTER — Emergency Department (HOSPITAL_BASED_OUTPATIENT_CLINIC_OR_DEPARTMENT_OTHER): Payer: Medicare Other

## 2013-10-01 ENCOUNTER — Emergency Department (HOSPITAL_BASED_OUTPATIENT_CLINIC_OR_DEPARTMENT_OTHER)
Admission: EM | Admit: 2013-10-01 | Discharge: 2013-10-01 | Disposition: A | Discharge status: Home / Self Care | Payer: Medicare Other | Attending: Emergency Medicine | Admitting: Emergency Medicine

## 2013-10-01 ENCOUNTER — Encounter (HOSPITAL_BASED_OUTPATIENT_CLINIC_OR_DEPARTMENT_OTHER): Payer: Self-pay | Admitting: Emergency Medicine

## 2013-10-01 DIAGNOSIS — Y9210 Unspecified residential institution as the place of occurrence of the external cause: Secondary | ICD-10-CM | POA: Diagnosis not present

## 2013-10-01 DIAGNOSIS — S42209A Unspecified fracture of upper end of unspecified humerus, initial encounter for closed fracture: Secondary | ICD-10-CM | POA: Insufficient documentation

## 2013-10-01 DIAGNOSIS — S46909A Unspecified injury of unspecified muscle, fascia and tendon at shoulder and upper arm level, unspecified arm, initial encounter: Secondary | ICD-10-CM | POA: Insufficient documentation

## 2013-10-01 DIAGNOSIS — W19XXXA Unspecified fall, initial encounter: Secondary | ICD-10-CM

## 2013-10-01 DIAGNOSIS — I120 Hypertensive chronic kidney disease with stage 5 chronic kidney disease or end stage renal disease: Secondary | ICD-10-CM | POA: Diagnosis not present

## 2013-10-01 DIAGNOSIS — E119 Type 2 diabetes mellitus without complications: Secondary | ICD-10-CM | POA: Insufficient documentation

## 2013-10-01 DIAGNOSIS — Z79899 Other long term (current) drug therapy: Secondary | ICD-10-CM | POA: Insufficient documentation

## 2013-10-01 DIAGNOSIS — Y9389 Activity, other specified: Secondary | ICD-10-CM | POA: Insufficient documentation

## 2013-10-01 DIAGNOSIS — Z880 Allergy status to penicillin: Secondary | ICD-10-CM | POA: Diagnosis not present

## 2013-10-01 DIAGNOSIS — IMO0002 Reserved for concepts with insufficient information to code with codable children: Secondary | ICD-10-CM | POA: Diagnosis not present

## 2013-10-01 DIAGNOSIS — Z9181 History of falling: Secondary | ICD-10-CM | POA: Diagnosis not present

## 2013-10-01 DIAGNOSIS — Z791 Long term (current) use of non-steroidal anti-inflammatories (NSAID): Secondary | ICD-10-CM | POA: Insufficient documentation

## 2013-10-01 DIAGNOSIS — S0990XA Unspecified injury of head, initial encounter: Secondary | ICD-10-CM | POA: Insufficient documentation

## 2013-10-01 DIAGNOSIS — N184 Chronic kidney disease, stage 4 (severe): Secondary | ICD-10-CM | POA: Insufficient documentation

## 2013-10-01 DIAGNOSIS — Z7982 Long term (current) use of aspirin: Secondary | ICD-10-CM | POA: Insufficient documentation

## 2013-10-01 DIAGNOSIS — S4980XA Other specified injuries of shoulder and upper arm, unspecified arm, initial encounter: Secondary | ICD-10-CM | POA: Insufficient documentation

## 2013-10-01 DIAGNOSIS — W1809XA Striking against other object with subsequent fall, initial encounter: Secondary | ICD-10-CM | POA: Insufficient documentation

## 2013-10-01 DIAGNOSIS — S4291XA Fracture of right shoulder girdle, part unspecified, initial encounter for closed fracture: Secondary | ICD-10-CM

## 2013-10-01 LAB — CBC WITH DIFFERENTIAL/PLATELET
Basophils Absolute: 0 10*3/uL (ref 0.0–0.1)
Basophils Relative: 1 % (ref 0–1)
EOS ABS: 0.3 10*3/uL (ref 0.0–0.7)
EOS PCT: 5 % (ref 0–5)
HEMATOCRIT: 33.3 % — AB (ref 36.0–46.0)
Hemoglobin: 11 g/dL — ABNORMAL LOW (ref 12.0–15.0)
Lymphocytes Relative: 31 % (ref 12–46)
Lymphs Abs: 1.7 10*3/uL (ref 0.7–4.0)
MCH: 23.8 pg — ABNORMAL LOW (ref 26.0–34.0)
MCHC: 33 g/dL (ref 30.0–36.0)
MCV: 72.1 fL — ABNORMAL LOW (ref 78.0–100.0)
MONO ABS: 0.7 10*3/uL (ref 0.1–1.0)
Monocytes Relative: 13 % — ABNORMAL HIGH (ref 3–12)
Neutro Abs: 2.8 10*3/uL (ref 1.7–7.7)
Neutrophils Relative %: 50 % (ref 43–77)
Platelets: 287 10*3/uL (ref 150–400)
RBC: 4.62 MIL/uL (ref 3.87–5.11)
RDW: 16.3 % — AB (ref 11.5–15.5)
WBC: 5.5 10*3/uL (ref 4.0–10.5)

## 2013-10-01 LAB — BASIC METABOLIC PANEL
ANION GAP: 12 (ref 5–15)
BUN: 24 mg/dL — ABNORMAL HIGH (ref 6–23)
CALCIUM: 9.3 mg/dL (ref 8.4–10.5)
CO2: 25 mEq/L (ref 19–32)
Chloride: 98 mEq/L (ref 96–112)
Creatinine, Ser: 0.9 mg/dL (ref 0.50–1.10)
GFR calc Af Amer: 61 mL/min — ABNORMAL LOW (ref >90)
GFR, EST NON AFRICAN AMERICAN: 53 mL/min — AB (ref >90)
GLUCOSE: 197 mg/dL — AB (ref 70–99)
Potassium: 4.8 mEq/L (ref 3.7–5.3)
Sodium: 135 mEq/L — ABNORMAL LOW (ref 137–147)

## 2013-10-01 NOTE — ED Notes (Signed)
Ambulated in hall with walker>>tolerated very well

## 2013-10-01 NOTE — ED Notes (Signed)
Fell at nursing home rt side of body struck wall

## 2013-10-01 NOTE — ED Notes (Signed)
Ready and waiting for [facility]

## 2013-10-01 NOTE — Discharge Instructions (Signed)
Patient with question of right shoulder greater tubercle fracture but is using shoulder well here and immobilizing it would prohibit her from walking with walker.  No other fracture or injury found.  Please have her doctor recheck her asap.

## 2013-10-01 NOTE — ED Notes (Signed)
Fell at nursing home.  Pain rt upper arm/shoulder

## 2013-10-01 NOTE — ED Notes (Signed)
[facility] arrived to transport pt back to SNF.

## 2013-10-01 NOTE — ED Notes (Signed)
Attempted to call report .  Called [facility] >doesn't live there .  Called [facility]  No answer

## 2013-10-01 NOTE — ED Notes (Signed)
Called placed to [facility] for transport back to [facility] spoke with rep Kasandra Knudsen, stated that he would have someone out ASAP

## 2013-10-01 NOTE — ED Provider Notes (Addendum)
CSN: [id number]     Arrival date & time 10/01/13  1507 History   First MD Initiated Contact with Patient 10/01/13 1529     Chief Complaint  Patient presents with  . Fall   Level 5 caveat  (Consider location/radiation/quality/duration/timing/severity/associated sxs/prior Treatment) HPI 80 y.o. Female from [facility] with report from ems that she fell.  Patient with reported history of dementia but seems to have recall of event that is consistent with history that ems provided.  She states she was in dining room with walker when she slipped on water and fell to right hitting shoulder- the she is unclear exactly what happened at that point.  SHe thinks she struck her head, denies loc.  She is unable to tell me if she got up alone or with assistance or was able to bear weight.  She seems to have pain to right side of head, right shoulder. She denies other complaints such as fever, weakness, abdominal pain, or other pain.   Past Medical History  Diagnosis Date  . Alzheimer disease   . Hypertension   . Diabetes mellitus without complication   . Arthritis   . Hearing impaired   . Cataracts, bilateral   . Recurrent falls   . Hemorrhoids   . Chronic kidney disease, stage IV (severe) 12/14/2012  . Fecal impaction 12/15/2012  . Protein-calorie malnutrition, severe 12/16/2012  . Underweight 12/17/2012   Past Surgical History  Procedure Laterality Date  . Tonsillectomy    . Appendectomy    . Cataract extraction, bilateral     Family History  Problem Relation Age of Onset  . Colon cancer Neg Hx   . Breast cancer      multiple  . Dementia Sister   . High blood pressure      multiple  . Diabetes      multiple   History  Substance Use Topics  . Smoking status: Never Smoker   . Smokeless tobacco: Never Used  . Alcohol Use: No   OB History   Grav Para Term Preterm Abortions TAB SAB Ect Mult Living                 Review of Systems  All other systems reviewed and are  negative.     Allergies  Ace inhibitors; Codeine; Penicillins; Sulfa antibiotics; and Tramadol  Home Medications   Prior to Admission medications   Medication Sig Start Date End Date Taking? Authorizing Provider  amLODipine-valsartan (EXFORGE) 10-160 MG per tablet Take 1 tablet by mouth daily.    Historical Provider, MD  aspirin EC 81 MG tablet Take 162 mg by mouth daily.    Historical Provider, MD  capsaicin (ZOSTRIX) 0.025 % cream Apply topically 2 (two) times daily. 12/18/12   [name], MD  clotrimazole-betamethasone (LOTRISONE) cream Apply 1 application topically 2 (two) times daily.    Historical Provider, MD  diclofenac sodium (VOLTAREN) 1 % GEL Apply 1 application topically 2 (two) times daily as needed (For pain.).     Historical Provider, MD  docusate sodium 100 MG CAPS Take 100 mg by mouth 2 (two) times daily. 12/18/12   [name], MD  LORazepam (ATIVAN) 0.5 MG tablet  08/11/13   Historical Provider, MD  metoprolol succinate (TOPROL-XL) 25 MG 24 hr tablet  08/09/13   Historical Provider, MD  OVER THE COUNTER MEDICATION Take 1 each by mouth daily as needed (For mouth or throat.). Honey Lemon Cough Drops 8%    Historical Provider, MD  BP 143/54  Pulse 64  Temp(Src) 98.5 F (36.9 C) (Oral)  Resp 24  SpO2 99% Physical Exam  Nursing note and vitals reviewed. Constitutional: She is oriented to person, place, and time. She appears well-developed and well-nourished.  HENT:  Head: Normocephalic and atraumatic.  Right Ear: External ear normal.  Left Ear: External ear normal.  Nose: Nose normal.  Mouth/Throat: Oropharynx is clear and moist.  Eyes: Conjunctivae and EOM are normal.  Bilateral cataracts  Neck: Normal range of motion.  Cardiovascular: Normal rate, regular rhythm, normal heart sounds and intact distal pulses.   Pulmonary/Chest: Effort normal and breath sounds normal.  Abdominal: Soft. Bowel sounds are normal.  Musculoskeletal: Normal range of  motion.  Bilateral hip and knee ttp with no external signs of deformity, redness, swelling and full arom  Neurological: She is alert and oriented to person, place, and time. She has normal reflexes. She displays normal reflexes. No cranial nerve deficit. She exhibits normal muscle tone. Coordination normal.  Thinks she is at [facility] and knows the year.   Skin: Skin is warm and dry.  Psychiatric: She has a normal mood and affect. Her behavior is normal.    ED Course  Procedures (including critical care time) Labs Review Labs Reviewed  BASIC METABOLIC PANEL - Abnormal; Notable for the following:    Sodium 135 (*)    Glucose, Bld 197 (*)    BUN 24 (*)    GFR calc non Af Amer 53 (*)    GFR calc Af Amer 61 (*)    All other components within normal limits  CBC WITH DIFFERENTIAL - Abnormal; Notable for the following:    Hemoglobin 11.0 (*)    HCT 33.3 (*)    MCV 72.1 (*)    MCH 23.8 (*)    RDW 16.3 (*)    Monocytes Relative 13 (*)    All other components within normal limits    Imaging Review No results found.   EKG Interpretation None      MDM   Final diagnoses:  Fall, initial encounter  Shoulder fracture, right, closed, initial encounter    80 y.o. Female who had witnessed fall at [facility] complaining of pain at shoulder and hips.  Patient with question of avulsion fx seen on shoulder x-Areonna Bran right but now moving shoulder well and ambulating with walker.  No evidence of acute ic abnormality or neck fx. BS elevated and mild anemia at 11 but none of these would have appeared to contribute to fall and appear chronic.  Given patient's age and need for walker plan no shoulder immobilization and advise follow up.     Shaune Pollack, MD 10/01/13 1832 Addendum   Shaune Pollack, MD 10/15/13 [phone]

## 2013-10-04 ENCOUNTER — Emergency Department (HOSPITAL_BASED_OUTPATIENT_CLINIC_OR_DEPARTMENT_OTHER)
Admission: EM | Admit: 2013-10-04 | Discharge: 2013-10-04 | Disposition: A | Discharge status: Home / Self Care | Payer: Medicare Other | Attending: Emergency Medicine | Admitting: Emergency Medicine

## 2013-10-04 ENCOUNTER — Encounter (HOSPITAL_BASED_OUTPATIENT_CLINIC_OR_DEPARTMENT_OTHER): Payer: Self-pay | Admitting: Emergency Medicine

## 2013-10-04 ENCOUNTER — Emergency Department (HOSPITAL_BASED_OUTPATIENT_CLINIC_OR_DEPARTMENT_OTHER): Payer: Medicare Other

## 2013-10-04 DIAGNOSIS — Z8719 Personal history of other diseases of the digestive system: Secondary | ICD-10-CM | POA: Insufficient documentation

## 2013-10-04 DIAGNOSIS — Z8669 Personal history of other diseases of the nervous system and sense organs: Secondary | ICD-10-CM | POA: Diagnosis not present

## 2013-10-04 DIAGNOSIS — Z9181 History of falling: Secondary | ICD-10-CM | POA: Diagnosis not present

## 2013-10-04 DIAGNOSIS — Z880 Allergy status to penicillin: Secondary | ICD-10-CM | POA: Insufficient documentation

## 2013-10-04 DIAGNOSIS — G309 Alzheimer's disease, unspecified: Secondary | ICD-10-CM | POA: Insufficient documentation

## 2013-10-04 DIAGNOSIS — M129 Arthropathy, unspecified: Secondary | ICD-10-CM | POA: Diagnosis not present

## 2013-10-04 DIAGNOSIS — I129 Hypertensive chronic kidney disease with stage 1 through stage 4 chronic kidney disease, or unspecified chronic kidney disease: Secondary | ICD-10-CM | POA: Diagnosis not present

## 2013-10-04 DIAGNOSIS — N184 Chronic kidney disease, stage 4 (severe): Secondary | ICD-10-CM | POA: Insufficient documentation

## 2013-10-04 DIAGNOSIS — E119 Type 2 diabetes mellitus without complications: Secondary | ICD-10-CM | POA: Insufficient documentation

## 2013-10-04 DIAGNOSIS — Y939 Activity, unspecified: Secondary | ICD-10-CM | POA: Insufficient documentation

## 2013-10-04 DIAGNOSIS — Z79899 Other long term (current) drug therapy: Secondary | ICD-10-CM | POA: Insufficient documentation

## 2013-10-04 DIAGNOSIS — F0280 Dementia in other diseases classified elsewhere without behavioral disturbance: Secondary | ICD-10-CM | POA: Diagnosis not present

## 2013-10-04 DIAGNOSIS — IMO0002 Reserved for concepts with insufficient information to code with codable children: Secondary | ICD-10-CM | POA: Insufficient documentation

## 2013-10-04 DIAGNOSIS — Z862 Personal history of diseases of the blood and blood-forming organs and certain disorders involving the immune mechanism: Secondary | ICD-10-CM | POA: Insufficient documentation

## 2013-10-04 DIAGNOSIS — R296 Repeated falls: Secondary | ICD-10-CM | POA: Insufficient documentation

## 2013-10-04 DIAGNOSIS — W19XXXA Unspecified fall, initial encounter: Secondary | ICD-10-CM

## 2013-10-04 DIAGNOSIS — Y929 Unspecified place or not applicable: Secondary | ICD-10-CM | POA: Insufficient documentation

## 2013-10-04 DIAGNOSIS — Z8639 Personal history of other endocrine, nutritional and metabolic disease: Secondary | ICD-10-CM | POA: Insufficient documentation

## 2013-10-04 DIAGNOSIS — Z7982 Long term (current) use of aspirin: Secondary | ICD-10-CM | POA: Insufficient documentation

## 2013-10-04 LAB — CBC WITH DIFFERENTIAL/PLATELET
Basophils Absolute: 0 10*3/uL (ref 0.0–0.1)
Basophils Relative: 0 % (ref 0–1)
Eosinophils Absolute: 0.2 10*3/uL (ref 0.0–0.7)
Eosinophils Relative: 5 % (ref 0–5)
HEMATOCRIT: 34.6 % — AB (ref 36.0–46.0)
Hemoglobin: 11.5 g/dL — ABNORMAL LOW (ref 12.0–15.0)
Lymphocytes Relative: 35 % (ref 12–46)
Lymphs Abs: 1.6 10*3/uL (ref 0.7–4.0)
MCH: 23.8 pg — ABNORMAL LOW (ref 26.0–34.0)
MCHC: 33.2 g/dL (ref 30.0–36.0)
MCV: 71.5 fL — ABNORMAL LOW (ref 78.0–100.0)
MONO ABS: 0.7 10*3/uL (ref 0.1–1.0)
Monocytes Relative: 16 % — ABNORMAL HIGH (ref 3–12)
NEUTROS ABS: 2 10*3/uL (ref 1.7–7.7)
Neutrophils Relative %: 43 % (ref 43–77)
Platelets: 272 10*3/uL (ref 150–400)
RBC: 4.84 MIL/uL (ref 3.87–5.11)
RDW: 16.3 % — ABNORMAL HIGH (ref 11.5–15.5)
WBC: 4.5 10*3/uL (ref 4.0–10.5)

## 2013-10-04 LAB — BASIC METABOLIC PANEL
ANION GAP: 12 (ref 5–15)
BUN: 26 mg/dL — ABNORMAL HIGH (ref 6–23)
CALCIUM: 9.8 mg/dL (ref 8.4–10.5)
CO2: 26 mEq/L (ref 19–32)
CREATININE: 0.9 mg/dL (ref 0.50–1.10)
Chloride: 100 mEq/L (ref 96–112)
GFR calc Af Amer: 61 mL/min — ABNORMAL LOW (ref >90)
GFR calc non Af Amer: 53 mL/min — ABNORMAL LOW (ref >90)
Glucose, Bld: 109 mg/dL — ABNORMAL HIGH (ref 70–99)
Potassium: 5 mEq/L (ref 3.7–5.3)
Sodium: 138 mEq/L (ref 137–147)

## 2013-10-04 NOTE — ED Notes (Signed)
Patient transported to CT 

## 2013-10-04 NOTE — ED Notes (Signed)
[facility] called for transport.  

## 2013-10-04 NOTE — ED Notes (Signed)
Found lying in floor with walker on top of her by nursing home staff.  Pt is c/o back pain.

## 2013-10-04 NOTE — ED Provider Notes (Signed)
CSN: [id number]     Arrival date & time 10/04/13  4174 History   First MD Initiated Contact with Patient 10/04/13 0831     Chief Complaint  Patient presents with  . Fall     (Consider location/radiation/quality/duration/timing/severity/associated sxs/prior Treatment) Patient is a 80 y.o. female presenting with fall. The history is provided by the EMS personnel and the nursing home. The history is limited by the condition of the patient.  Fall   patient has history of Alzheimer's and very hard of hearing patient would be a level V caveat for the history. All history obtained was from the nursing home or EMS. Patient was seen here on August 14 for a fall at that time had head CT neck CT and x-ray of the shoulder. Patient also had basic labs. Patient found by nursing home personnel on her back with her walker on top of her.   Past Medical History  Diagnosis Date  . Alzheimer disease   . Hypertension   . Diabetes mellitus without complication   . Arthritis   . Hearing impaired   . Cataracts, bilateral   . Recurrent falls   . Hemorrhoids   . Chronic kidney disease, stage IV (severe) 12/14/2012  . Fecal impaction 12/15/2012  . Protein-calorie malnutrition, severe 12/16/2012  . Underweight 12/17/2012   Past Surgical History  Procedure Laterality Date  . Tonsillectomy    . Appendectomy    . Cataract extraction, bilateral     Family History  Problem Relation Age of Onset  . Colon cancer Neg Hx   . Breast cancer      multiple  . Dementia Sister   . High blood pressure      multiple  . Diabetes      multiple   History  Substance Use Topics  . Smoking status: Never Smoker   . Smokeless tobacco: Never Used  . Alcohol Use: No   OB History   Grav Para Term Preterm Abortions TAB SAB Ect Mult Living                 Review of Systems  Unable to perform ROS  patient reportedly very hard of hearing patient also has a history of Alzheimer's disease.    Allergies  Ace  inhibitors; Codeine; Penicillins; Sulfa antibiotics; and Tramadol  Home Medications   Prior to Admission medications   Medication Sig Start Date End Date Taking? Authorizing Provider  amLODipine-valsartan (EXFORGE) 10-160 MG per tablet Take 1 tablet by mouth daily.    Historical Provider, MD  aspirin EC 81 MG tablet Take 162 mg by mouth daily.    Historical Provider, MD  capsaicin (ZOSTRIX) 0.025 % cream Apply topically 2 (two) times daily. 12/18/12   [name], MD  clotrimazole-betamethasone (LOTRISONE) cream Apply 1 application topically 2 (two) times daily.    Historical Provider, MD  diclofenac sodium (VOLTAREN) 1 % GEL Apply 1 application topically 2 (two) times daily as needed (For pain.).     Historical Provider, MD  docusate sodium 100 MG CAPS Take 100 mg by mouth 2 (two) times daily. 12/18/12   [name], MD  LORazepam (ATIVAN) 0.5 MG tablet  08/11/13   Historical Provider, MD  metoprolol succinate (TOPROL-XL) 25 MG 24 hr tablet  08/09/13   Historical Provider, MD  OVER THE COUNTER MEDICATION Take 1 each by mouth daily as needed (For mouth or throat.). Honey Lemon Cough Drops 8%    Historical Provider, MD   BP 178/68  Pulse 98  Temp(Src) 98.7 F (37.1 C) (Oral)  Resp 20  Wt 106 lb (48.081 kg)  SpO2 100% Physical Exam  Constitutional: She appears well-developed and well-nourished.  HENT:  Head: Normocephalic.  C-collar in place  Eyes: Conjunctivae and EOM are normal. Pupils are equal, round, and reactive to light.  Neck:  C-collar in place  Cardiovascular: Normal rate, regular rhythm and normal heart sounds.   No murmur heard. Pulmonary/Chest: Effort normal and breath sounds normal. No respiratory distress.  Abdominal: Bowel sounds are normal. There is no tenderness.  Musculoskeletal: Normal range of motion. She exhibits no edema.  Questionable bilateral hip tenderness.  Neurological: She is alert.  Will move all 4 extremities.  Skin: No rash noted.    ED  Course  Procedures (including critical care time) Labs Review Labs Reviewed  CBC WITH DIFFERENTIAL - Abnormal; Notable for the following:    Hemoglobin 11.5 (*)    HCT 34.6 (*)    MCV 71.5 (*)    MCH 23.8 (*)    RDW 16.3 (*)    Monocytes Relative 16 (*)    All other components within normal limits  BASIC METABOLIC PANEL - Abnormal; Notable for the following:    Glucose, Bld 109 (*)    BUN 26 (*)    GFR calc non Af Amer 53 (*)    GFR calc Af Amer 61 (*)    All other components within normal limits   Results for orders placed during the hospital encounter of 10/04/13  CBC WITH DIFFERENTIAL      Result Value Ref Range   WBC 4.5  4.0 - 10.5 K/uL   RBC 4.84  3.87 - 5.11 MIL/uL   Hemoglobin 11.5 (*) 12.0 - 15.0 g/dL   HCT 34.6 (*) 36.0 - 46.0 %   MCV 71.5 (*) 78.0 - 100.0 fL   MCH 23.8 (*) 26.0 - 34.0 pg   MCHC 33.2  30.0 - 36.0 g/dL   RDW 16.3 (*) 11.5 - 15.5 %   Platelets 272  150 - 400 K/uL   Neutrophils Relative % 43  43 - 77 %   Neutro Abs 2.0  1.7 - 7.7 K/uL   Lymphocytes Relative 35  12 - 46 %   Lymphs Abs 1.6  0.7 - 4.0 K/uL   Monocytes Relative 16 (*) 3 - 12 %   Monocytes Absolute 0.7  0.1 - 1.0 K/uL   Eosinophils Relative 5  0 - 5 %   Eosinophils Absolute 0.2  0.0 - 0.7 K/uL   Basophils Relative 0  0 - 1 %   Basophils Absolute 0.0  0.0 - 0.1 K/uL  BASIC METABOLIC PANEL      Result Value Ref Range   Sodium 138  137 - 147 mEq/L   Potassium 5.0  3.7 - 5.3 mEq/L   Chloride 100  96 - 112 mEq/L   CO2 26  19 - 32 mEq/L   Glucose, Bld 109 (*) 70 - 99 mg/dL   BUN 26 (*) 6 - 23 mg/dL   Creatinine, Ser 0.90  0.50 - 1.10 mg/dL   Calcium 9.8  8.4 - 10.5 mg/dL   GFR calc non Af Amer 53 (*) >90 mL/min   GFR calc Af Amer 61 (*) >90 mL/min   Anion gap 12  5 - 15     Imaging Review Dg Lumbar Spine Complete  10/04/2013   CLINICAL DATA:  Found down with back pain.  EXAM: LUMBAR SPINE -  COMPLETE 4+ VIEW  COMPARISON:  Sagittal and coronal re-formatted images from CT abdomen  pelvis 12/09/2010.  FINDINGS: 6 mm anterolisthesis of L5 on S1 with slight loss of disc space height and facet hypertrophy, progressive from 12/09/2010. No definite pars defects. Alignment is otherwise anatomic. Vertebral body and disc space height are maintained. Mild scattered endplate degenerative changes in the thoracolumbar spine. Facet hypertrophy throughout the lumbar spine.  Atherosclerotic calcification of the arterial vasculature. Stool is seen throughout the visualized portions of the colon.  IMPRESSION: 1. Grade 1 anterolisthesis of L5 on S1 with degenerative disc disease. Findings are slightly progressive from 12/09/2010. 2. Multilevel spondylosis. 3. Stool throughout the visualized colon is indicative of constipation.   Electronically Signed   By: Lorin Picket M.D.   On: 10/04/2013 09:22   Dg Hip Bilateral W/pelvis  10/04/2013   CLINICAL DATA:  Found on floor, back pain.  EXAM: BILATERAL HIP WITH PELVIS - 4+ VIEW  COMPARISON:  10/01/2013.  FINDINGS: No fracture or dislocation. No degenerative changes in the hips. Degenerative changes are seen in the lower lumbar spine and sacroiliac joints.  IMPRESSION: No acute findings.   Electronically Signed   By: Lorin Picket M.D.   On: 10/04/2013 09:19   Ct Head Wo Contrast  10/04/2013   CLINICAL DATA:  Unwitnessed fall. History of dementia. Altered mental status. Spine pain.  EXAM: CT HEAD WITHOUT CONTRAST  CT CERVICAL SPINE WITHOUT CONTRAST  TECHNIQUE: Multidetector CT imaging of the head and cervical spine was performed following the standard protocol without intravenous contrast. Multiplanar CT image reconstructions of the cervical spine were also generated.  COMPARISON:  CT head and cervical spine 10/01/2013.  FINDINGS: CT HEAD FINDINGS  No evidence for acute infarction, hemorrhage, mass lesion, hydrocephalus, or extra-axial fluid. Generalized cerebral and cerebellar atrophy. Hypoattenuation of white matter consistent with chronic microvascular  ischemic change. No skull fracture or scalp hematoma. No acute sinus or mastoid disease.  CT CERVICAL SPINE FINDINGS  There is no visible cervical spine fracture, traumatic subluxation, prevertebral soft tissue swelling, or intraspinal hematoma. Facet mediated anterolisthesis at C3-4, C4-5, and C7-T1. No odontoid fracture. Severe disc space narrowing C5-C6.  IMPRESSION: No skull fracture or intracranial hemorrhage. Stable age advanced changes as described.  Cervical spine degenerative changes without fracture or traumatic subluxation.  Overall, the appearance of the brain and spine is unchanged from 10/01/2013.   Electronically Signed   By: Rolla Flatten M.D.   On: 10/04/2013 09:11   Ct Cervical Spine Wo Contrast  10/04/2013   CLINICAL DATA:  Unwitnessed fall. History of dementia. Altered mental status. Spine pain.  EXAM: CT HEAD WITHOUT CONTRAST  CT CERVICAL SPINE WITHOUT CONTRAST  TECHNIQUE: Multidetector CT imaging of the head and cervical spine was performed following the standard protocol without intravenous contrast. Multiplanar CT image reconstructions of the cervical spine were also generated.  COMPARISON:  CT head and cervical spine 10/01/2013.  FINDINGS: CT HEAD FINDINGS  No evidence for acute infarction, hemorrhage, mass lesion, hydrocephalus, or extra-axial fluid. Generalized cerebral and cerebellar atrophy. Hypoattenuation of white matter consistent with chronic microvascular ischemic change. No skull fracture or scalp hematoma. No acute sinus or mastoid disease.  CT CERVICAL SPINE FINDINGS  There is no visible cervical spine fracture, traumatic subluxation, prevertebral soft tissue swelling, or intraspinal hematoma. Facet mediated anterolisthesis at C3-4, C4-5, and C7-T1. No odontoid fracture. Severe disc space narrowing C5-C6.  IMPRESSION: No skull fracture or intracranial hemorrhage. Stable age advanced changes as described.  Cervical spine  degenerative changes without fracture or traumatic  subluxation.  Overall, the appearance of the brain and spine is unchanged from 10/01/2013.   Electronically Signed   By: Rolla Flatten M.D.   On: 10/04/2013 09:11     EKG Interpretation   Date/Time:  Monday October 04 2013 08:45:06 EDT Ventricular Rate:  64 PR Interval:  180 QRS Duration: 74 QT Interval:  394 QTC Calculation: 406 R Axis:   71 Text Interpretation:  Normal sinus rhythm T wave abnormality, consider  lateral ischemia Abnormal ECG No significant change since last tracing  Confirmed by Pocahontas Cohenour  MD, [place] [phone]) on 10/04/2013 8:51:08 AM      MDM   Final diagnoses:  Fall, initial encounter    Patient with significant hearing deficit and Alzheimer's disease. Patient with history of recurrent falls. Patient seen August 14 for fall with negative workup. Patient with recurrent fall again today.  Today's evaluation shows no evidence of any injury on head CT neck CT x-rays of the lumbar back or both hips and pelvis. Labs without significant abnormalities. Patient will be discharged back to nursing facility. Labs and x-rays compared to visit on August 14. In addition patient's EKG shows no abnormalities.  Fredia Sorrow, MD 10/04/13 0930

## 2013-10-04 NOTE — Discharge Instructions (Signed)
Workup in the emergency department included CT of head and neck x-rays of the lumbar back and x-rays of both hips without any findings of injury. Patient's CBC and basic metabolic panel are normal. In addition patient's EKG is normal. Patient will be discharged back to nursing home.

## 2013-10-04 NOTE — ED Notes (Signed)
Ptar has been called to return patient to Nursing home

## 2013-10-04 NOTE — ED Notes (Signed)
PTAR at bedside 

## 2014-03-28 ENCOUNTER — Encounter (HOSPITAL_COMMUNITY): Payer: Self-pay | Admitting: Emergency Medicine

## 2014-03-28 ENCOUNTER — Emergency Department (HOSPITAL_COMMUNITY)
Admission: EM | Admit: 2014-03-28 | Discharge: 2014-03-28 | Disposition: A | Discharge status: Home / Self Care | Payer: Medicare Other | Attending: Emergency Medicine | Admitting: Emergency Medicine

## 2014-03-28 ENCOUNTER — Emergency Department (HOSPITAL_COMMUNITY): Payer: Medicare Other

## 2014-03-28 DIAGNOSIS — I129 Hypertensive chronic kidney disease with stage 1 through stage 4 chronic kidney disease, or unspecified chronic kidney disease: Secondary | ICD-10-CM | POA: Diagnosis not present

## 2014-03-28 DIAGNOSIS — Z7952 Long term (current) use of systemic steroids: Secondary | ICD-10-CM | POA: Insufficient documentation

## 2014-03-28 DIAGNOSIS — M7989 Other specified soft tissue disorders: Secondary | ICD-10-CM

## 2014-03-28 DIAGNOSIS — Z9181 History of falling: Secondary | ICD-10-CM | POA: Diagnosis not present

## 2014-03-28 DIAGNOSIS — Z79899 Other long term (current) drug therapy: Secondary | ICD-10-CM | POA: Insufficient documentation

## 2014-03-28 DIAGNOSIS — N184 Chronic kidney disease, stage 4 (severe): Secondary | ICD-10-CM | POA: Diagnosis not present

## 2014-03-28 DIAGNOSIS — F0280 Dementia in other diseases classified elsewhere without behavioral disturbance: Secondary | ICD-10-CM | POA: Diagnosis not present

## 2014-03-28 DIAGNOSIS — H9190 Unspecified hearing loss, unspecified ear: Secondary | ICD-10-CM | POA: Diagnosis not present

## 2014-03-28 DIAGNOSIS — G309 Alzheimer's disease, unspecified: Secondary | ICD-10-CM | POA: Insufficient documentation

## 2014-03-28 DIAGNOSIS — Z8719 Personal history of other diseases of the digestive system: Secondary | ICD-10-CM | POA: Insufficient documentation

## 2014-03-28 DIAGNOSIS — M1990 Unspecified osteoarthritis, unspecified site: Secondary | ICD-10-CM | POA: Insufficient documentation

## 2014-03-28 DIAGNOSIS — Z7982 Long term (current) use of aspirin: Secondary | ICD-10-CM | POA: Insufficient documentation

## 2014-03-28 DIAGNOSIS — E119 Type 2 diabetes mellitus without complications: Secondary | ICD-10-CM | POA: Insufficient documentation

## 2014-03-28 DIAGNOSIS — H269 Unspecified cataract: Secondary | ICD-10-CM | POA: Diagnosis not present

## 2014-03-28 DIAGNOSIS — R609 Edema, unspecified: Secondary | ICD-10-CM | POA: Insufficient documentation

## 2014-03-28 DIAGNOSIS — Z880 Allergy status to penicillin: Secondary | ICD-10-CM | POA: Insufficient documentation

## 2014-03-28 LAB — URINALYSIS, ROUTINE W REFLEX MICROSCOPIC
BILIRUBIN URINE: NEGATIVE
GLUCOSE, UA: NEGATIVE mg/dL
HGB URINE DIPSTICK: NEGATIVE
KETONES UR: NEGATIVE mg/dL
LEUKOCYTES UA: NEGATIVE
NITRITE: NEGATIVE
Protein, ur: NEGATIVE mg/dL
Specific Gravity, Urine: 1.01 (ref 1.005–1.030)
UROBILINOGEN UA: 0.2 mg/dL (ref 0.0–1.0)
pH: 5.5 (ref 5.0–8.0)

## 2014-03-28 LAB — I-STAT CHEM 8, ED
BUN: 21 mg/dL (ref 6–23)
CALCIUM ION: 1.11 mmol/L — AB (ref 1.13–1.30)
Chloride: 94 mmol/L — ABNORMAL LOW (ref 96–112)
Creatinine, Ser: 1 mg/dL (ref 0.50–1.10)
Glucose, Bld: 116 mg/dL — ABNORMAL HIGH (ref 70–99)
HCT: 41 % (ref 36.0–46.0)
Hemoglobin: 13.9 g/dL (ref 12.0–15.0)
Potassium: 4.4 mmol/L (ref 3.5–5.1)
SODIUM: 133 mmol/L — AB (ref 135–145)
TCO2: 26 mmol/L (ref 0–100)

## 2014-03-28 LAB — CBC
HEMATOCRIT: 34.9 % — AB (ref 36.0–46.0)
Hemoglobin: 11.5 g/dL — ABNORMAL LOW (ref 12.0–15.0)
MCH: 23.6 pg — ABNORMAL LOW (ref 26.0–34.0)
MCHC: 33 g/dL (ref 30.0–36.0)
MCV: 71.5 fL — AB (ref 78.0–100.0)
PLATELETS: 325 10*3/uL (ref 150–400)
RBC: 4.88 MIL/uL (ref 3.87–5.11)
RDW: 15.4 % (ref 11.5–15.5)
WBC: 5.8 10*3/uL (ref 4.0–10.5)

## 2014-03-28 LAB — BRAIN NATRIURETIC PEPTIDE: B Natriuretic Peptide: 52.3 pg/mL (ref 0.0–100.0)

## 2014-03-28 MED ORDER — ACETAMINOPHEN 325 MG PO TABS
650.0000 mg | ORAL_TABLET | Freq: Once | ORAL | Status: AC
  Administered 2014-03-28: 650 mg via ORAL
  Filled 2014-03-28: qty 2

## 2014-03-28 MED ORDER — LORAZEPAM 0.5 MG PO TABS
0.5000 mg | ORAL_TABLET | Freq: Once | ORAL | Status: AC
  Administered 2014-03-28: 0.5 mg via ORAL
  Filled 2014-03-28: qty 1

## 2014-03-28 NOTE — Discharge Instructions (Signed)
Peripheral Edema °You have swelling in your legs (peripheral edema). This swelling is due to excess accumulation of salt and water in your body. Edema may be a sign of heart, kidney or liver disease, or a side effect of a medication. It may also be due to problems in the leg veins. Elevating your legs and using special support stockings may be very helpful, if the cause of the swelling is due to poor venous circulation. Avoid long periods of standing, whatever the cause. °Treatment of edema depends on identifying the cause. Chips, pretzels, pickles and other salty foods should be avoided. Restricting salt in your diet is almost always needed. Water pills (diuretics) are often used to remove the excess salt and water from your body via urine. These medicines prevent the kidney from reabsorbing sodium. This increases urine flow. °Diuretic treatment may also result in lowering of potassium levels in your body. Potassium supplements may be needed if you have to use diuretics daily. Daily weights can help you keep track of your progress in clearing your edema. You should call your caregiver for follow up care as recommended. °SEEK IMMEDIATE MEDICAL CARE IF:  °· You have increased swelling, pain, redness, or heat in your legs. °· You develop shortness of breath, especially when lying down. °· You develop chest or abdominal pain, weakness, or fainting. °· You have a fever. °Document Released: 03/14/2004 Document Revised: 04/29/2011 Document Reviewed: 02/22/2009 °[employer]® Patient Information ©2015 [employer]. This information is not intended to replace advice given to you by your health care provider. Make sure you discuss any questions you have with your health care provider. ° °

## 2014-03-28 NOTE — ED Notes (Signed)
Pt was sent here from [place] for bilat leg swelling and to rule out DVT vs worsening renal insufficiency versus cardiac etiology.  Pt's family member unsure how long this been going on due to "i dont live here, she stays at a nursing home". Pt also stating her left shoulder is hurting, pt unable to tell me how long that pain has been going on.

## 2014-03-28 NOTE — ED Provider Notes (Signed)
CSN: [id number]     Arrival date & time 03/28/14  1729 History   First MD Initiated Contact with Patient 03/28/14 2156     Chief Complaint  Patient presents with  . Leg Swelling     (Consider location/radiation/quality/duration/timing/severity/associated sxs/prior Treatment) HPI Comments: 81 yo female presenting with bilateral lower extremity swelling of unclear duration.  Most of history obtained from patient's daughter, as pt has dementia and is very hard of hearing and her hearing aids aren't working. Level V Caveat applies.    Daughter states she has some swelling in both legs about a month ago, when she first took her mother to an orthopedist for bilateral knee pain.  She came back for a visit two days ago and cannot give any additional details about the development of pt's swelling.  She reports that pt has complained of bilateral knee pain.  They went back to her orthopedist today and were told to come to the ED for evaluation of her swelling.  Daughter states she has noisy breathing, but no difficulty breathing.  She is not aware of any fevers, cough, chest pain, or other symptoms.     Past Medical History  Diagnosis Date  . Alzheimer disease   . Hypertension   . Diabetes mellitus without complication   . Arthritis   . Hearing impaired   . Cataracts, bilateral   . Recurrent falls   . Hemorrhoids   . Chronic kidney disease, stage IV (severe) 12/14/2012  . Fecal impaction 12/15/2012  . Protein-calorie malnutrition, severe 12/16/2012  . Underweight 12/17/2012   Past Surgical History  Procedure Laterality Date  . Tonsillectomy    . Appendectomy    . Cataract extraction, bilateral     Family History  Problem Relation Age of Onset  . Colon cancer Neg Hx   . Breast cancer      multiple  . Dementia Sister   . High blood pressure      multiple  . Diabetes      multiple   History  Substance Use Topics  . Smoking status: Never Smoker   . Smokeless tobacco: Never Used  .  Alcohol Use: No   OB History    No data available     Review of Systems  Unable to perform ROS: Dementia      Allergies  Ace inhibitors; Codeine; Penicillins; Sulfa antibiotics; and Tramadol  Home Medications   Prior to Admission medications   Medication Sig Start Date End Date Taking? Authorizing Provider  acetaminophen (TYLENOL) 325 MG tablet Take 650 mg by mouth every 6 (six) hours as needed for moderate pain (pain).   Yes Historical Provider, MD  acetaminophen (TYLENOL) 325 MG tablet Take 650 mg by mouth at bedtime.   Yes Historical Provider, MD  amLODipine-valsartan (EXFORGE) 10-160 MG per tablet Take 1 tablet by mouth daily.   Yes Historical Provider, MD  aspirin EC 81 MG tablet Take 162 mg by mouth daily.   Yes Historical Provider, MD  cholecalciferol (VITAMIN D) 1000 UNITS tablet Take 1,000 Units by mouth daily.   Yes Historical Provider, MD  diclofenac sodium (VOLTAREN) 1 % GEL Apply 4 g topically every evening.    Yes Historical Provider, MD  diclofenac sodium (VOLTAREN) 1 % GEL Apply 4 g topically daily as needed (leg pain).   Yes Historical Provider, MD  diphenhydrAMINE (BENADRYL) 12.5 MG/5ML liquid Take 25 mg by mouth every 6 (six) hours as needed for itching.   Yes Historical Provider, MD  ferrous sulfate 325 (65 FE) MG tablet Take 325 mg by mouth daily with breakfast.   Yes Historical Provider, MD  furosemide (LASIX) 20 MG tablet Take 40 mg by mouth daily.   Yes Historical Provider, MD  hydrocortisone (ANUSOL-HC) 2.5 % rectal cream Place 1 application rectally 2 (two) times daily as needed for hemorrhoids or itching.   Yes Historical Provider, MD  loperamide (IMODIUM) 2 MG capsule Take 4 mg by mouth daily as needed for diarrhea or loose stools.   Yes Historical Provider, MD  LORazepam (ATIVAN) 0.5 MG tablet Take 0.5 mg by mouth 3 (three) times daily.  08/11/13  Yes Historical Provider, MD  metoprolol succinate (TOPROL-XL) 25 MG 24 hr tablet Take 12.5 mg by mouth daily.   08/09/13  Yes Historical Provider, MD  polyethylene glycol (MIRALAX / GLYCOLAX) packet Take 17 g by mouth daily as needed for mild constipation (constipation).   Yes Historical Provider, MD  polyvinyl alcohol (LIQUIFILM TEARS) 1.4 % ophthalmic solution Place 2 drops into both eyes every 2 (two) hours as needed for dry eyes.   Yes Historical Provider, MD  simethicone (MYLICON) 903 MG chewable tablet Chew 125 mg by mouth daily as needed for flatulence.   Yes Historical Provider, MD  Throat Lozenges (LOZENGES MT) Use as directed 1 lozenge in the mouth or throat daily as needed (sore throat).   Yes Historical Provider, MD  triamcinolone cream (KENALOG) 0.1 % Apply 1 application topically 2 (two) times daily.   Yes Historical Provider, MD  vitamin B-12 (CYANOCOBALAMIN) 1000 MCG tablet Take 1,000 mcg by mouth daily.   Yes Historical Provider, MD  vitamin C (ASCORBIC ACID) 500 MG tablet Take 500 mg by mouth daily.   Yes Historical Provider, MD  capsaicin (ZOSTRIX) 0.025 % cream Apply topically 2 (two) times daily. Patient not taking: Reported on 03/28/2014 12/18/12   [name], MD  docusate sodium 100 MG CAPS Take 100 mg by mouth 2 (two) times daily. Patient not taking: Reported on 03/28/2014 12/18/12   [name], MD   BP 157/53 mmHg  Pulse 58  Temp(Src) 98.1 F (36.7 C) (Oral)  Resp 18  SpO2 100% Physical Exam  Constitutional: She appears well-developed and well-nourished. No distress.  HENT:  Head: Normocephalic and atraumatic.  Mouth/Throat: Oropharynx is clear and moist.  Eyes: Conjunctivae are normal. Pupils are equal, round, and reactive to light. No scleral icterus.  Neck: Neck supple.  Cardiovascular: Normal rate, regular rhythm, normal heart sounds and intact distal pulses.   No murmur heard. Pulmonary/Chest: Effort normal and breath sounds normal. No stridor. No respiratory distress. She has no wheezes. She has no rales.  Abdominal: Soft. Bowel sounds are normal. She exhibits  no distension. There is no tenderness. There is no rebound and no guarding.  Musculoskeletal: Normal range of motion. She exhibits edema (1+ BLE with 1+ BLE DP pulses).  Neurological: She is alert.  Hard of hearing  Skin: Skin is warm and dry. No rash noted.  Psychiatric: She has a normal mood and affect. Her behavior is normal.  Nursing note and vitals reviewed.   ED Course  Procedures (including critical care time) Labs Review Labs Reviewed  CBC - Abnormal; Notable for the following:    Hemoglobin 11.5 (*)    HCT 34.9 (*)    MCV 71.5 (*)    MCH 23.6 (*)    All other components within normal limits  I-STAT CHEM 8, ED - Abnormal; Notable for the following:  Sodium 133 (*)    Chloride 94 (*)    Glucose, Bld 116 (*)    Calcium, Ion 1.11 (*)    All other components within normal limits  URINALYSIS, ROUTINE W REFLEX MICROSCOPIC  BRAIN NATRIURETIC PEPTIDE    Imaging Review Dg Chest 2 View  03/28/2014   CLINICAL DATA:  Initial evaluation for bilateral leg swelling and pain. History of hypertension, diabetes.  EXAM: CHEST  2 VIEW  COMPARISON:  Prior study from 12/14/2012  FINDINGS: The cardiac and mediastinal silhouettes are stable in size and contour, and remain within normal limits.  The lungs are normally inflated. No airspace consolidation, pleural effusion, or pulmonary edema is identified. There is no pneumothorax.  No acute osseous abnormality identified. Scattered multilevel degenerative changes present within the visualized spine. No acute osseous abnormality.  IMPRESSION: No active cardiopulmonary disease.   Electronically Signed   By: Jeannine Boga M.D.   On: 03/28/2014 22:40  All radiology studies independently viewed by me.      EKG Interpretation None      MDM   Final diagnoses:  Leg swelling    81 yo female with bilateral lower extremity swelling of unknown duration, but to some extent for at least a month.  Daughter reports that her facility recently  stopped using compression stocking because they were too tight.  New, larger compression stockings are apparently on order.  I think this is likely the reason for her increased edema.  Time course and bilateral distribution make DVT unlikely.  Also, unable to obtain DVT studies tonight.  Regardless, empiric anticoagulation is likely more dangerous than beneficial due to her falls.  She also has no pulmonary symptoms and no history of CHF.  She appears to be stable for discharge back to her care facility.   I recommended PCP follow up for further monitoring or testing.      Houston Siren III, MD 03/29/14 [phone]

## 2014-06-23 ENCOUNTER — Emergency Department (HOSPITAL_COMMUNITY): Payer: Medicare Other

## 2014-06-23 ENCOUNTER — Encounter (HOSPITAL_COMMUNITY): Payer: Self-pay

## 2014-06-23 ENCOUNTER — Emergency Department (HOSPITAL_COMMUNITY)
Admission: EM | Admit: 2014-06-23 | Discharge: 2014-06-23 | Disposition: A | Discharge status: Home / Self Care | Payer: Medicare Other | Attending: Emergency Medicine | Admitting: Emergency Medicine

## 2014-06-23 DIAGNOSIS — Y998 Other external cause status: Secondary | ICD-10-CM | POA: Diagnosis not present

## 2014-06-23 DIAGNOSIS — W1839XA Other fall on same level, initial encounter: Secondary | ICD-10-CM | POA: Diagnosis not present

## 2014-06-23 DIAGNOSIS — S79912A Unspecified injury of left hip, initial encounter: Secondary | ICD-10-CM | POA: Diagnosis not present

## 2014-06-23 DIAGNOSIS — Z7982 Long term (current) use of aspirin: Secondary | ICD-10-CM | POA: Diagnosis not present

## 2014-06-23 DIAGNOSIS — M1990 Unspecified osteoarthritis, unspecified site: Secondary | ICD-10-CM | POA: Diagnosis not present

## 2014-06-23 DIAGNOSIS — F0280 Dementia in other diseases classified elsewhere without behavioral disturbance: Secondary | ICD-10-CM | POA: Insufficient documentation

## 2014-06-23 DIAGNOSIS — Z9842 Cataract extraction status, left eye: Secondary | ICD-10-CM | POA: Diagnosis not present

## 2014-06-23 DIAGNOSIS — Z9841 Cataract extraction status, right eye: Secondary | ICD-10-CM | POA: Insufficient documentation

## 2014-06-23 DIAGNOSIS — T148 Other injury of unspecified body region: Secondary | ICD-10-CM | POA: Insufficient documentation

## 2014-06-23 DIAGNOSIS — S59902A Unspecified injury of left elbow, initial encounter: Secondary | ICD-10-CM | POA: Diagnosis present

## 2014-06-23 DIAGNOSIS — Z79899 Other long term (current) drug therapy: Secondary | ICD-10-CM | POA: Diagnosis not present

## 2014-06-23 DIAGNOSIS — Z880 Allergy status to penicillin: Secondary | ICD-10-CM | POA: Diagnosis not present

## 2014-06-23 DIAGNOSIS — Z9181 History of falling: Secondary | ICD-10-CM | POA: Insufficient documentation

## 2014-06-23 DIAGNOSIS — E119 Type 2 diabetes mellitus without complications: Secondary | ICD-10-CM | POA: Insufficient documentation

## 2014-06-23 DIAGNOSIS — Y92009 Unspecified place in unspecified non-institutional (private) residence as the place of occurrence of the external cause: Secondary | ICD-10-CM | POA: Insufficient documentation

## 2014-06-23 DIAGNOSIS — N184 Chronic kidney disease, stage 4 (severe): Secondary | ICD-10-CM | POA: Insufficient documentation

## 2014-06-23 DIAGNOSIS — W19XXXA Unspecified fall, initial encounter: Secondary | ICD-10-CM

## 2014-06-23 DIAGNOSIS — H9190 Unspecified hearing loss, unspecified ear: Secondary | ICD-10-CM | POA: Diagnosis not present

## 2014-06-23 DIAGNOSIS — G309 Alzheimer's disease, unspecified: Secondary | ICD-10-CM | POA: Diagnosis not present

## 2014-06-23 DIAGNOSIS — I129 Hypertensive chronic kidney disease with stage 1 through stage 4 chronic kidney disease, or unspecified chronic kidney disease: Secondary | ICD-10-CM | POA: Diagnosis not present

## 2014-06-23 DIAGNOSIS — T148XXA Other injury of unspecified body region, initial encounter: Secondary | ICD-10-CM

## 2014-06-23 DIAGNOSIS — S4992XA Unspecified injury of left shoulder and upper arm, initial encounter: Secondary | ICD-10-CM | POA: Diagnosis not present

## 2014-06-23 DIAGNOSIS — Y9389 Activity, other specified: Secondary | ICD-10-CM | POA: Insufficient documentation

## 2014-06-23 DIAGNOSIS — D649 Anemia, unspecified: Secondary | ICD-10-CM | POA: Insufficient documentation

## 2014-06-23 DIAGNOSIS — Z8719 Personal history of other diseases of the digestive system: Secondary | ICD-10-CM | POA: Diagnosis not present

## 2014-06-23 MED ORDER — ONDANSETRON 4 MG PO TBDP: 4.0000 mg | ORAL_TABLET | Freq: Three times a day (TID) | ORAL | Status: DC | PRN

## 2014-06-23 MED ORDER — TRAMADOL HCL 50 MG PO TABS
50.0000 mg | ORAL_TABLET | Freq: Once | ORAL | Status: DC
  Filled 2014-06-23: qty 1

## 2014-06-23 MED ORDER — TRAMADOL HCL 50 MG PO TABS: 50.0000 mg | ORAL_TABLET | Freq: Four times a day (QID) | ORAL | Status: DC | PRN

## 2014-06-23 MED ORDER — ACETAMINOPHEN 325 MG PO TABS
650.0000 mg | ORAL_TABLET | Freq: Once | ORAL | Status: AC
  Administered 2014-06-23: 650 mg via ORAL
  Filled 2014-06-23: qty 2

## 2014-06-23 NOTE — ED Notes (Signed)
Pt family upset at d/c regarding home meds.  Pt aware tramadol causes nausea and has it listed on her chart.  Med d/c and changed to tylenol per Dr. Kathrynn Humble.  Pt d/c.  Report called to facility.  Pt family aware of care exchange.

## 2014-06-23 NOTE — ED Notes (Signed)
Per EMS, pt from [facility].  Unwitnessed fall this morning.  [name] Ramos was next to her and upright.  No blood thinners. Daily aspirin. No head trauma noted.  Palpation pain to left hip.  Original complaints to rt hip.  Pt states she fell getting dressed.  Neck stabilized in route.  Vitals: 176/76, hr 70, resp 18, 98% ra, 20g Left hand.  Pt is hard of hearing.

## 2014-06-23 NOTE — Discharge Instructions (Signed)
[name] Ramos had a fall and likely related bone bruise. She has negative hip xrays, negative elbow and shoulder xrays - left side, and negative Ct scan of the head and neck. She is able to bear weight currently.  We want to ensure that she is closely monitored for pain and to ensure there is PT/OT evaluation. If pt is not getting better, she should see a doctor in 2 weeks.  Tramadol is strong pain meds, and has fall risk, so make sure she is careful if she is to walk after taking those meds.   Contusion A contusion is a deep bruise. Contusions are the result of an injury that caused bleeding under the skin. The contusion may turn blue, purple, or yellow. Minor injuries will give you a painless contusion, but more severe contusions may stay painful and swollen for a few weeks.  CAUSES  A contusion is usually caused by a blow, trauma, or direct force to an area of the body. SYMPTOMS   Swelling and redness of the injured area.  Bruising of the injured area.  Tenderness and soreness of the injured area.  Pain. DIAGNOSIS  The diagnosis can be made by taking a history and physical exam. An X-ray, CT scan, or MRI may be needed to determine if there were any associated injuries, such as fractures. TREATMENT  Specific treatment will depend on what area of the body was injured. In general, the best treatment for a contusion is resting, icing, elevating, and applying cold compresses to the injured area. Over-the-counter medicines may also be recommended for pain control. Ask your caregiver what the best treatment is for your contusion. HOME CARE INSTRUCTIONS   Put ice on the injured area.  Put ice in a plastic bag.  Place a towel between your skin and the bag.  Leave the ice on for 15-20 minutes, 3-4 times a day, or as directed by your health care provider.  Only take over-the-counter or prescription medicines for pain, discomfort, or fever as directed by your caregiver. Your caregiver may  recommend avoiding anti-inflammatory medicines (aspirin, ibuprofen, and naproxen) for 48 hours because these medicines may increase bruising.  Rest the injured area.  If possible, elevate the injured area to reduce swelling. SEEK IMMEDIATE MEDICAL CARE IF:   You have increased bruising or swelling.  You have pain that is getting worse.  Your swelling or pain is not relieved with medicines. MAKE SURE YOU:   Understand these instructions.  Will watch your condition.  Will get help right away if you are not doing well or get worse. Document Released: 11/14/2004 Document Revised: 02/09/2013 Document Reviewed: 12/10/2010 [facility] Patient Information 2015 [place], [place]. This information is not intended to replace advice given to you by your health care provider. Make sure you discuss any questions you have with your health care provider.  Fall Prevention and Home Safety Falls cause injuries and can affect all age groups. It is possible to prevent falls.  HOW TO PREVENT FALLS  Wear shoes with rubber soles that do not have an opening for your toes.  Keep the inside and outside of your house well lit.  Use night lights throughout your home.  Remove clutter from floors.  Clean up floor spills.  Remove throw rugs or fasten them to the floor with carpet tape.  Do not place electrical cords across pathways.  Put grab bars by your tub, shower, and toilet. Do not use towel bars as grab bars.  Put handrails on both  sides of the stairway. Fix loose handrails.  Do not climb on stools or stepladders, if possible.  Do not wax your floors.  Repair uneven or unsafe sidewalks, walkways, or stairs.  Keep items you use a lot within reach.  Be aware of pets.  Keep emergency numbers next to the telephone.  Put smoke detectors in your home and near bedrooms. Ask your doctor what other things you can do to prevent falls. Document Released: 12/01/2008 Document Revised: 08/06/2011  Document Reviewed: 05/07/2011 [facility] Patient Information 2015 [place], [place]. This information is not intended to replace advice given to you by your health care provider. Make sure you discuss any questions you have with your health care provider.

## 2014-06-23 NOTE — ED Provider Notes (Signed)
CSN: [id number]     Arrival date & time 06/23/14  0814 History   First MD Initiated Contact with Patient 06/23/14 0820     Chief Complaint  Patient presents with  . Fall  . Hip Pain     (Consider location/radiation/quality/duration/timing/severity/associated sxs/prior Treatment) HPI Comments: LEVEL 5 CAVEAT FOR DEMENTIA 81 year old female patient presents from assisted living via EMS for an unwitnessed fall at 0730. Patient has hx of Alzheimer's, thrombocytopenia, Stage 4 CKD, HTN, anemia, and arthritis. Is taking a daily aspirin, no other anti-platelets or anti-coagulants. Patient remembers falling while getting dressed this morning, but is unable to recall if she hit her head. History is limited d/t Alzheimer's and difficulty hearing. Complains of left elbow pain and b/l hip pain L>R.  Denies difficulty breathing and chest pain.  Yolanda Bonine is present, states patient's mental status is at baseline and that patient uses a walker. States patient's last fall was approximately 1 year ago and she has never smoked nor has a hx of cancer.   Patient is a 81 y.o. female presenting with fall and hip pain. The history is provided by the patient.  Fall  Hip Pain    Past Medical History  Diagnosis Date  . Alzheimer disease   . Hypertension   . Diabetes mellitus without complication   . Arthritis   . Hearing impaired   . Cataracts, bilateral   . Recurrent falls   . Hemorrhoids   . Chronic kidney disease, stage IV (severe) 12/14/2012  . Fecal impaction 12/15/2012  . Protein-calorie malnutrition, severe 12/16/2012  . Underweight 12/17/2012   Past Surgical History  Procedure Laterality Date  . Tonsillectomy    . Appendectomy    . Cataract extraction, bilateral     Family History  Problem Relation Age of Onset  . Colon cancer Neg Hx   . Breast cancer      multiple  . Dementia Sister   . High blood pressure      multiple  . Diabetes      multiple   History  Substance Use Topics  .  Smoking status: Never Smoker   . Smokeless tobacco: Never Used  . Alcohol Use: No   OB History    No data available     Review of Systems  Unable to perform ROS: Dementia      Allergies  Ace inhibitors; Codeine; Penicillins; Sulfa antibiotics; and Tramadol  Home Medications   Prior to Admission medications   Medication Sig Start Date End Date Taking? Authorizing Provider  acetaminophen (TYLENOL) 325 MG tablet Take 650 mg by mouth every 6 (six) hours as needed for moderate pain (pain).   Yes Historical Provider, MD  acetaminophen (TYLENOL) 325 MG tablet Take 650 mg by mouth at bedtime.   Yes Historical Provider, MD  amLODipine-valsartan (EXFORGE) 10-160 MG per tablet Take 1 tablet by mouth daily with breakfast.    Yes Historical Provider, MD  aspirin EC 81 MG tablet Take 162 mg by mouth daily with breakfast.    Yes Historical Provider, MD  capsaicin (ZOSTRIX) 0.025 % cream Apply topically 2 (two) times daily. Patient not taking: Reported on 03/28/2014 12/18/12   [name], MD  cholecalciferol (VITAMIN D) 1000 UNITS tablet Take 1,000 Units by mouth daily with breakfast.    Yes Historical Provider, MD  diclofenac sodium (VOLTAREN) 1 % GEL Apply 4 g topically daily as needed (leg pain).   Yes Historical Provider, MD  diphenhydrAMINE (BENADRYL) 12.5 MG/5ML liquid Take 12.5  mg by mouth every 6 (six) hours as needed for itching.    Yes Historical Provider, MD  docusate sodium 100 MG CAPS Take 100 mg by mouth 2 (two) times daily. Patient not taking: Reported on 03/28/2014 12/18/12   [name], MD  ferrous sulfate 325 (65 FE) MG tablet Take 325 mg by mouth daily with breakfast.   Yes Historical Provider, MD  furosemide (LASIX) 20 MG tablet Take 40 mg by mouth daily.   Yes Historical Provider, MD  hydrocortisone (ANUSOL-HC) 2.5 % rectal cream Place 1 application rectally 2 (two) times daily as needed for hemorrhoids or itching.   Yes Historical Provider, MD  loperamide (IMODIUM) 2  MG capsule Take 2-4 mg by mouth as needed for diarrhea or loose stools.    Yes Historical Provider, MD  LORazepam (ATIVAN) 0.5 MG tablet Take 0.5 mg by mouth 3 (three) times daily.  08/11/13  Yes Historical Provider, MD  metoprolol succinate (TOPROL-XL) 25 MG 24 hr tablet Take 12.5 mg by mouth daily with breakfast.  08/09/13  Yes Historical Provider, MD  ondansetron (ZOFRAN-ODT) 4 MG disintegrating tablet Take 1 tablet (4 mg total) by mouth every 8 (eight) hours as needed for nausea. 06/23/14   Varney Biles, MD  polyethylene glycol (MIRALAX / GLYCOLAX) packet Take 17 g by mouth daily as needed for mild constipation (constipation).   Yes Historical Provider, MD  polyvinyl alcohol (LIQUIFILM TEARS) 1.4 % ophthalmic solution Place 2 drops into both eyes every 2 (two) hours as needed for dry eyes.   Yes Historical Provider, MD  simethicone (MYLICON) 952 MG chewable tablet Chew 125 mg by mouth as needed for flatulence.    Yes Historical Provider, MD  Throat Lozenges (LOZENGES MT) Use as directed 1 lozenge in the mouth or throat daily as needed (sore throat).   Yes Historical Provider, MD  traMADol (ULTRAM) 50 MG tablet Take 1 tablet (50 mg total) by mouth every 6 (six) hours as needed. 06/23/14   Varney Biles, MD  triamcinolone cream (KENALOG) 0.1 % Apply 1 application topically as needed (for rash). Applies to lower legs and arms   Yes Historical Provider, MD  vitamin B-12 (CYANOCOBALAMIN) 1000 MCG tablet Take 1,000 mcg by mouth daily with breakfast.    Yes Historical Provider, MD  vitamin C (ASCORBIC ACID) 500 MG tablet Take 500 mg by mouth daily with breakfast.    Yes Historical Provider, MD   BP 168/90 mmHg  Pulse 70  Temp(Src) 97.4 F (36.3 C) (Oral)  Resp 20  SpO2 100% Physical Exam  Constitutional: She is oriented to person, place, and time. She appears well-developed and well-nourished.  HENT:  Head: Normocephalic and atraumatic.  Eyes: EOM are normal. Pupils are equal, round, and reactive to  light.  Neck: Neck supple.  No midline c-spine tenderness  Cardiovascular: Normal rate and regular rhythm.   No murmur heard. Pulmonary/Chest: Effort normal and breath sounds normal. No respiratory distress. She exhibits no tenderness.  Abdominal: Soft. Bowel sounds are normal. She exhibits no distension. There is no tenderness.  Musculoskeletal:  L elbow, shoulder tenderness. L hip tenderness.  Otherwise Head to toe evaluation shows no hematoma, bleeding of the scalp, no facial abrasions, step offs, crepitus, no tenderness to palpation of the bilateral upper and lower extremities, no gross deformities, no chest tenderness, no pelvic pain.   Neurological: She is alert and oriented to person, place, and time. No cranial nerve deficit.  Skin: Skin is warm and dry. No rash noted.  Nursing note and vitals reviewed.   ED Course  Procedures (including critical care time) Labs Review Labs Reviewed - No data to display  Imaging Review Dg Chest 1 View  06/23/2014   CLINICAL DATA:  Unwitnessed fall this morning, LEFT shoulder pain, cough, congestion, history hypertension, diabetes, Alzheimer's, stage 4 chronic kidney disease  EXAM: CHEST  1 VIEW  COMPARISON:  03/28/2014  FINDINGS: Minimal enlargement of cardiac silhouette.  Mediastinal contours and pulmonary vascularity normal.  Lungs clear.  No infiltrate, pleural effusion or pneumothorax.  Multilevel endplate spur formation thoracic spine.  No acute osseous findings.  Suspected BILATERAL glenohumeral degenerative changes.  IMPRESSION: No acute abnormalities.  Minimal enlargement of cardiac silhouette.   Electronically Signed   By: Lavonia Dana M.D.   On: 06/23/2014 10:12   Dg Elbow Complete Left  06/23/2014   CLINICAL DATA:  Fall.  Hematoma.  Initial evaluation .  EXAM: LEFT ELBOW - COMPLETE 3+ VIEW  COMPARISON:  12/14/2012.  FINDINGS: There is no evidence of fracture, dislocation, or joint effusion. There is no evidence of arthropathy or other  focal bone abnormality. Soft tissues are unremarkable.  IMPRESSION: No acute bony abnormality.   Electronically Signed   By: Marcello Moores  Register   On: 06/23/2014 10:08   Ct Head Wo Contrast  06/23/2014   CLINICAL DATA:  81 year old female with unwitnessed fall this morning. Found down. Initial encounter.  EXAM: CT HEAD WITHOUT CONTRAST  CT CERVICAL SPINE WITHOUT CONTRAST  TECHNIQUE: Multidetector CT imaging of the head and cervical spine was performed following the standard protocol without intravenous contrast. Multiplanar CT image reconstructions of the cervical spine were also generated.  COMPARISON:  Head and cervical spine CT 10/04/2013 and earlier.  FINDINGS: CT HEAD FINDINGS  Stable mild mucosal thickening right maxillary sinus. Other Visualized paranasal sinuses and mastoids are clear. Stable orbits soft tissues. No acute scalp soft tissue finding. Hyperostosis of the calvarium. No skull fracture identified.  Calcified atherosclerosis at the skull base. Stable cerebral volume, normal for age. No ventriculomegaly. No midline shift, mass effect, or evidence of intracranial mass lesion. Stable gray-white matter differentiation, mild to moderate nonspecific white matter changes. No suspicious intracranial vascular hyperdensity. No evidence of cortically based acute infarction identified. No acute intracranial hemorrhage identified.  CT CERVICAL SPINE FINDINGS  Stable vertebral height and alignment with multilevel cervical spondylolisthesis an associated multilevel severe facet arthropathy. Posterior element and/or interbody ankylosis re- identified from C5-C6 to the upper thoracic spine. Visualized skull base is intact. No atlanto-occipital dissociation. Stable posterior element alignment. No acute cervical spine fracture identified. Visible upper thoracic levels appear grossly intact. Negative noncontrast paraspinal soft tissues. Negative lung apices.  IMPRESSION: 1. No acute intracranial abnormality. Stable  mild to moderate for age nonspecific white matter changes. 2. No acute fracture or listhesis identified in the cervical spine. Ligamentous injury is not excluded. 3. Multilevel cervical spine degeneration and lower cervical to upper thoracic ankylosis.   Electronically Signed   By: Genevie Ann M.D.   On: 06/23/2014 09:39   Ct Cervical Spine Wo Contrast  06/23/2014   CLINICAL DATA:  81 year old female with unwitnessed fall this morning. Found down. Initial encounter.  EXAM: CT HEAD WITHOUT CONTRAST  CT CERVICAL SPINE WITHOUT CONTRAST  TECHNIQUE: Multidetector CT imaging of the head and cervical spine was performed following the standard protocol without intravenous contrast. Multiplanar CT image reconstructions of the cervical spine were also generated.  COMPARISON:  Head and cervical spine CT 10/04/2013 and earlier.  FINDINGS: CT  HEAD FINDINGS  Stable mild mucosal thickening right maxillary sinus. Other Visualized paranasal sinuses and mastoids are clear. Stable orbits soft tissues. No acute scalp soft tissue finding. Hyperostosis of the calvarium. No skull fracture identified.  Calcified atherosclerosis at the skull base. Stable cerebral volume, normal for age. No ventriculomegaly. No midline shift, mass effect, or evidence of intracranial mass lesion. Stable gray-white matter differentiation, mild to moderate nonspecific white matter changes. No suspicious intracranial vascular hyperdensity. No evidence of cortically based acute infarction identified. No acute intracranial hemorrhage identified.  CT CERVICAL SPINE FINDINGS  Stable vertebral height and alignment with multilevel cervical spondylolisthesis an associated multilevel severe facet arthropathy. Posterior element and/or interbody ankylosis re- identified from C5-C6 to the upper thoracic spine. Visualized skull base is intact. No atlanto-occipital dissociation. Stable posterior element alignment. No acute cervical spine fracture identified. Visible upper  thoracic levels appear grossly intact. Negative noncontrast paraspinal soft tissues. Negative lung apices.  IMPRESSION: 1. No acute intracranial abnormality. Stable mild to moderate for age nonspecific white matter changes. 2. No acute fracture or listhesis identified in the cervical spine. Ligamentous injury is not excluded. 3. Multilevel cervical spine degeneration and lower cervical to upper thoracic ankylosis.   Electronically Signed   By: Genevie Ann M.D.   On: 06/23/2014 09:39   Dg Shoulder Left  06/23/2014   CLINICAL DATA:  81 year old female with unwitnessed fall. Found down. Left shoulder pain. Initial encounter.  EXAM: LEFT SHOULDER - 2+ VIEW  COMPARISON:  12/14/2012.  FINDINGS: Bone mineralization is within normal limits for age. Left clavicle appears intact. Advanced degenerative changes at the acromioclavicular joint with heterotopic ossification and acromion subchondral cysts. No left scapula fracture identified. No glenohumeral joint dislocation. Proximal left humerus appears intact. Negative visible left ribs and lung parenchyma.  IMPRESSION: No acute fracture or dislocation identified about the left shoulder. Advanced degenerative changes at the left acromioclavicular joint.   Electronically Signed   By: Genevie Ann M.D.   On: 06/23/2014 10:11   Dg Hips Bilat With Pelvis Min 5 Views  06/23/2014   CLINICAL DATA:  Fall.  Initial evaluation.  EXAM: BILATERAL HIP (WITH PELVIS) 5-6 VIEWS  COMPARISON:  10/04/2013.  FINDINGS: Degenerative changes lumbar spine and both hips. No acute bony abnormality identified. No evidence of fracture or dislocation.  IMPRESSION: No acute abnormality. Degenerative changes lumbar spine and both hips.   Electronically Signed   By: Marcello Moores  Register   On: 06/23/2014 10:10     EKG Interpretation None     @11  - pt ambulated post negative imaging. Daughter at bedside, findings discussed.  MDM   Final diagnoses:  Fall at home, initial encounter  Fall  Contusion    DDx  includes: - Mechanical falls - ICH - Fractures - Contusions - Soft tissue injury  Pt comes in with cc of fall. Appropriate imaging ordered, and is neg. Pt has ambulated. Will d.c to assisted living.    Varney Biles, MD 06/23/14 1154

## 2014-06-23 NOTE — ED Notes (Signed)
IV removed from left wrist.  Catheter was intact.  Area is clean, dry and intact.

## 2014-06-23 NOTE — ED Notes (Signed)
[facility] called  

## 2014-06-23 NOTE — ED Notes (Signed)
Pt able to ambulate with walker to doorway in room via 1 assist.

## 2014-06-23 NOTE — ED Notes (Signed)
Bed: [facility]06 Expected date:  Expected time:  Means of arrival:  Comments: EMS- 81yo F, fall, hip pain

## 2014-07-10 ENCOUNTER — Emergency Department (HOSPITAL_COMMUNITY): Payer: Medicare Other

## 2014-07-10 ENCOUNTER — Encounter (HOSPITAL_COMMUNITY): Payer: Self-pay | Admitting: Emergency Medicine

## 2014-07-10 ENCOUNTER — Emergency Department (HOSPITAL_COMMUNITY)
Admission: EM | Admit: 2014-07-10 | Discharge: 2014-07-10 | Disposition: A | Discharge status: Home / Self Care | Payer: Medicare Other | Attending: Emergency Medicine | Admitting: Emergency Medicine

## 2014-07-10 DIAGNOSIS — I129 Hypertensive chronic kidney disease with stage 1 through stage 4 chronic kidney disease, or unspecified chronic kidney disease: Secondary | ICD-10-CM | POA: Diagnosis not present

## 2014-07-10 DIAGNOSIS — M1990 Unspecified osteoarthritis, unspecified site: Secondary | ICD-10-CM | POA: Insufficient documentation

## 2014-07-10 DIAGNOSIS — Z880 Allergy status to penicillin: Secondary | ICD-10-CM | POA: Insufficient documentation

## 2014-07-10 DIAGNOSIS — W1839XA Other fall on same level, initial encounter: Secondary | ICD-10-CM | POA: Diagnosis not present

## 2014-07-10 DIAGNOSIS — Z8719 Personal history of other diseases of the digestive system: Secondary | ICD-10-CM | POA: Insufficient documentation

## 2014-07-10 DIAGNOSIS — Z79899 Other long term (current) drug therapy: Secondary | ICD-10-CM | POA: Diagnosis not present

## 2014-07-10 DIAGNOSIS — Y9389 Activity, other specified: Secondary | ICD-10-CM | POA: Diagnosis not present

## 2014-07-10 DIAGNOSIS — Y998 Other external cause status: Secondary | ICD-10-CM | POA: Diagnosis not present

## 2014-07-10 DIAGNOSIS — Y92129 Unspecified place in nursing home as the place of occurrence of the external cause: Secondary | ICD-10-CM | POA: Insufficient documentation

## 2014-07-10 DIAGNOSIS — Z7982 Long term (current) use of aspirin: Secondary | ICD-10-CM | POA: Diagnosis not present

## 2014-07-10 DIAGNOSIS — S79912A Unspecified injury of left hip, initial encounter: Secondary | ICD-10-CM | POA: Diagnosis not present

## 2014-07-10 DIAGNOSIS — S0990XA Unspecified injury of head, initial encounter: Secondary | ICD-10-CM | POA: Diagnosis not present

## 2014-07-10 DIAGNOSIS — Z791 Long term (current) use of non-steroidal anti-inflammatories (NSAID): Secondary | ICD-10-CM | POA: Insufficient documentation

## 2014-07-10 DIAGNOSIS — F0280 Dementia in other diseases classified elsewhere without behavioral disturbance: Secondary | ICD-10-CM | POA: Diagnosis not present

## 2014-07-10 DIAGNOSIS — E119 Type 2 diabetes mellitus without complications: Secondary | ICD-10-CM | POA: Insufficient documentation

## 2014-07-10 DIAGNOSIS — G309 Alzheimer's disease, unspecified: Secondary | ICD-10-CM | POA: Diagnosis not present

## 2014-07-10 DIAGNOSIS — W19XXXA Unspecified fall, initial encounter: Secondary | ICD-10-CM

## 2014-07-10 DIAGNOSIS — N184 Chronic kidney disease, stage 4 (severe): Secondary | ICD-10-CM | POA: Diagnosis not present

## 2014-07-10 LAB — BASIC METABOLIC PANEL
Anion gap: 11 (ref 5–15)
BUN: 19 mg/dL (ref 6–20)
CALCIUM: 8.9 mg/dL (ref 8.9–10.3)
CHLORIDE: 94 mmol/L — AB (ref 101–111)
CO2: 25 mmol/L (ref 22–32)
Creatinine, Ser: 0.95 mg/dL (ref 0.44–1.00)
GFR calc Af Amer: 57 mL/min — ABNORMAL LOW (ref >60)
GFR, EST NON AFRICAN AMERICAN: 49 mL/min — AB (ref >60)
Glucose, Bld: 155 mg/dL — ABNORMAL HIGH (ref 65–99)
POTASSIUM: 4.8 mmol/L (ref 3.5–5.1)
Sodium: 130 mmol/L — ABNORMAL LOW (ref 135–145)

## 2014-07-10 LAB — CBC WITH DIFFERENTIAL/PLATELET
BASOS ABS: 0 10*3/uL (ref 0.0–0.1)
Basophils Relative: 0 % (ref 0–1)
EOS PCT: 3 % (ref 0–5)
Eosinophils Absolute: 0.3 10*3/uL (ref 0.0–0.7)
HCT: 33.3 % — ABNORMAL LOW (ref 36.0–46.0)
HEMOGLOBIN: 11 g/dL — AB (ref 12.0–15.0)
LYMPHS ABS: 3 10*3/uL (ref 0.7–4.0)
LYMPHS PCT: 39 % (ref 12–46)
MCH: 23.9 pg — AB (ref 26.0–34.0)
MCHC: 33 g/dL (ref 30.0–36.0)
MCV: 72.2 fL — ABNORMAL LOW (ref 78.0–100.0)
Monocytes Absolute: 0.9 10*3/uL (ref 0.1–1.0)
Monocytes Relative: 11 % (ref 3–12)
NEUTROS ABS: 3.5 10*3/uL (ref 1.7–7.7)
NEUTROS PCT: 47 % (ref 43–77)
Platelets: 296 10*3/uL (ref 150–400)
RBC: 4.61 MIL/uL (ref 3.87–5.11)
RDW: 15.3 % (ref 11.5–15.5)
WBC: 7.7 10*3/uL (ref 4.0–10.5)

## 2014-07-10 MED ORDER — ACETAMINOPHEN 325 MG PO TABS
650.0000 mg | ORAL_TABLET | Freq: Once | ORAL | Status: AC
  Administered 2014-07-10: 650 mg via ORAL
  Filled 2014-07-10: qty 2

## 2014-07-10 NOTE — ED Notes (Addendum)
Patient ambulates with walker. No walker transported with patient.  Will inform EDP.

## 2014-07-10 NOTE — ED Notes (Signed)
Bed: [facility] Expected date:  Expected time:  Means of arrival:  Comments: Room 9

## 2014-07-10 NOTE — Discharge Instructions (Signed)

## 2014-07-10 NOTE — ED Provider Notes (Signed)
CSN: [id number]     Arrival date & time 07/10/14  1140 History   First MD Initiated Contact with Patient 07/10/14 1143     Chief Complaint  Patient presents with  . Fall     (Consider location/radiation/quality/duration/timing/severity/associated sxs/prior Treatment) HPI Comments: Pt is a 81 y.o. female with Pmhx as above who presents after fall at nursing home this morning while using a walker. No reported LOC, pt given fentanyl fo rpain en route and now complains of abdominal pain ,nausea vomiting. Hx limited by dementia and narcotic administration. Pt can follow commands but cannot tell me what hurts.  Patient is a 81 y.o. female presenting with fall. The history is provided by the patient and the EMS personnel.  Fall Pertinent negatives include no chest pain, no abdominal pain, no headaches and no shortness of breath.    Past Medical History  Diagnosis Date  . Alzheimer disease   . Hypertension   . Diabetes mellitus without complication   . Arthritis   . Hearing impaired   . Cataracts, bilateral   . Recurrent falls   . Hemorrhoids   . Chronic kidney disease, stage IV (severe) 12/14/2012  . Fecal impaction 12/15/2012  . Protein-calorie malnutrition, severe 12/16/2012  . Underweight 12/17/2012   Past Surgical History  Procedure Laterality Date  . Tonsillectomy    . Appendectomy    . Cataract extraction, bilateral     Family History  Problem Relation Age of Onset  . Colon cancer Neg Hx   . Breast cancer      multiple  . Dementia Sister   . High blood pressure      multiple  . Diabetes      multiple   History  Substance Use Topics  . Smoking status: Never Smoker   . Smokeless tobacco: Never Used  . Alcohol Use: No   OB History    No data available     Review of Systems  Constitutional: Negative for fever, chills, diaphoresis, activity change, appetite change and fatigue.  HENT: Negative for congestion, facial swelling, rhinorrhea and sore throat.   Eyes:  Negative for photophobia and discharge.  Respiratory: Negative for cough, chest tightness and shortness of breath.   Cardiovascular: Negative for chest pain, palpitations and leg swelling.  Gastrointestinal: Negative for nausea, vomiting, abdominal pain and diarrhea.  Endocrine: Negative for polydipsia and polyuria.  Genitourinary: Negative for dysuria, frequency, difficulty urinating and pelvic pain.  Musculoskeletal: Negative for back pain, arthralgias, neck pain and neck stiffness.  Skin: Negative for color change and wound.  Allergic/Immunologic: Negative for immunocompromised state.  Neurological: Negative for facial asymmetry, weakness, numbness and headaches.  Hematological: Does not bruise/bleed easily.  Psychiatric/Behavioral: Negative for confusion and agitation.      Allergies  Ace inhibitors; Codeine; Penicillins; Sulfa antibiotics; and Tramadol  Home Medications   Prior to Admission medications   Medication Sig Start Date End Date Taking? Authorizing Provider  acetaminophen (TYLENOL) 325 MG tablet Take 650 mg by mouth every 6 (six) hours as needed for moderate pain.    Yes Historical Provider, MD  acetaminophen (TYLENOL) 325 MG tablet Take 650 mg by mouth at bedtime.   Yes Historical Provider, MD  amLODipine-valsartan (EXFORGE) 10-160 MG per tablet Take 1 tablet by mouth daily with breakfast.    Yes Historical Provider, MD  aspirin EC 81 MG tablet Take 162 mg by mouth daily with breakfast.    Yes Historical Provider, MD  cholecalciferol (VITAMIN D) 1000 UNITS tablet Take  1,000 Units by mouth daily with breakfast.    Yes Historical Provider, MD  diclofenac sodium (VOLTAREN) 1 % GEL Apply 4 g topically daily as needed (leg pain).   Yes Historical Provider, MD  diphenhydrAMINE (BENADRYL) 12.5 MG/5ML liquid Take 12.5 mg by mouth every 6 (six) hours as needed for itching.    Yes Historical Provider, MD  ferrous sulfate 325 (65 FE) MG tablet Take 325 mg by mouth daily with  breakfast.   Yes Historical Provider, MD  furosemide (LASIX) 20 MG tablet Take 10 mg by mouth daily.    Yes Historical Provider, MD  hydrocortisone (ANUSOL-HC) 2.5 % rectal cream Place 1 application rectally 2 (two) times daily as needed for hemorrhoids or itching.   Yes Historical Provider, MD  loperamide (IMODIUM) 2 MG capsule Take 2-4 mg by mouth as needed for diarrhea or loose stools.    Yes Historical Provider, MD  LORazepam (ATIVAN) 0.5 MG tablet Take 0.5 mg by mouth 3 (three) times daily.  08/11/13  Yes Historical Provider, MD  metoprolol succinate (TOPROL-XL) 25 MG 24 hr tablet Take 12.5 mg by mouth daily with breakfast.  08/09/13  Yes Historical Provider, MD  polyethylene glycol (MIRALAX / GLYCOLAX) packet Take 17 g by mouth daily as needed for mild constipation.    Yes Historical Provider, MD  polyvinyl alcohol (LIQUIFILM TEARS) 1.4 % ophthalmic solution Place 2 drops into both eyes every 2 (two) hours as needed for dry eyes.   Yes Historical Provider, MD  simethicone (MYLICON) 790 MG chewable tablet Chew 125 mg by mouth as needed for flatulence.    Yes Historical Provider, MD  Throat Lozenges (LOZENGES MT) Use as directed 1 lozenge in the mouth or throat daily as needed (sore throat).   Yes Historical Provider, MD  triamcinolone cream (KENALOG) 0.1 % Apply 1 application topically as needed (for rash). Applies to lower legs and arms   Yes Historical Provider, MD  vitamin B-12 (CYANOCOBALAMIN) 1000 MCG tablet Take 1,000 mcg by mouth daily with breakfast.    Yes Historical Provider, MD  vitamin C (ASCORBIC ACID) 500 MG tablet Take 500 mg by mouth daily with breakfast.    Yes Historical Provider, MD  capsaicin (ZOSTRIX) 0.025 % cream Apply topically 2 (two) times daily. Patient not taking: Reported on 03/28/2014 12/18/12   [name], MD  docusate sodium 100 MG CAPS Take 100 mg by mouth 2 (two) times daily. Patient not taking: Reported on 03/28/2014 12/18/12   [name], MD   ondansetron (ZOFRAN-ODT) 4 MG disintegrating tablet Take 1 tablet (4 mg total) by mouth every 8 (eight) hours as needed for nausea. Patient not taking: Reported on 07/10/2014 06/23/14   Varney Biles, MD  traMADol (ULTRAM) 50 MG tablet Take 1 tablet (50 mg total) by mouth every 6 (six) hours as needed. Patient not taking: Reported on 07/10/2014 06/23/14   Varney Biles, MD   BP 174/63 mmHg  Pulse 81  Resp 18  SpO2 99% Physical Exam  Neurological: GCS eye subscore is 4. GCS verbal subscore is 4. GCS motor subscore is 6.  Winces with ROM of L hip. Dec strength throughout    ED Course  Procedures (including critical care time) Labs Review Labs Reviewed  CBC WITH DIFFERENTIAL/PLATELET - Abnormal; Notable for the following:    Hemoglobin 11.0 (*)    HCT 33.3 (*)    MCV 72.2 (*)    MCH 23.9 (*)    All other components within normal limits  BASIC METABOLIC PANEL - Abnormal; Notable for the following:    Sodium 130 (*)    Chloride 94 (*)    Glucose, Bld 155 (*)    GFR calc non Af Amer 49 (*)    GFR calc Af Amer 57 (*)    All other components within normal limits    Imaging Review Dg Chest 2 View  07/10/2014   CLINICAL DATA:  Fall  EXAM: CHEST  2 VIEW  COMPARISON:  06/23/2014  FINDINGS: Lungs are essentially clear. No focal consolidation. No pleural effusion or pneumothorax.  Cardiomegaly.  Degenerative changes of the thoracic spine.  IMPRESSION: No evidence of acute cardiopulmonary disease.   Electronically Signed   By: Julian Hy M.D.   On: 07/10/2014 13:04   Dg Pelvis 1-2 Views  07/10/2014   CLINICAL DATA:  Fall, right buttock pain  EXAM: PELVIS - 1-2 VIEW  COMPARISON:  None.  FINDINGS: No fracture or dislocation is seen.  Mild degenerative changes of the bilateral hips.  Visualized bony pelvis appears intact.  Degenerative changes of the lower lumbar spine.  IMPRESSION: No fracture or dislocation is seen.  Mild degenerative changes of the bilateral hips and lower lumbar spine.    Electronically Signed   By: Julian Hy M.D.   On: 07/10/2014 13:05   Ct Head Wo Contrast  07/10/2014   CLINICAL DATA:  Fall from standing position read. No loss of consciousness.  EXAM: CT HEAD WITHOUT CONTRAST  CT CERVICAL SPINE WITHOUT CONTRAST  TECHNIQUE: Multidetector CT imaging of the head and cervical spine was performed following the standard protocol without intravenous contrast. Multiplanar CT image reconstructions of the cervical spine were also generated.  COMPARISON:  06/23/2014  FINDINGS: CT HEAD FINDINGS  There is atrophy and chronic small vessel disease changes. No acute intracranial abnormality. Specifically, no hemorrhage, hydrocephalus, mass lesion, acute infarction, or significant intracranial injury. No acute calvarial abnormality.  Mild mucosal thickening in the right maxillary sinus. Remainder the paranasal sinuses and mastoid air cells are clear.  CT CERVICAL SPINE FINDINGS  Diffuse degenerative disc disease and facet disease. Slight anterolisthesis of C3 on C4 and C4 on C5 as well as C7 on T1 related to facet disease. Prevertebral soft tissues are normal. No fracture. No epidural or paraspinal hematoma.  IMPRESSION: No acute intracranial abnormality.  Degenerative disc and facet disease throughout the cervical spine. No acute bony abnormality.   Electronically Signed   By: Rolm Baptise M.D.   On: 07/10/2014 13:02   Ct Cervical Spine Wo Contrast  07/10/2014   CLINICAL DATA:  Fall from standing position read. No loss of consciousness.  EXAM: CT HEAD WITHOUT CONTRAST  CT CERVICAL SPINE WITHOUT CONTRAST  TECHNIQUE: Multidetector CT imaging of the head and cervical spine was performed following the standard protocol without intravenous contrast. Multiplanar CT image reconstructions of the cervical spine were also generated.  COMPARISON:  06/23/2014  FINDINGS: CT HEAD FINDINGS  There is atrophy and chronic small vessel disease changes. No acute intracranial abnormality. Specifically,  no hemorrhage, hydrocephalus, mass lesion, acute infarction, or significant intracranial injury. No acute calvarial abnormality.  Mild mucosal thickening in the right maxillary sinus. Remainder the paranasal sinuses and mastoid air cells are clear.  CT CERVICAL SPINE FINDINGS  Diffuse degenerative disc disease and facet disease. Slight anterolisthesis of C3 on C4 and C4 on C5 as well as C7 on T1 related to facet disease. Prevertebral soft tissues are normal. No fracture. No epidural or paraspinal hematoma.  IMPRESSION:  No acute intracranial abnormality.  Degenerative disc and facet disease throughout the cervical spine. No acute bony abnormality.   Electronically Signed   By: Rolm Baptise M.D.   On: 07/10/2014 13:02     EKG Interpretation   Date/Time:  Sunday Jul 10 2014 12:27:03 EDT Ventricular Rate:  75 PR Interval:  191 QRS Duration: 85 QT Interval:  370 QTC Calculation: 413 R Axis:   69 Text Interpretation:  Sinus rhythm Probable LVH with secondary repol abnrm  No significant change since last tracing Confirmed by [place]  [phone]) on 07/10/2014 12:34:06 PM      MDM   Final diagnoses:  Fall    Pt is a 81 y.o. female with Pmhx as above who presents after fall at nursing home this morning while using a walker. No reported LOC, pt given fentanyl fo rpain en route and now complains of abdominal pain ,nausea vomiting. Hx limited by dementia and narcotic administration. Pt can follow commands but cannot tell me what hurts. She winces with mvmt of L hip. No signs of acute external trauma on PE.   XR hip & lumbar spine negative. Pt feeling improved.  Patient has stood and ambulated with walker.  She is tolerating by mouth, abdominal exam is benign.  She can be discharged back to her assisted living facility  Mariane Baumgarten evaluation in the Emergency Department is complete. It has been determined that no acute conditions requiring further emergency intervention are present at this  time. The patient/guardian have been advised of the diagnosis and plan. We have discussed signs and symptoms that warrant return to the ED, such as changes or worsening in symptoms, worsening pain, numbness, weakness, confusion.       Ernestina Patches, MD 07/10/14 [phone]

## 2014-07-10 NOTE — ED Notes (Signed)
KED removed by EDP and  RN via logroll.

## 2014-07-10 NOTE — ED Notes (Signed)
Bed: WA09 Expected date: 07/10/14 Expected time: 11:39 AM Means of arrival: Ambulance Comments: 81 yo fall, hip pain

## 2014-07-10 NOTE — ED Notes (Addendum)
C/o fall from standing position using walker and reaching for object on floor.  No LOC.  50 mcg Fentanyl enroute for severe right buttocks pain. C-collar applied by EMS, IV saline lock 22 gauge angiocath in right hand.

## 2014-07-10 NOTE — ED Notes (Signed)
EDP notified of patient's arrival.

## 2014-09-11 ENCOUNTER — Emergency Department (HOSPITAL_BASED_OUTPATIENT_CLINIC_OR_DEPARTMENT_OTHER): Payer: Medicare Other

## 2014-09-11 ENCOUNTER — Emergency Department (HOSPITAL_BASED_OUTPATIENT_CLINIC_OR_DEPARTMENT_OTHER)
Admission: EM | Admit: 2014-09-11 | Discharge: 2014-09-11 | Disposition: A | Discharge status: Home / Self Care | Payer: Medicare Other | Attending: Emergency Medicine | Admitting: Emergency Medicine

## 2014-09-11 ENCOUNTER — Encounter (HOSPITAL_BASED_OUTPATIENT_CLINIC_OR_DEPARTMENT_OTHER): Payer: Self-pay | Admitting: Emergency Medicine

## 2014-09-11 DIAGNOSIS — Y998 Other external cause status: Secondary | ICD-10-CM | POA: Diagnosis not present

## 2014-09-11 DIAGNOSIS — S0990XA Unspecified injury of head, initial encounter: Secondary | ICD-10-CM | POA: Insufficient documentation

## 2014-09-11 DIAGNOSIS — Z79899 Other long term (current) drug therapy: Secondary | ICD-10-CM | POA: Diagnosis not present

## 2014-09-11 DIAGNOSIS — N184 Chronic kidney disease, stage 4 (severe): Secondary | ICD-10-CM | POA: Insufficient documentation

## 2014-09-11 DIAGNOSIS — Y9389 Activity, other specified: Secondary | ICD-10-CM | POA: Diagnosis not present

## 2014-09-11 DIAGNOSIS — G309 Alzheimer's disease, unspecified: Secondary | ICD-10-CM | POA: Insufficient documentation

## 2014-09-11 DIAGNOSIS — S79912A Unspecified injury of left hip, initial encounter: Secondary | ICD-10-CM | POA: Insufficient documentation

## 2014-09-11 DIAGNOSIS — Y92122 Bedroom in nursing home as the place of occurrence of the external cause: Secondary | ICD-10-CM | POA: Diagnosis not present

## 2014-09-11 DIAGNOSIS — W01198A Fall on same level from slipping, tripping and stumbling with subsequent striking against other object, initial encounter: Secondary | ICD-10-CM | POA: Diagnosis not present

## 2014-09-11 DIAGNOSIS — Z880 Allergy status to penicillin: Secondary | ICD-10-CM | POA: Diagnosis not present

## 2014-09-11 DIAGNOSIS — Z9841 Cataract extraction status, right eye: Secondary | ICD-10-CM | POA: Diagnosis not present

## 2014-09-11 DIAGNOSIS — Z9842 Cataract extraction status, left eye: Secondary | ICD-10-CM | POA: Diagnosis not present

## 2014-09-11 DIAGNOSIS — M1990 Unspecified osteoarthritis, unspecified site: Secondary | ICD-10-CM | POA: Diagnosis not present

## 2014-09-11 DIAGNOSIS — W19XXXA Unspecified fall, initial encounter: Secondary | ICD-10-CM

## 2014-09-11 DIAGNOSIS — I129 Hypertensive chronic kidney disease with stage 1 through stage 4 chronic kidney disease, or unspecified chronic kidney disease: Secondary | ICD-10-CM | POA: Diagnosis not present

## 2014-09-11 DIAGNOSIS — F0280 Dementia in other diseases classified elsewhere without behavioral disturbance: Secondary | ICD-10-CM | POA: Diagnosis not present

## 2014-09-11 DIAGNOSIS — M25551 Pain in right hip: Secondary | ICD-10-CM

## 2014-09-11 DIAGNOSIS — E119 Type 2 diabetes mellitus without complications: Secondary | ICD-10-CM | POA: Diagnosis not present

## 2014-09-11 DIAGNOSIS — Z8719 Personal history of other diseases of the digestive system: Secondary | ICD-10-CM | POA: Diagnosis not present

## 2014-09-11 DIAGNOSIS — S79911A Unspecified injury of right hip, initial encounter: Secondary | ICD-10-CM | POA: Diagnosis not present

## 2014-09-11 DIAGNOSIS — M25552 Pain in left hip: Secondary | ICD-10-CM

## 2014-09-11 LAB — CBG MONITORING, ED: Glucose-Capillary: 118 mg/dL — ABNORMAL HIGH (ref 65–99)

## 2014-09-11 NOTE — Discharge Instructions (Signed)
Return to the ED with any concerns including vomiting, seizure activity, worsening pain, weakness of legs, not able to urinate, decreased level of alertness/lethargy, or any other alarming symptoms

## 2014-09-11 NOTE — ED Notes (Signed)
I assisted patient onto bedpan, then helped her change gown, made comfortable. Urine output was approximately 400 ml's.

## 2014-09-11 NOTE — ED Notes (Signed)
I took CBG and got result of 118 mg. / dcltr.

## 2014-09-11 NOTE — ED Notes (Signed)
Patient resting, NAD, no complaints except generalized pain 4/10 but states "its not too bad". Patient alert and interactive. Attempted orientation questions to assess orientation but patient unable to hear well.

## 2014-09-11 NOTE — ED Notes (Signed)
Pt back from b/r via 'steady'.  At this time pt resting, NAD, calm, eyes closed, Dr. Canary Brim into room.

## 2014-09-11 NOTE — ED Notes (Signed)
Pt assisted to bathroom with other RN, steady required for mobilizing pt, pt denied any pain with movement to and from bathroom.

## 2014-09-11 NOTE — ED Provider Notes (Signed)
CSN: [id number]     Arrival date & time 09/11/14  1733 History   This chart was scribed for Alfonzo Beers, MD by Chester Holstein, ED Scribe. This patient was seen in room MH05/MH05 and the patient's care was started at 7:50 PM.     Chief Complaint  Patient presents with  . Fall   LEVEL 5 CAVEAT- DEMENTIA  Patient is a 81 y.o. female presenting with fall. The history is provided by the EMS personnel, medical records and the patient. No language interpreter was used.  Fall This is a recurrent problem. The problem has not changed since onset.  HPI Comments: [name] Ramos is a 81 y.o. female brought in by ambulance, with PMHx of Alzheimer's HTN, DM, CKD, recurrent falls, and arthritiswho presents to the Emergency Department from [facility] complaining of fall. Per nursing note: pt was found by bedside; unsure if fall was witnessed. Per EMS staff states pt hit her head on nightstand. Pt c/o left elbow pain, left lower leg pain, and right hip pain. Pt does not endorse back or neck pain.   Past Medical History  Diagnosis Date  . Alzheimer disease   . Hypertension   . Diabetes mellitus without complication   . Arthritis   . Hearing impaired   . Cataracts, bilateral   . Recurrent falls   . Hemorrhoids   . Chronic kidney disease, stage IV (severe) 12/14/2012  . Fecal impaction 12/15/2012  . Protein-calorie malnutrition, severe 12/16/2012  . Underweight 12/17/2012   Past Surgical History  Procedure Laterality Date  . Tonsillectomy    . Appendectomy    . Cataract extraction, bilateral     Family History  Problem Relation Age of Onset  . Colon cancer Neg Hx   . Breast cancer      multiple  . Dementia Sister   . High blood pressure      multiple  . Diabetes      multiple   History  Substance Use Topics  . Smoking status: Never Smoker   . Smokeless tobacco: Never Used  . Alcohol Use: No   OB History    No data available     Review of Systems  Unable to perform ROS:  Dementia      Allergies  Ace inhibitors; Codeine; Penicillins; Sulfa antibiotics; and Tramadol  Home Medications   Prior to Admission medications   Medication Sig Start Date End Date Taking? Authorizing Provider  acetaminophen (TYLENOL) 325 MG tablet Take 650 mg by mouth every 6 (six) hours as needed for moderate pain.     Historical Provider, MD  acetaminophen (TYLENOL) 325 MG tablet Take 650 mg by mouth at bedtime.    Historical Provider, MD  amLODipine-valsartan (EXFORGE) 10-160 MG per tablet Take 1 tablet by mouth daily with breakfast.     Historical Provider, MD  aspirin EC 81 MG tablet Take 162 mg by mouth daily with breakfast.     Historical Provider, MD  capsaicin (ZOSTRIX) 0.025 % cream Apply topically 2 (two) times daily. Patient not taking: Reported on 03/28/2014 12/18/12   [name], MD  cholecalciferol (VITAMIN D) 1000 UNITS tablet Take 1,000 Units by mouth daily with breakfast.     Historical Provider, MD  diclofenac sodium (VOLTAREN) 1 % GEL Apply 4 g topically daily as needed (leg pain).    Historical Provider, MD  diphenhydrAMINE (BENADRYL) 12.5 MG/5ML liquid Take 12.5 mg by mouth every 6 (six) hours as needed for itching.  Historical Provider, MD  docusate sodium 100 MG CAPS Take 100 mg by mouth 2 (two) times daily. Patient not taking: Reported on 03/28/2014 12/18/12   [name], MD  ferrous sulfate 325 (65 FE) MG tablet Take 325 mg by mouth daily with breakfast.    Historical Provider, MD  furosemide (LASIX) 20 MG tablet Take 10 mg by mouth daily.     Historical Provider, MD  hydrocortisone (ANUSOL-HC) 2.5 % rectal cream Place 1 application rectally 2 (two) times daily as needed for hemorrhoids or itching.    Historical Provider, MD  loperamide (IMODIUM) 2 MG capsule Take 2-4 mg by mouth as needed for diarrhea or loose stools.     Historical Provider, MD  LORazepam (ATIVAN) 0.5 MG tablet Take 0.5 mg by mouth 3 (three) times daily.  08/11/13   Historical  Provider, MD  metoprolol succinate (TOPROL-XL) 25 MG 24 hr tablet Take 12.5 mg by mouth daily with breakfast.  08/09/13   Historical Provider, MD  ondansetron (ZOFRAN-ODT) 4 MG disintegrating tablet Take 1 tablet (4 mg total) by mouth every 8 (eight) hours as needed for nausea. Patient not taking: Reported on 07/10/2014 06/23/14   Varney Biles, MD  polyethylene glycol (MIRALAX / GLYCOLAX) packet Take 17 g by mouth daily as needed for mild constipation.     Historical Provider, MD  polyvinyl alcohol (LIQUIFILM TEARS) 1.4 % ophthalmic solution Place 2 drops into both eyes every 2 (two) hours as needed for dry eyes.    Historical Provider, MD  simethicone (MYLICON) 979 MG chewable tablet Chew 125 mg by mouth as needed for flatulence.     Historical Provider, MD  Throat Lozenges (LOZENGES MT) Use as directed 1 lozenge in the mouth or throat daily as needed (sore throat).    Historical Provider, MD  traMADol (ULTRAM) 50 MG tablet Take 1 tablet (50 mg total) by mouth every 6 (six) hours as needed. Patient not taking: Reported on 07/10/2014 06/23/14   Varney Biles, MD  triamcinolone cream (KENALOG) 0.1 % Apply 1 application topically as needed (for rash). Applies to lower legs and arms    Historical Provider, MD  vitamin B-12 (CYANOCOBALAMIN) 1000 MCG tablet Take 1,000 mcg by mouth daily with breakfast.     Historical Provider, MD  vitamin C (ASCORBIC ACID) 500 MG tablet Take 500 mg by mouth daily with breakfast.     Historical Provider, MD   BP 168/70 mmHg  Pulse 70  Temp(Src) 98.3 F (36.8 C) (Oral)  Resp 20  Wt 100 lb (45.36 kg)  SpO2 99% Physical Exam  Constitutional: She is oriented to person, place, and time. She appears well-developed and well-nourished.  HENT:  Head: Normocephalic.  Eyes: Conjunctivae are normal.  Neck: Normal range of motion. Neck supple.  Pulmonary/Chest: Effort normal.  Musculoskeletal: Normal range of motion.  Neurological: She is alert and oriented to person, place,  and time.  Skin: Skin is warm and dry.  Psychiatric: She has a normal mood and affect. Her behavior is normal.  Nursing note and vitals reviewed. Gen awake, alert, not oriented.  ttp over bilateral hips , pain with ROM.  , head NCAT, neck- no midline tenderness, back- no midline tenderness of back, no CVa tenderness, neuro- alert, moving all extremities, normal speech.  Lungs CTAB, CV RRR no murmur  ED Course  Procedures (including critical care time) DIAGNOSTIC STUDIES: Oxygen Saturation is 100% on room air, normal by my interpretation.    COORDINATION OF CARE: 7:55 PM Discussed  treatment plan with patient at beside, the patient agrees with the plan and has no further questions at this time.   Labs Review Labs Reviewed  CBG MONITORING, ED - Abnormal; Notable for the following:    Glucose-Capillary 118 (*)    All other components within normal limits    Imaging Review Ct Head Wo Contrast  09/11/2014   CLINICAL DATA:  Pain following fall. History of Alzheimer's dementia  EXAM: CT HEAD WITHOUT CONTRAST  TECHNIQUE: Contiguous axial images were obtained from the base of the skull through the vertex without intravenous contrast.  COMPARISON:  Jul 10, 2014  FINDINGS: There is moderate diffuse atrophy. Atrophy is slightly greater in the frontal regions than elsewhere, stable. There is no intracranial mass, hemorrhage, extra-axial fluid collection, or midline shift. There is patchy small vessel disease in the centra semiovale bilaterally, stable. There is no new gray-white compartment lesion. No acute infarct evident. The bony calvarium appears intact. Mastoid air cells are hypoplastic on the left but clear. Mastoids on the right are clear.  IMPRESSION: Stable atrophy with periventricular small vessel disease. No intracranial mass, hemorrhage, or extra-axial fluid collection. No acute infarct evident.   Electronically Signed   By: Lowella Grip III M.D.   On: 09/11/2014 20:15   Dg Hips Bilat  With Pelvis 3-4 Views  09/11/2014   CLINICAL DATA:  Status post fall.  Left hip pain.  EXAM: DG HIP (WITH OR WITHOUT PELVIS) 3-4V BILAT  COMPARISON:  06/23/2014  FINDINGS: There is no evidence of hip fracture or dislocation. There is severe osteopenia. There are calcifications adjacent to the right greater trochanter which may reflect calcific tendinosis versus calcific bursitis. There is no lytic or sclerotic osseous lesion. There is peripheral vascular atherosclerotic disease.  IMPRESSION: No acute osseous injury of the hips. Given the patient's age and osteopenia, if there is persistent clinical concern for an occult hip fracture, a MRI of the hip is recommended for increased sensitivity.   Electronically Signed   By: Kathreen Devoid   On: 09/11/2014 20:31     EKG Interpretation None      MDM   Final diagnoses:  Fall  Hip pain, bilateral  Head injury, initial encounter   Pt with dementia presents after hitting her head- on exam patient seems to have tenderness to palpation over both hips, xrays reassuring.  heasd CT reassuring as well. Pt cleared for discharge back to nursing facility.  Discharged with strict return precautions.  Pt agreeable with plan.  I personally performed the services described in this documentation, which was scribed in my presence. The recorded information has been reviewed and is accurate.     Alfonzo Beers, MD 09/13/14 1044

## 2014-09-11 NOTE — ED Notes (Signed)
Pt resting, arousable to voice and spontaneous. Pt is HOH. Updated pt with results and plan. Report given to [facility] CMA at [facility] and daughter (Dr. Hilda Blades). Pending [facility] transport.

## 2014-09-11 NOTE — ED Notes (Addendum)
Patient fell at [facility].  Staff found her beside bed.  Unsure if witnessed or not.  EMS states staff at assisted living reports she hit her head on nightstand.  Patient reports left flank to left hip pain. Patient denies head/neck pain per EMS

## 2014-09-11 NOTE — ED Notes (Signed)
Pt placed in room 5, High Fall Risk plan in place with fall risk bracelet applied, room near nsg station, rails x 2 up, stretcher in lowest position and brakes ensured in lock position. All ED staff aware of pts high fall risk classification, door to room left open for constant observation. [facility] within reach, pt instructed not to get off stretcher w/o assistance from medical staff.

## 2014-09-26 ENCOUNTER — Ambulatory Visit: Payer: Medicare Other | Admitting: Physician Assistant

## 2014-10-29 ENCOUNTER — Emergency Department (HOSPITAL_BASED_OUTPATIENT_CLINIC_OR_DEPARTMENT_OTHER): Payer: Medicare Other

## 2014-10-29 ENCOUNTER — Encounter (HOSPITAL_BASED_OUTPATIENT_CLINIC_OR_DEPARTMENT_OTHER): Payer: Self-pay | Admitting: Emergency Medicine

## 2014-10-29 ENCOUNTER — Emergency Department (HOSPITAL_BASED_OUTPATIENT_CLINIC_OR_DEPARTMENT_OTHER)
Admission: EM | Admit: 2014-10-29 | Discharge: 2014-10-29 | Disposition: A | Discharge status: Home / Self Care | Payer: Medicare Other | Attending: Emergency Medicine | Admitting: Emergency Medicine

## 2014-10-29 DIAGNOSIS — N184 Chronic kidney disease, stage 4 (severe): Secondary | ICD-10-CM | POA: Diagnosis not present

## 2014-10-29 DIAGNOSIS — W19XXXA Unspecified fall, initial encounter: Secondary | ICD-10-CM

## 2014-10-29 DIAGNOSIS — Y998 Other external cause status: Secondary | ICD-10-CM | POA: Insufficient documentation

## 2014-10-29 DIAGNOSIS — Z9842 Cataract extraction status, left eye: Secondary | ICD-10-CM | POA: Diagnosis not present

## 2014-10-29 DIAGNOSIS — Z9841 Cataract extraction status, right eye: Secondary | ICD-10-CM | POA: Diagnosis not present

## 2014-10-29 DIAGNOSIS — Y92128 Other place in nursing home as the place of occurrence of the external cause: Secondary | ICD-10-CM | POA: Diagnosis not present

## 2014-10-29 DIAGNOSIS — Z8719 Personal history of other diseases of the digestive system: Secondary | ICD-10-CM | POA: Diagnosis not present

## 2014-10-29 DIAGNOSIS — W1839XA Other fall on same level, initial encounter: Secondary | ICD-10-CM | POA: Insufficient documentation

## 2014-10-29 DIAGNOSIS — Y9389 Activity, other specified: Secondary | ICD-10-CM | POA: Insufficient documentation

## 2014-10-29 DIAGNOSIS — E119 Type 2 diabetes mellitus without complications: Secondary | ICD-10-CM | POA: Diagnosis not present

## 2014-10-29 DIAGNOSIS — I129 Hypertensive chronic kidney disease with stage 1 through stage 4 chronic kidney disease, or unspecified chronic kidney disease: Secondary | ICD-10-CM | POA: Diagnosis not present

## 2014-10-29 DIAGNOSIS — Z880 Allergy status to penicillin: Secondary | ICD-10-CM | POA: Diagnosis not present

## 2014-10-29 DIAGNOSIS — S4992XA Unspecified injury of left shoulder and upper arm, initial encounter: Secondary | ICD-10-CM | POA: Diagnosis not present

## 2014-10-29 DIAGNOSIS — Z79899 Other long term (current) drug therapy: Secondary | ICD-10-CM | POA: Insufficient documentation

## 2014-10-29 DIAGNOSIS — H9190 Unspecified hearing loss, unspecified ear: Secondary | ICD-10-CM | POA: Insufficient documentation

## 2014-10-29 DIAGNOSIS — Z7982 Long term (current) use of aspirin: Secondary | ICD-10-CM | POA: Insufficient documentation

## 2014-10-29 DIAGNOSIS — G309 Alzheimer's disease, unspecified: Secondary | ICD-10-CM | POA: Insufficient documentation

## 2014-10-29 LAB — CBG MONITORING, ED: Glucose-Capillary: 129 mg/dL — ABNORMAL HIGH (ref 65–99)

## 2014-10-29 NOTE — ED Notes (Signed)
Per ems: The patient fell onto a carpet onto her left side. The patient denies any pain but grimaces when touched to her left shoulder and arm. The patient is very confused, as per staff this is her norm. Patient is very [facility]

## 2014-10-29 NOTE — ED Notes (Signed)
Called [facility] to transport back to [facility], states when they can get here they will.  Talked to [place] at [facility] and gave report.

## 2014-10-29 NOTE — Discharge Instructions (Signed)

## 2014-10-29 NOTE — ED Notes (Signed)
Crackers and water brought to pt to eat, will ambulate after she eats.

## 2014-10-29 NOTE — ED Notes (Signed)
CBG 129 

## 2014-10-29 NOTE — ED Provider Notes (Signed)
CSN: [id number]     Arrival date & time 10/29/14  1739 History  This chart was scribed for Ezequiel Essex, MD by Meriel Pica, ED Scribe. This patient was seen in room MHT13/MHT13 and the patient's care was started 5:52 PM.   Chief Complaint  Patient presents with  . Shoulder Pain    LEVEL 5 CAVEAT: DEMENTIA   The history is provided by the EMS personnel. The history is limited by the condition of the patient. No language interpreter was used.   HPI Comments: [name] Ramos is a 81 y.o. female, with a significant PMhx, brought in by ambulance, who presents to the Emergency Department complaining of sudden onset left shoulder and left arm pain s/p witnessed fall that occurred PTA. Per EMS the pt fell onto her left side on a carpeted surface at the nursing home where she resides. She ambulates with a walker at baseline which slipped out from in front of her causing the fall. Pt is not complaining of pain currently but c/o of left shoulder and left arm pain to nursing home staff and EMS. History of Alzheimer's Disease and frequent falls. Pt not taking blood thinning medication.   Past Medical History  Diagnosis Date  . Alzheimer disease   . Hypertension   . Diabetes mellitus without complication   . Arthritis   . Hearing impaired   . Cataracts, bilateral   . Recurrent falls   . Hemorrhoids   . Chronic kidney disease, stage IV (severe) 12/14/2012  . Fecal impaction 12/15/2012  . Protein-calorie malnutrition, severe 12/16/2012  . Underweight 12/17/2012   Past Surgical History  Procedure Laterality Date  . Tonsillectomy    . Appendectomy    . Cataract extraction, bilateral     Family History  Problem Relation Age of Onset  . Colon cancer Neg Hx   . Breast cancer      multiple  . Dementia Sister   . High blood pressure      multiple  . Diabetes      multiple   Social History  Substance Use Topics  . Smoking status: Never Smoker   . Smokeless tobacco: Never Used  .  Alcohol Use: No   OB History    No data available     Review of Systems  Unable to perform ROS: Dementia   Allergies  Ace inhibitors; Codeine; Penicillins; Sulfa antibiotics; and Tramadol  Home Medications   Prior to Admission medications   Medication Sig Start Date End Date Taking? Authorizing Provider  acetaminophen (TYLENOL) 325 MG tablet Take 650 mg by mouth every 6 (six) hours as needed for moderate pain.     Historical Provider, MD  acetaminophen (TYLENOL) 325 MG tablet Take 650 mg by mouth at bedtime.    Historical Provider, MD  amLODipine-valsartan (EXFORGE) 10-160 MG per tablet Take 1 tablet by mouth daily with breakfast.     Historical Provider, MD  aspirin EC 81 MG tablet Take 162 mg by mouth daily with breakfast.     Historical Provider, MD  capsaicin (ZOSTRIX) 0.025 % cream Apply topically 2 (two) times daily. Patient not taking: Reported on 03/28/2014 12/18/12   [name], MD  cholecalciferol (VITAMIN D) 1000 UNITS tablet Take 1,000 Units by mouth daily with breakfast.     Historical Provider, MD  diclofenac sodium (VOLTAREN) 1 % GEL Apply 4 g topically daily as needed (leg pain).    Historical Provider, MD  diphenhydrAMINE (BENADRYL) 12.5 MG/5ML liquid Take 12.5 mg  by mouth every 6 (six) hours as needed for itching.     Historical Provider, MD  docusate sodium 100 MG CAPS Take 100 mg by mouth 2 (two) times daily. Patient not taking: Reported on 03/28/2014 12/18/12   [name], MD  ferrous sulfate 325 (65 FE) MG tablet Take 325 mg by mouth daily with breakfast.    Historical Provider, MD  furosemide (LASIX) 20 MG tablet Take 10 mg by mouth daily.     Historical Provider, MD  hydrocortisone (ANUSOL-HC) 2.5 % rectal cream Place 1 application rectally 2 (two) times daily as needed for hemorrhoids or itching.    Historical Provider, MD  loperamide (IMODIUM) 2 MG capsule Take 2-4 mg by mouth as needed for diarrhea or loose stools.     Historical Provider, MD   LORazepam (ATIVAN) 0.5 MG tablet Take 0.5 mg by mouth 3 (three) times daily.  08/11/13   Historical Provider, MD  metoprolol succinate (TOPROL-XL) 25 MG 24 hr tablet Take 12.5 mg by mouth daily with breakfast.  08/09/13   Historical Provider, MD  ondansetron (ZOFRAN-ODT) 4 MG disintegrating tablet Take 1 tablet (4 mg total) by mouth every 8 (eight) hours as needed for nausea. Patient not taking: Reported on 07/10/2014 06/23/14   Varney Biles, MD  polyethylene glycol (MIRALAX / GLYCOLAX) packet Take 17 g by mouth daily as needed for mild constipation.     Historical Provider, MD  polyvinyl alcohol (LIQUIFILM TEARS) 1.4 % ophthalmic solution Place 2 drops into both eyes every 2 (two) hours as needed for dry eyes.    Historical Provider, MD  simethicone (MYLICON) 350 MG chewable tablet Chew 125 mg by mouth as needed for flatulence.     Historical Provider, MD  Throat Lozenges (LOZENGES MT) Use as directed 1 lozenge in the mouth or throat daily as needed (sore throat).    Historical Provider, MD  traMADol (ULTRAM) 50 MG tablet Take 1 tablet (50 mg total) by mouth every 6 (six) hours as needed. Patient not taking: Reported on 07/10/2014 06/23/14   Varney Biles, MD  triamcinolone cream (KENALOG) 0.1 % Apply 1 application topically as needed (for rash). Applies to lower legs and arms    Historical Provider, MD  vitamin B-12 (CYANOCOBALAMIN) 1000 MCG tablet Take 1,000 mcg by mouth daily with breakfast.     Historical Provider, MD  vitamin C (ASCORBIC ACID) 500 MG tablet Take 500 mg by mouth daily with breakfast.     Historical Provider, MD   BP 145/84 mmHg  Pulse 56  Temp(Src) 98.1 F (36.7 C) (Oral)  Resp 18  SpO2 100% Physical Exam  Constitutional: She is oriented to person, place, and time. She appears well-developed and well-nourished. No distress.  HENT:  Head: Normocephalic and atraumatic.  Mouth/Throat: Oropharynx is clear and moist. No oropharyngeal exudate.  Eyes: Conjunctivae and EOM are  normal. Pupils are equal, round, and reactive to light.  Neck: Normal range of motion. Neck supple.  No meningismus.  Cardiovascular: Normal rate, regular rhythm, normal heart sounds and intact distal pulses.   No murmur heard. Pulmonary/Chest: Effort normal and breath sounds normal. No respiratory distress.  Abdominal: Soft. There is no tenderness. There is no rebound and no guarding.  Musculoskeletal: Normal range of motion. She exhibits no edema or tenderness.  No C,T, or L spine tenderness; FROM of bilateral shoulders and hips w/o pain.   Neurological: She is alert and oriented to person, place, and time. No cranial nerve deficit. She exhibits normal  muscle tone. Coordination normal.  CN 2-12 intact, 5/5 strength throughout, moving all extremities.  Skin: Skin is warm.  Psychiatric: She has a normal mood and affect. Her behavior is normal.  Nursing note and vitals reviewed.   ED Course  Procedures  DIAGNOSTIC STUDIES: Oxygen Saturation is 100% on RA, normal by my interpretation.    COORDINATION OF CARE: 5:56 PM Discussed treatment plan which includes to order diagnostic imaging with pt. Pt acknowledges and agrees to plan.   Labs Review Labs Reviewed  CBG MONITORING, ED - Abnormal; Notable for the following:    Glucose-Capillary 129 (*)    All other components within normal limits    Imaging Review Dg Chest 2 View  10/29/2014   CLINICAL DATA:  Fall on carpet.  EXAM: CHEST  2 VIEW  COMPARISON:  07/10/2014  FINDINGS: Mild cardiac enlargement. No pleural effusion or edema. Chronic interstitial coarsening. No airspace consolidation.  IMPRESSION: 1. Cardiac enlargement. 2. No acute findings.   Electronically Signed   By: Kerby Moors M.D.   On: 10/29/2014 19:33   Dg Pelvis 1-2 Views  10/29/2014   CLINICAL DATA:  Acute pelvic pain after fall.  Initial encounter.  EXAM: PELVIS - 1-2 VIEW  COMPARISON:  None.  FINDINGS: There is no evidence of pelvic fracture or diastasis. No pelvic  bone lesions are seen. Hip and sacroiliac joints appear normal.  IMPRESSION: No significant abnormality seen in the pelvis.   Electronically Signed   By: Marijo Conception, M.D.   On: 10/29/2014 19:37   Dg Elbow Complete Right  10/29/2014   CLINICAL DATA:  Fall onto carpet. Right elbow pain. Initial encounter.  EXAM: RIGHT ELBOW - COMPLETE 3+ VIEW  COMPARISON:  None.  FINDINGS: No acute fracture, dislocation, or elbow joint effusion is identified. Enthesophyte is noted at the medial epicondyle. Small well corticated osseous fragment at the lateral epicondyle may be degenerative or related to remote trauma. No lytic or blastic osseous lesion or soft tissue abnormality is seen.  IMPRESSION: No acute osseous abnormality identified.   Electronically Signed   By: Logan Bores M.D.   On: 10/29/2014 19:36   Dg Wrist Complete Right  10/29/2014   CLINICAL DATA:  81 year old female status post fall and right wrist trauma.  EXAM: RIGHT WRIST - COMPLETE 3+ VIEW  COMPARISON:  None.  FINDINGS: The bones are osteopenic. There is osteoarthritic changes of the base of the thumb. No definite acute fracture identified. A 3 mm linear radiopaque density noted along the radial aspect of the distal forearm, likely a focal soft tissue calcification. Clinical correlation is recommended to exclude a foreign object.  IMPRESSION: Osteopenia.  No definite acute fracture or dislocation.   Electronically Signed   By: Anner Crete M.D.   On: 10/29/2014 19:38   Ct Head Wo Contrast  10/29/2014   CLINICAL DATA:  Fell onto carpet.  Confusion.  EXAM: CT HEAD WITHOUT CONTRAST  TECHNIQUE: Contiguous axial images were obtained from the base of the skull through the vertex without intravenous contrast.  COMPARISON:  09/11/2014  FINDINGS: Prominence of the sulci and ventricles identified compatible with brain atrophy. There is low attenuation throughout the subcortical and periventricular white matter compatible with chronic microvascular  disease. No evidence for acute brain infarct, intracranial hemorrhage or mass. The mastoid air cells and paranasal sinuses are clear. The calvarium is intact.  IMPRESSION: 1. No acute intracranial abnormalities. 2. Chronic microvascular disease and brain atrophy.   Electronically Signed   By:  Kerby Moors M.D.   On: 10/29/2014 19:23   Dg Shoulder Left  10/29/2014   CLINICAL DATA:  Acute left shoulder pain after fall today. Initial encounter.  EXAM: LEFT SHOULDER - 2+ VIEW  COMPARISON:  None.  FINDINGS: There is no evidence of fracture or dislocation. Visualized ribs appear normal. Moderate degenerative change of the left acromioclavicular joint is noted. Soft tissues are unremarkable.  IMPRESSION: Moderate degenerative joint disease of the left acromioclavicular joint. No acute abnormality seen.   Electronically Signed   By: Marijo Conception, M.D.   On: 10/29/2014 19:35   I have personally reviewed and evaluated these images and lab results as part of my medical decision-making.   EKG Interpretation None      MDM   Final diagnoses:  Fall, initial encounter   Fall without apparent injury.  Was complaining of L shoulder pain earlier.  No LOC.  No blood thinner use. Imaging negative. Patient ambulatory with assistance which is her baseline. She appears stable to return to facility.   I personally performed the services described in this documentation, which was scribed in my presence. The recorded information has been reviewed and is accurate.    Ezequiel Essex, MD 10/30/14 [phone]

## 2015-03-15 ENCOUNTER — Emergency Department (HOSPITAL_COMMUNITY): Payer: Medicare Other

## 2015-03-15 ENCOUNTER — Encounter (HOSPITAL_COMMUNITY): Payer: Self-pay | Admitting: *Deleted

## 2015-03-15 ENCOUNTER — Emergency Department (HOSPITAL_COMMUNITY)
Admission: EM | Admit: 2015-03-15 | Discharge: 2015-03-15 | Disposition: A | Discharge status: Home / Self Care | Payer: Medicare Other | Attending: Emergency Medicine | Admitting: Emergency Medicine

## 2015-03-15 DIAGNOSIS — W1839XA Other fall on same level, initial encounter: Secondary | ICD-10-CM | POA: Diagnosis not present

## 2015-03-15 DIAGNOSIS — Y998 Other external cause status: Secondary | ICD-10-CM | POA: Diagnosis not present

## 2015-03-15 DIAGNOSIS — G309 Alzheimer's disease, unspecified: Secondary | ICD-10-CM | POA: Diagnosis not present

## 2015-03-15 DIAGNOSIS — I129 Hypertensive chronic kidney disease with stage 1 through stage 4 chronic kidney disease, or unspecified chronic kidney disease: Secondary | ICD-10-CM | POA: Insufficient documentation

## 2015-03-15 DIAGNOSIS — M1990 Unspecified osteoarthritis, unspecified site: Secondary | ICD-10-CM | POA: Insufficient documentation

## 2015-03-15 DIAGNOSIS — H9190 Unspecified hearing loss, unspecified ear: Secondary | ICD-10-CM | POA: Diagnosis not present

## 2015-03-15 DIAGNOSIS — Y9389 Activity, other specified: Secondary | ICD-10-CM | POA: Insufficient documentation

## 2015-03-15 DIAGNOSIS — Z880 Allergy status to penicillin: Secondary | ICD-10-CM | POA: Insufficient documentation

## 2015-03-15 DIAGNOSIS — Y92129 Unspecified place in nursing home as the place of occurrence of the external cause: Secondary | ICD-10-CM | POA: Insufficient documentation

## 2015-03-15 DIAGNOSIS — S79911A Unspecified injury of right hip, initial encounter: Secondary | ICD-10-CM | POA: Diagnosis present

## 2015-03-15 DIAGNOSIS — Z8719 Personal history of other diseases of the digestive system: Secondary | ICD-10-CM | POA: Insufficient documentation

## 2015-03-15 DIAGNOSIS — F0391 Unspecified dementia with behavioral disturbance: Secondary | ICD-10-CM

## 2015-03-15 DIAGNOSIS — Z7982 Long term (current) use of aspirin: Secondary | ICD-10-CM | POA: Insufficient documentation

## 2015-03-15 DIAGNOSIS — Z79899 Other long term (current) drug therapy: Secondary | ICD-10-CM | POA: Insufficient documentation

## 2015-03-15 DIAGNOSIS — N184 Chronic kidney disease, stage 4 (severe): Secondary | ICD-10-CM | POA: Diagnosis not present

## 2015-03-15 DIAGNOSIS — F0281 Dementia in other diseases classified elsewhere with behavioral disturbance: Secondary | ICD-10-CM | POA: Diagnosis not present

## 2015-03-15 DIAGNOSIS — E119 Type 2 diabetes mellitus without complications: Secondary | ICD-10-CM | POA: Insufficient documentation

## 2015-03-15 DIAGNOSIS — W19XXXA Unspecified fall, initial encounter: Secondary | ICD-10-CM

## 2015-03-15 NOTE — ED Notes (Signed)
[name] Ramos w/ [facility] [phone] sent a fax requesting Pt's results.  This Agricultural consultant spoke w/ her and transferred her to the Buyer, retail.

## 2015-03-15 NOTE — ED Provider Notes (Signed)
CSN: [id number]     Arrival date & time 03/15/15  1312 History   First MD Initiated Contact with Patient 03/15/15 1338     Chief Complaint  Patient presents with  . Hip Pain  . Fall      HPI Per EMS pt from [facility] home with c/o unwitnessed fall na subsequent hip pain. Past Medical History  Diagnosis Date  . Alzheimer disease   . Hypertension   . Diabetes mellitus without complication ([place])   . Arthritis   . Hearing impaired   . Cataracts, bilateral   . Recurrent falls   . Hemorrhoids   . Chronic kidney disease, stage IV (severe) 12/14/2012  . Fecal impaction ([place]) 12/15/2012  . Protein-calorie malnutrition, severe ([place]) 12/16/2012  . Underweight 12/17/2012   Past Surgical History  Procedure Laterality Date  . Tonsillectomy    . Appendectomy    . Cataract extraction, bilateral     Family History  Problem Relation Age of Onset  . Colon cancer Neg Hx   . Breast cancer      multiple  . Dementia Sister   . High blood pressure      multiple  . Diabetes      multiple   Social History  Substance Use Topics  . Smoking status: Never Smoker   . Smokeless tobacco: Never Used  . Alcohol Use: No   OB History    No data available     Review of Systems  Unable to perform ROS: Dementia      Allergies  Ace inhibitors; Codeine; Penicillins; Sulfa antibiotics; and Tramadol  Home Medications   Prior to Admission medications   Medication Sig Start Date End Date Taking? Authorizing Provider  acetaminophen (TYLENOL) 325 MG tablet Take 650 mg by mouth every 6 (six) hours as needed for moderate pain.     Historical Provider, MD  acetaminophen (TYLENOL) 325 MG tablet Take 650 mg by mouth at bedtime.    Historical Provider, MD  amLODipine-valsartan (EXFORGE) 10-160 MG per tablet Take 1 tablet by mouth daily with breakfast.     Historical Provider, MD  aspirin EC 81 MG tablet Take 162 mg by mouth daily with breakfast.     Historical Provider, MD    capsaicin (ZOSTRIX) 0.025 % cream Apply topically 2 (two) times daily. Patient not taking: Reported on 03/28/2014 12/18/12   [name], MD  cholecalciferol (VITAMIN D) 1000 UNITS tablet Take 1,000 Units by mouth daily with breakfast.     Historical Provider, MD  diclofenac sodium (VOLTAREN) 1 % GEL Apply 4 g topically daily as needed (leg pain).    Historical Provider, MD  diphenhydrAMINE (BENADRYL) 12.5 MG/5ML liquid Take 12.5 mg by mouth every 6 (six) hours as needed for itching.     Historical Provider, MD  docusate sodium 100 MG CAPS Take 100 mg by mouth 2 (two) times daily. Patient not taking: Reported on 03/28/2014 12/18/12   [name], MD  ferrous sulfate 325 (65 FE) MG tablet Take 325 mg by mouth daily with breakfast.    Historical Provider, MD  furosemide (LASIX) 20 MG tablet Take 10 mg by mouth daily.     Historical Provider, MD  hydrocortisone (ANUSOL-HC) 2.5 % rectal cream Place 1 application rectally 2 (two) times daily as needed for hemorrhoids or itching.    Historical Provider, MD  loperamide (IMODIUM) 2 MG capsule Take 2-4 mg by mouth as needed for diarrhea or loose stools.  Historical Provider, MD  LORazepam (ATIVAN) 0.5 MG tablet Take 0.5 mg by mouth 3 (three) times daily.  08/11/13   Historical Provider, MD  metoprolol succinate (TOPROL-XL) 25 MG 24 hr tablet Take 12.5 mg by mouth daily with breakfast.  08/09/13   Historical Provider, MD  ondansetron (ZOFRAN-ODT) 4 MG disintegrating tablet Take 1 tablet (4 mg total) by mouth every 8 (eight) hours as needed for nausea. Patient not taking: Reported on 07/10/2014 06/23/14   Varney Biles, MD  polyethylene glycol (MIRALAX / GLYCOLAX) packet Take 17 g by mouth daily as needed for mild constipation.     Historical Provider, MD  polyvinyl alcohol (LIQUIFILM TEARS) 1.4 % ophthalmic solution Place 2 drops into both eyes every 2 (two) hours as needed for dry eyes.    Historical Provider, MD  simethicone (MYLICON) 0000000 MG chewable  tablet Chew 125 mg by mouth as needed for flatulence.     Historical Provider, MD  Throat Lozenges (LOZENGES MT) Use as directed 1 lozenge in the mouth or throat daily as needed (sore throat).    Historical Provider, MD  traMADol (ULTRAM) 50 MG tablet Take 1 tablet (50 mg total) by mouth every 6 (six) hours as needed. Patient not taking: Reported on 07/10/2014 06/23/14   Varney Biles, MD  triamcinolone cream (KENALOG) 0.1 % Apply 1 application topically as needed (for rash). Applies to lower legs and arms    Historical Provider, MD  vitamin B-12 (CYANOCOBALAMIN) 1000 MCG tablet Take 1,000 mcg by mouth daily with breakfast.     Historical Provider, MD  vitamin C (ASCORBIC ACID) 500 MG tablet Take 500 mg by mouth daily with breakfast.     Historical Provider, MD   BP 140/52 mmHg  Pulse 62  Temp(Src) 97.5 F (36.4 C) (Oral)  Resp 20  SpO2 99% Physical Exam  Constitutional: She appears well-developed and well-nourished. No distress.  HENT:  Head: Normocephalic and atraumatic.  Eyes: Pupils are equal, round, and reactive to light.  Neck: Normal range of motion.  Cardiovascular: Normal rate and intact distal pulses.   Pulmonary/Chest: No respiratory distress.  Abdominal: Normal appearance. She exhibits no distension.  Musculoskeletal:       Legs: Mild tenderness to palpation of the right hip  Neurological: She is alert. No cranial nerve deficit.  Skin: Skin is warm and dry. No rash noted.  Nursing note and vitals reviewed.   ED Course  Procedures (including critical care time) Labs Review Labs Reviewed - No data to display  Imaging Review Dg Hip Unilat With Pelvis 2-3 Views Right  03/15/2015  CLINICAL DATA:  Unwitnessed fall, right hip pain EXAM: DG HIP (WITH OR WITHOUT PELVIS) 2-3V RIGHT COMPARISON:  None. FINDINGS: There is no evidence of hip fracture or dislocation. There is no lytic or sclerotic osseous lesion. There are mild degenerative changes of bilateral SI joints. IMPRESSION:  No acute osseous injury of the right hip. Electronically Signed   By: Kathreen Devoid   On: 03/15/2015 14:13   I have personally reviewed and evaluated these images and lab results as part of my medical decision-making.    MDM   Final diagnoses:  Fall, initial encounter  Dementia, with behavioral disturbance        Leonard Schwartz, MD 03/15/15 1440

## 2015-03-15 NOTE — Discharge Instructions (Signed)

## 2015-03-15 NOTE — ED Notes (Signed)
Bed: WA24 Expected date:  Expected time:  Means of arrival:  Comments: Ems  

## 2015-03-15 NOTE — ED Notes (Signed)
Per EMS pt from [facility] home with c/o unwitnessed fall na subsequent hip pain.

## 2015-04-10 ENCOUNTER — Encounter (HOSPITAL_BASED_OUTPATIENT_CLINIC_OR_DEPARTMENT_OTHER): Payer: Self-pay

## 2015-04-10 ENCOUNTER — Emergency Department (HOSPITAL_BASED_OUTPATIENT_CLINIC_OR_DEPARTMENT_OTHER): Payer: Medicare Other

## 2015-04-10 ENCOUNTER — Emergency Department (HOSPITAL_BASED_OUTPATIENT_CLINIC_OR_DEPARTMENT_OTHER)
Admission: EM | Admit: 2015-04-10 | Discharge: 2015-04-11 | Disposition: A | Discharge status: Home / Self Care | Payer: Medicare Other | Attending: Emergency Medicine | Admitting: Emergency Medicine

## 2015-04-10 ENCOUNTER — Emergency Department: Payer: Medicare Other

## 2015-04-10 DIAGNOSIS — Z7982 Long term (current) use of aspirin: Secondary | ICD-10-CM | POA: Insufficient documentation

## 2015-04-10 DIAGNOSIS — Z8719 Personal history of other diseases of the digestive system: Secondary | ICD-10-CM | POA: Diagnosis not present

## 2015-04-10 DIAGNOSIS — E119 Type 2 diabetes mellitus without complications: Secondary | ICD-10-CM | POA: Insufficient documentation

## 2015-04-10 DIAGNOSIS — W19XXXA Unspecified fall, initial encounter: Secondary | ICD-10-CM

## 2015-04-10 DIAGNOSIS — W102XXA Fall (on)(from) incline, initial encounter: Secondary | ICD-10-CM

## 2015-04-10 DIAGNOSIS — Z79899 Other long term (current) drug therapy: Secondary | ICD-10-CM | POA: Insufficient documentation

## 2015-04-10 DIAGNOSIS — N184 Chronic kidney disease, stage 4 (severe): Secondary | ICD-10-CM | POA: Diagnosis not present

## 2015-04-10 DIAGNOSIS — Z9181 History of falling: Secondary | ICD-10-CM | POA: Insufficient documentation

## 2015-04-10 DIAGNOSIS — Y998 Other external cause status: Secondary | ICD-10-CM | POA: Diagnosis not present

## 2015-04-10 DIAGNOSIS — M1990 Unspecified osteoarthritis, unspecified site: Secondary | ICD-10-CM | POA: Insufficient documentation

## 2015-04-10 DIAGNOSIS — Y9289 Other specified places as the place of occurrence of the external cause: Secondary | ICD-10-CM | POA: Insufficient documentation

## 2015-04-10 DIAGNOSIS — Z880 Allergy status to penicillin: Secondary | ICD-10-CM | POA: Insufficient documentation

## 2015-04-10 DIAGNOSIS — S79911A Unspecified injury of right hip, initial encounter: Secondary | ICD-10-CM | POA: Diagnosis present

## 2015-04-10 DIAGNOSIS — Y9389 Activity, other specified: Secondary | ICD-10-CM | POA: Diagnosis not present

## 2015-04-10 DIAGNOSIS — W1839XA Other fall on same level, initial encounter: Secondary | ICD-10-CM | POA: Insufficient documentation

## 2015-04-10 DIAGNOSIS — G309 Alzheimer's disease, unspecified: Secondary | ICD-10-CM | POA: Insufficient documentation

## 2015-04-10 DIAGNOSIS — I129 Hypertensive chronic kidney disease with stage 1 through stage 4 chronic kidney disease, or unspecified chronic kidney disease: Secondary | ICD-10-CM | POA: Diagnosis not present

## 2015-04-10 DIAGNOSIS — S4991XA Unspecified injury of right shoulder and upper arm, initial encounter: Secondary | ICD-10-CM | POA: Insufficient documentation

## 2015-04-10 DIAGNOSIS — F0280 Dementia in other diseases classified elsewhere without behavioral disturbance: Secondary | ICD-10-CM | POA: Insufficient documentation

## 2015-04-10 MED ORDER — ACETAMINOPHEN 160 MG/5ML PO SOLN
650.0000 mg | Freq: Once | ORAL | Status: AC
Start: 2015-04-10 — End: 2015-04-10
  Administered 2015-04-10: 650 mg via ORAL
  Filled 2015-04-10: qty 20.3

## 2015-04-10 MED ORDER — ACETAMINOPHEN 500 MG PO TABS
1000.0000 mg | ORAL_TABLET | Freq: Once | ORAL | Status: DC
  Filled 2015-04-10: qty 2

## 2015-04-10 NOTE — ED Notes (Signed)
Patient transported to X-ray 

## 2015-04-10 NOTE — Discharge Instructions (Signed)
°  X-rays of hip/pelvis and shoulder on right side shows no fracture Ct head and cervical spine without acute injury.  No serious injury from fall today. Return without fail for worsening symptoms, including confusion, vomiting and unable to keep down food/fluids, inability to walk or do daily activities, or any other symptoms concerning to you.

## 2015-04-10 NOTE — ED Notes (Signed)
Per EMS pt from [facility] n[facility] center, states unwitnessed fall, pt c/o hip pain, no deformity noted

## 2015-04-10 NOTE — ED Notes (Signed)
Pt returned from xray

## 2015-04-10 NOTE — ED Provider Notes (Signed)
CSN: [id number]     Arrival date & time 04/10/15  2032 History  By signing my name below, I, Eustaquio Maize, attest that this documentation has been prepared under the direction and in the presence of Forde Dandy, MD. Electronically Signed: Eustaquio Maize, ED Scribe. 04/10/2015. 9:02 PM.   Chief Complaint  Patient presents with  . Fall   LEVEL 5 CAVEAT for dementia  The history is provided by the patient. No language interpreter was used.     HPI Comments: [name] Ramos is a 82 y.o. female brought in by ambulance from [facility], with hx alzheimer's disease, HTN, and DM who presents to the Emergency Department complaining of gradual onset, constant, mild, right hip pain s/p unwitnessed ground level fall that occurred earlier today. Pt cannot recall why she fell. When asked if pt hit her head or passed out she states "Nobody made me fall." Pt also complains of right shoulder pain. Denies any other associated symptoms.    Past Medical History  Diagnosis Date  . Alzheimer disease   . Hypertension   . Diabetes mellitus without complication ([place])   . Arthritis   . Hearing impaired   . Cataracts, bilateral   . Recurrent falls   . Hemorrhoids   . Chronic kidney disease, stage IV (severe) 12/14/2012  . Fecal impaction ([place]) 12/15/2012  . Protein-calorie malnutrition, severe ([place]) 12/16/2012  . Underweight 12/17/2012   Past Surgical History  Procedure Laterality Date  . Tonsillectomy    . Appendectomy    . Cataract extraction, bilateral     Family History  Problem Relation Age of Onset  . Colon cancer Neg Hx   . Breast cancer      multiple  . Dementia Sister   . High blood pressure      multiple  . Diabetes      multiple   Social History  Substance Use Topics  . Smoking status: Never Smoker   . Smokeless tobacco: Never Used  . Alcohol Use: No   OB History    No data available     Review of Systems  Unable to perform ROS: Dementia   Allergies  Ace  inhibitors; Codeine; Penicillins; Sulfa antibiotics; and Tramadol  Home Medications   Prior to Admission medications   Medication Sig Start Date End Date Taking? Authorizing Provider  acetaminophen (TYLENOL) 325 MG tablet Take 650 mg by mouth every 6 (six) hours as needed for moderate pain.     Historical Provider, MD  acetaminophen (TYLENOL) 325 MG tablet Take 650 mg by mouth at bedtime.    Historical Provider, MD  amLODipine-valsartan (EXFORGE) 10-160 MG per tablet Take 1 tablet by mouth daily with breakfast.     Historical Provider, MD  aspirin EC 81 MG tablet Take 162 mg by mouth daily with breakfast.     Historical Provider, MD  capsaicin (ZOSTRIX) 0.025 % cream Apply topically 2 (two) times daily. Patient not taking: Reported on 03/28/2014 12/18/12   [name], MD  cholecalciferol (VITAMIN D) 1000 UNITS tablet Take 1,000 Units by mouth daily with breakfast.     Historical Provider, MD  diclofenac sodium (VOLTAREN) 1 % GEL Apply 4 g topically daily as needed (leg pain).    Historical Provider, MD  diphenhydrAMINE (BENADRYL) 12.5 MG/5ML liquid Take 12.5 mg by mouth every 6 (six) hours as needed for itching.     Historical Provider, MD  docusate sodium 100 MG CAPS Take 100 mg by mouth 2 (  two) times daily. Patient not taking: Reported on 03/28/2014 12/18/12   [name], MD  ferrous sulfate 325 (65 FE) MG tablet Take 325 mg by mouth daily with breakfast.    Historical Provider, MD  furosemide (LASIX) 20 MG tablet Take 10 mg by mouth daily.     Historical Provider, MD  hydrocortisone (ANUSOL-HC) 2.5 % rectal cream Place 1 application rectally 2 (two) times daily as needed for hemorrhoids or itching.    Historical Provider, MD  loperamide (IMODIUM) 2 MG capsule Take 2-4 mg by mouth as needed for diarrhea or loose stools.     Historical Provider, MD  LORazepam (ATIVAN) 0.5 MG tablet Take 0.5 mg by mouth 3 (three) times daily.  08/11/13   Historical Provider, MD  metoprolol succinate  (TOPROL-XL) 25 MG 24 hr tablet Take 12.5 mg by mouth daily with breakfast.  08/09/13   Historical Provider, MD  ondansetron (ZOFRAN-ODT) 4 MG disintegrating tablet Take 1 tablet (4 mg total) by mouth every 8 (eight) hours as needed for nausea. Patient not taking: Reported on 07/10/2014 06/23/14   Varney Biles, MD  polyethylene glycol (MIRALAX / GLYCOLAX) packet Take 17 g by mouth daily as needed for mild constipation.     Historical Provider, MD  polyvinyl alcohol (LIQUIFILM TEARS) 1.4 % ophthalmic solution Place 2 drops into both eyes every 2 (two) hours as needed for dry eyes.    Historical Provider, MD  simethicone (MYLICON) 0000000 MG chewable tablet Chew 125 mg by mouth as needed for flatulence.     Historical Provider, MD  Throat Lozenges (LOZENGES MT) Use as directed 1 lozenge in the mouth or throat daily as needed (sore throat).    Historical Provider, MD  traMADol (ULTRAM) 50 MG tablet Take 1 tablet (50 mg total) by mouth every 6 (six) hours as needed. Patient not taking: Reported on 07/10/2014 06/23/14   Varney Biles, MD  triamcinolone cream (KENALOG) 0.1 % Apply 1 application topically as needed (for rash). Applies to lower legs and arms    Historical Provider, MD  vitamin B-12 (CYANOCOBALAMIN) 1000 MCG tablet Take 1,000 mcg by mouth daily with breakfast.     Historical Provider, MD  vitamin C (ASCORBIC ACID) 500 MG tablet Take 500 mg by mouth daily with breakfast.     Historical Provider, MD   BP 125/73 mmHg  Pulse 63  Temp(Src) 98.2 F (36.8 C) (Oral)  Resp 16  SpO2 100%   Physical Exam  Physical Exam  Nursing note and vitals reviewed. Constitutional: Well developed, well nourished, non-toxic, and in no acute distress Head: Normocephalic and atraumatic.  Mouth/Throat: Oropharynx is clear and moist.  Neck: Normal range of motion. Neck supple. No C spine tenderness.  Cardiovascular: Normal rate and regular rhythm.   Pulmonary/Chest: Effort normal and breath sounds normal.   Abdominal: Soft. There is no tenderness. There is no rebound and no guarding.  Musculoskeletal: Normal range of motion. No CTL tenderness. Tenderness to palpation of the right hip and right shoulder without deformity, swelling, or bruising.  Neurological: Alert, no facial droop, fluent speech, moves all extremities symmetrically. Pupils equal and reactive to light.  Skin: Skin is warm and dry.  Psychiatric: Cooperative   ED Course  Procedures (including critical care time)  DIAGNOSTIC STUDIES: Oxygen Saturation is 99% on RA, normal by my interpretation.    COORDINATION OF CARE: 8:58 PM- treatment plan includes CT Head, CT C Spine, DG R Shoulder, DG R Hip.   Labs Review Labs Reviewed -  No data to display  Imaging Review Dg Shoulder Right  04/10/2015  CLINICAL DATA:  Unwitnessed fall tonight, right shoulder pain. EXAM: RIGHT SHOULDER - 2+ VIEW COMPARISON:  Radiograph 10/01/2013 FINDINGS: Superior subluxation of humeral head abutting the undersurface of the acromion, consistent with chronic rotator cuff arthropathy. No acute fracture or dislocation. Well corticated density posterior to the humeral head on the transscapular Y-view, may be an intra-articular body. Acromioclavicular joint is congruent with degenerative change. IMPRESSION: Chronic change without acute fracture or dislocation. Electronically Signed   By: Jeb Levering M.D.   On: 04/10/2015 23:10   Ct Head Wo Contrast  04/10/2015  CLINICAL DATA:  82 year old female post unwitnessed fall. EXAM: CT HEAD WITHOUT CONTRAST CT CERVICAL SPINE WITHOUT CONTRAST TECHNIQUE: Multidetector CT imaging of the head and cervical spine was performed following the standard protocol without intravenous contrast. Multiplanar CT image reconstructions of the cervical spine were also generated. COMPARISON:  Head CT 10/29/2014.  Cervical spine CT 07/10/2014 FINDINGS: CT HEAD FINDINGS Stable atrophy and chronic small vessel ischemia.No intracranial  hemorrhage, mass effect, or midline shift. No hydrocephalus. The basilar cisterns are patent. No evidence of territorial infarct. No intracranial fluid collection. Calvarium is intact. Included paranasal sinuses and mastoid air cells are well aerated. CT CERVICAL SPINE FINDINGS There is no fracture or acute subluxation. The dens is intact. There are no jumped or perched facets. Diffuse degenerative disc disease and facet arthropathy with anterolisthesis of C3 on C4 and C4 on C5, unchanged from prior exam. Stable small lucency in left lamina of C3 from priors. No prevertebral soft tissue edema. IMPRESSION: 1. Stable atrophy and chronic small vessel ischemia. No acute intracranial abnormality. 2. Stable degenerative change in the cervical spine without acute fracture or subluxation. Electronically Signed   By: Jeb Levering M.D.   On: 04/10/2015 22:46   Ct Cervical Spine Wo Contrast  04/10/2015  CLINICAL DATA:  82 year old female post unwitnessed fall. EXAM: CT HEAD WITHOUT CONTRAST CT CERVICAL SPINE WITHOUT CONTRAST TECHNIQUE: Multidetector CT imaging of the head and cervical spine was performed following the standard protocol without intravenous contrast. Multiplanar CT image reconstructions of the cervical spine were also generated. COMPARISON:  Head CT 10/29/2014.  Cervical spine CT 07/10/2014 FINDINGS: CT HEAD FINDINGS Stable atrophy and chronic small vessel ischemia.No intracranial hemorrhage, mass effect, or midline shift. No hydrocephalus. The basilar cisterns are patent. No evidence of territorial infarct. No intracranial fluid collection. Calvarium is intact. Included paranasal sinuses and mastoid air cells are well aerated. CT CERVICAL SPINE FINDINGS There is no fracture or acute subluxation. The dens is intact. There are no jumped or perched facets. Diffuse degenerative disc disease and facet arthropathy with anterolisthesis of C3 on C4 and C4 on C5, unchanged from prior exam. Stable small lucency in  left lamina of C3 from priors. No prevertebral soft tissue edema. IMPRESSION: 1. Stable atrophy and chronic small vessel ischemia. No acute intracranial abnormality. 2. Stable degenerative change in the cervical spine without acute fracture or subluxation. Electronically Signed   By: Jeb Levering M.D.   On: 04/10/2015 22:46   Dg Hip Unilat With Pelvis 2-3 Views Right  04/10/2015  CLINICAL DATA:  Unwitnessed fall at nursing home tonight. Now with right hip pain. EXAM: DG HIP (WITH OR WITHOUT PELVIS) 2-3V RIGHT COMPARISON:  Pelvis and right hip radiographs 03/15/2015 FINDINGS: No evidence of acute fracture of the pelvis or right hip. Femoral head remains located. Pubic symphysis and sacroiliac joints are congruent. Dense vascular calcifications. IMPRESSION: No  evidence of pelvic or right hip fracture. Electronically Signed   By: Jeb Levering M.D.   On: 04/10/2015 23:12   I have personally reviewed and evaluated these images as part of my medical decision-making.   EKG Interpretation None      MDM   Final diagnoses:  Fall, initial encounter   I personally performed the services described in this documentation, which was scribed in my presence. The recorded information has been reviewed and is accurate.  82 year old female with history of Alzheimer's, HTN, and DM who presents after unwitnessed fall. No signs of trauma on exam. With tenderness to palpation of her right buttocks and right hip, but she has no issues with position changes in bed and with movement. Also with reported right shoulder pain, but no evidence of acute injury. Xr of hip and pelvis negative for fracture and xr shoulder negative for injury. Ct head and neck negative for injury. Given tylenol for pain. Appropriate for discharge back to nursing facility. Able to bear weight on hip in ED. Strict return and follow-up instructions reviewed. She expressed understanding of all discharge instructions and felt comfortable with the  plan of care.    Forde Dandy, MD 04/11/15 [phone]

## 2015-04-10 NOTE — ED Notes (Signed)
Pt put on bedside commode, changed brief.

## 2015-04-10 NOTE — ED Notes (Addendum)
Unwitnessed fall at [facility], pt states she fell on her bottom, is tender to right hip.  No bruising, no deformity noted, pt is tender to the touch on hip.

## 2015-04-10 NOTE — ED Notes (Signed)
MD at bedside. 

## 2015-04-11 NOTE — ED Notes (Signed)
Discharge instructions given to [facility] and nurse at [facility].  Pt transported via [facility] with copies of xrays and original paperwork.

## 2015-04-11 NOTE — ED Notes (Signed)
Pt stood with assistance and walker, took a few steps with full assistance.

## 2015-06-19 ENCOUNTER — Emergency Department (HOSPITAL_COMMUNITY)
Admission: EM | Admit: 2015-06-19 | Discharge: 2015-06-19 | Disposition: A | Discharge status: Home / Self Care | Payer: Medicare Other | Attending: Emergency Medicine | Admitting: Emergency Medicine

## 2015-06-19 ENCOUNTER — Encounter (HOSPITAL_COMMUNITY): Payer: Self-pay | Admitting: Emergency Medicine

## 2015-06-19 DIAGNOSIS — Z880 Allergy status to penicillin: Secondary | ICD-10-CM | POA: Insufficient documentation

## 2015-06-19 DIAGNOSIS — Z8639 Personal history of other endocrine, nutritional and metabolic disease: Secondary | ICD-10-CM | POA: Diagnosis not present

## 2015-06-19 DIAGNOSIS — M1990 Unspecified osteoarthritis, unspecified site: Secondary | ICD-10-CM | POA: Insufficient documentation

## 2015-06-19 DIAGNOSIS — R404 Transient alteration of awareness: Secondary | ICD-10-CM

## 2015-06-19 DIAGNOSIS — F0280 Dementia in other diseases classified elsewhere without behavioral disturbance: Secondary | ICD-10-CM | POA: Diagnosis not present

## 2015-06-19 DIAGNOSIS — G309 Alzheimer's disease, unspecified: Secondary | ICD-10-CM | POA: Insufficient documentation

## 2015-06-19 DIAGNOSIS — I129 Hypertensive chronic kidney disease with stage 1 through stage 4 chronic kidney disease, or unspecified chronic kidney disease: Secondary | ICD-10-CM | POA: Insufficient documentation

## 2015-06-19 DIAGNOSIS — H9190 Unspecified hearing loss, unspecified ear: Secondary | ICD-10-CM | POA: Diagnosis not present

## 2015-06-19 DIAGNOSIS — N184 Chronic kidney disease, stage 4 (severe): Secondary | ICD-10-CM | POA: Insufficient documentation

## 2015-06-19 DIAGNOSIS — Z79899 Other long term (current) drug therapy: Secondary | ICD-10-CM | POA: Insufficient documentation

## 2015-06-19 DIAGNOSIS — Z7982 Long term (current) use of aspirin: Secondary | ICD-10-CM | POA: Diagnosis not present

## 2015-06-19 DIAGNOSIS — Z8719 Personal history of other diseases of the digestive system: Secondary | ICD-10-CM | POA: Insufficient documentation

## 2015-06-19 DIAGNOSIS — R4182 Altered mental status, unspecified: Secondary | ICD-10-CM | POA: Diagnosis present

## 2015-06-19 LAB — BASIC METABOLIC PANEL
ANION GAP: 11 (ref 5–15)
BUN: 18 mg/dL (ref 6–20)
CALCIUM: 8.2 mg/dL — AB (ref 8.9–10.3)
CO2: 21 mmol/L — AB (ref 22–32)
CREATININE: 0.76 mg/dL (ref 0.44–1.00)
Chloride: 99 mmol/L — ABNORMAL LOW (ref 101–111)
GFR calc Af Amer: >60 mL/min (ref >60)
GLUCOSE: 89 mg/dL (ref 65–99)
Potassium: 4.3 mmol/L (ref 3.5–5.1)
Sodium: 131 mmol/L — ABNORMAL LOW (ref 135–145)

## 2015-06-19 LAB — CBC WITH DIFFERENTIAL/PLATELET
BASOS ABS: 0 10*3/uL (ref 0.0–0.1)
Basophils Relative: 0 %
EOS PCT: 1 %
Eosinophils Absolute: 0.1 10*3/uL (ref 0.0–0.7)
HEMATOCRIT: 31.5 % — AB (ref 36.0–46.0)
Hemoglobin: 10.4 g/dL — ABNORMAL LOW (ref 12.0–15.0)
LYMPHS ABS: 1 10*3/uL (ref 0.7–4.0)
LYMPHS PCT: 12 %
MCH: 23.4 pg — AB (ref 26.0–34.0)
MCHC: 33 g/dL (ref 30.0–36.0)
MCV: 70.9 fL — AB (ref 78.0–100.0)
MONO ABS: 1.1 10*3/uL — AB (ref 0.1–1.0)
Monocytes Relative: 13 %
NEUTROS ABS: 6.1 10*3/uL (ref 1.7–7.7)
Neutrophils Relative %: 74 %
PLATELETS: 237 10*3/uL (ref 150–400)
RBC: 4.44 MIL/uL (ref 3.87–5.11)
RDW: 15.3 % (ref 11.5–15.5)
WBC: 8.3 10*3/uL (ref 4.0–10.5)

## 2015-06-19 LAB — URINALYSIS, ROUTINE W REFLEX MICROSCOPIC
Bilirubin Urine: NEGATIVE
GLUCOSE, UA: NEGATIVE mg/dL
KETONES UR: NEGATIVE mg/dL
LEUKOCYTES UA: NEGATIVE
Nitrite: NEGATIVE
PH: 7 (ref 5.0–8.0)
PROTEIN: 100 mg/dL — AB
Specific Gravity, Urine: 1.011 (ref 1.005–1.030)

## 2015-06-19 LAB — URINE MICROSCOPIC-ADD ON
RBC / HPF: NONE SEEN RBC/hpf (ref 0–5)
WBC, UA: NONE SEEN WBC/hpf (ref 0–5)

## 2015-06-19 NOTE — ED Provider Notes (Signed)
CSN: [id number]     Arrival date & time 06/19/15  1857 History   First MD Initiated Contact with Patient 06/19/15 1909     Chief Complaint  Patient presents with  . Altered Mental Status     (Consider location/radiation/quality/duration/timing/severity/associated sxs/prior Treatment) Patient is a 82 y.o. female presenting with altered mental status. The history is provided by the EMS personnel and the nursing home.  Altered Mental Status Presenting symptoms: partial responsiveness   Severity:  Mild Most recent episode:  Today Episode history:  Single Timing:  Sporadic Progression:  Resolved Chronicity:  New Context: dementia and nursing home resident     Past Medical History  Diagnosis Date  . Alzheimer disease   . Hypertension   . Diabetes mellitus without complication ([place])   . Arthritis   . Hearing impaired   . Cataracts, bilateral   . Recurrent falls   . Hemorrhoids   . Chronic kidney disease, stage IV (severe) 12/14/2012  . Fecal impaction ([place]) 12/15/2012  . Protein-calorie malnutrition, severe ([place]) 12/16/2012  . Underweight 12/17/2012   Past Surgical History  Procedure Laterality Date  . Tonsillectomy    . Appendectomy    . Cataract extraction, bilateral     Family History  Problem Relation Age of Onset  . Colon cancer Neg Hx   . Breast cancer      multiple  . Dementia Sister   . High blood pressure      multiple  . Diabetes      multiple   Social History  Substance Use Topics  . Smoking status: Never Smoker   . Smokeless tobacco: Never Used  . Alcohol Use: No   OB History    No data available     Review of Systems  Unable to perform ROS: Dementia  Level 5 exception to history: dementia     Allergies  Ace inhibitors; Codeine; Penicillins; Sulfa antibiotics; and Tramadol  Home Medications   Prior to Admission medications   Medication Sig Start Date End Date Taking? Authorizing Provider  acetaminophen (TYLENOL) 325 MG tablet Take 650  mg by mouth every 6 (six) hours as needed for moderate pain.     Historical Provider, MD  acetaminophen (TYLENOL) 325 MG tablet Take 650 mg by mouth at bedtime.    Historical Provider, MD  amLODipine-valsartan (EXFORGE) 10-160 MG per tablet Take 1 tablet by mouth daily with breakfast.     Historical Provider, MD  aspirin EC 81 MG tablet Take 162 mg by mouth daily with breakfast.     Historical Provider, MD  capsaicin (ZOSTRIX) 0.025 % cream Apply topically 2 (two) times daily. Patient not taking: Reported on 03/28/2014 12/18/12   [name], MD  cholecalciferol (VITAMIN D) 1000 UNITS tablet Take 1,000 Units by mouth daily with breakfast.     Historical Provider, MD  diclofenac sodium (VOLTAREN) 1 % GEL Apply 4 g topically daily as needed (leg pain).    Historical Provider, MD  diphenhydrAMINE (BENADRYL) 12.5 MG/5ML liquid Take 12.5 mg by mouth every 6 (six) hours as needed for itching.     Historical Provider, MD  docusate sodium 100 MG CAPS Take 100 mg by mouth 2 (two) times daily. Patient not taking: Reported on 03/28/2014 12/18/12   [name], MD  ferrous sulfate 325 (65 FE) MG tablet Take 325 mg by mouth daily with breakfast.    Historical Provider, MD  furosemide (LASIX) 20 MG tablet Take 10 mg by mouth daily.  Historical Provider, MD  hydrocortisone (ANUSOL-HC) 2.5 % rectal cream Place 1 application rectally 2 (two) times daily as needed for hemorrhoids or itching.    Historical Provider, MD  loperamide (IMODIUM) 2 MG capsule Take 2-4 mg by mouth as needed for diarrhea or loose stools.     Historical Provider, MD  LORazepam (ATIVAN) 0.5 MG tablet Take 0.5 mg by mouth 3 (three) times daily.  08/11/13   Historical Provider, MD  metoprolol succinate (TOPROL-XL) 25 MG 24 hr tablet Take 12.5 mg by mouth daily with breakfast.  08/09/13   Historical Provider, MD  ondansetron (ZOFRAN-ODT) 4 MG disintegrating tablet Take 1 tablet (4 mg total) by mouth every 8 (eight) hours as needed for  nausea. Patient not taking: Reported on 07/10/2014 06/23/14   Varney Biles, MD  polyethylene glycol (MIRALAX / GLYCOLAX) packet Take 17 g by mouth daily as needed for mild constipation.     Historical Provider, MD  polyvinyl alcohol (LIQUIFILM TEARS) 1.4 % ophthalmic solution Place 2 drops into both eyes every 2 (two) hours as needed for dry eyes.    Historical Provider, MD  simethicone (MYLICON) 0000000 MG chewable tablet Chew 125 mg by mouth as needed for flatulence.     Historical Provider, MD  Throat Lozenges (LOZENGES MT) Use as directed 1 lozenge in the mouth or throat daily as needed (sore throat).    Historical Provider, MD  traMADol (ULTRAM) 50 MG tablet Take 1 tablet (50 mg total) by mouth every 6 (six) hours as needed. Patient not taking: Reported on 07/10/2014 06/23/14   Varney Biles, MD  triamcinolone cream (KENALOG) 0.1 % Apply 1 application topically as needed (for rash). Applies to lower legs and arms    Historical Provider, MD  vitamin B-12 (CYANOCOBALAMIN) 1000 MCG tablet Take 1,000 mcg by mouth daily with breakfast.     Historical Provider, MD  vitamin C (ASCORBIC ACID) 500 MG tablet Take 500 mg by mouth daily with breakfast.     Historical Provider, MD   BP 164/56 mmHg  Pulse 67  Temp(Src) 97.9 F (36.6 C) (Oral)  Resp 19  SpO2 99% Physical Exam  Constitutional: She appears well-developed and well-nourished. No distress.  HENT:  Head: Normocephalic and atraumatic.  Eyes: Conjunctivae are normal.  Neck: Neck supple. No tracheal deviation present.  Cardiovascular: Normal rate, regular rhythm and normal heart sounds.   Pulmonary/Chest: Effort normal and breath sounds normal. No respiratory distress. She has no wheezes. She has no rales. She exhibits no tenderness.  Abdominal: Soft. She exhibits no distension. There is no tenderness. There is no rebound and no guarding.  Neurological: She is alert. She has normal strength. She is disoriented. Coordination normal. GCS eye subscore  is 4. GCS verbal subscore is 4. GCS motor subscore is 6.  Skin: Skin is warm and dry.  Psychiatric: She has a normal mood and affect.    ED Course  Procedures (including critical care time) Labs Review Labs Reviewed  CBC WITH DIFFERENTIAL/PLATELET - Abnormal; Notable for the following:    Hemoglobin 10.4 (*)    HCT 31.5 (*)    MCV 70.9 (*)    MCH 23.4 (*)    Monocytes Absolute 1.1 (*)    All other components within normal limits  BASIC METABOLIC PANEL - Abnormal; Notable for the following:    Sodium 131 (*)    Chloride 99 (*)    CO2 21 (*)    Calcium 8.2 (*)    All other components within  normal limits  URINALYSIS, ROUTINE W REFLEX MICROSCOPIC (NOT AT [facility]) - Abnormal; Notable for the following:    Hgb urine dipstick TRACE (*)    Protein, ur 100 (*)    All other components within normal limits  URINE MICROSCOPIC-ADD ON - Abnormal; Notable for the following:    Squamous Epithelial / LPF 0-5 (*)    Bacteria, UA RARE (*)    All other components within normal limits  URINE CULTURE    Imaging Review No results found. I have personally reviewed and evaluated these images and lab results as part of my medical decision-making.   EKG Interpretation   Date/Time:  Monday Jun 19 2015 18:59:59 EDT Ventricular Rate:  67 PR Interval:  187 QRS Duration: 78 QT Interval:  415 QTC Calculation: 438 R Axis:   86 Text Interpretation:  Sinus rhythm Borderline right axis deviation  Probable LVH with secondary repol abnrm No significant change since last  tracing Confirmed by Huntington Leverich MD, Quillian Quince [id number]) on 06/19/2015 8:22:17 PM      MDM   Final diagnoses:  Transient alteration of awareness    82 y.o. female presents with an episode of slumping over at her nursing facility. They were unavailable for further information. She appears at her documented baseline and is interactive but pleasantly disoriented.  EKG interpreted by me without ST or T wave changes concerning for myocardial  ischemia. No delta wave, no prolonged QTc, no brugada to suggest arrhythmogenicity. No signs of UTI or other infection. No significant hematologic or metabolic abnormalities to explain symptoms. No neuro deficits or indication for emergent neuroimaging, Care transferred back to nursing facility. Pt will return with any recurrence of symptoms.     Leo Grosser, MD 06/20/15 [phone]

## 2015-06-19 NOTE — Discharge Instructions (Signed)
Confusion Confusion is the inability to think with your usual speed or clarity. Confusion may come on quickly or slowly over time. How quickly the confusion comes on depends on the cause. Confusion can be due to any number of causes. CAUSES   Concussion, head injury, or head trauma.  Seizures.  Stroke.  Fever.  Brain tumor.  Age related decreased brain function (dementia).  Heightened emotional states like rage or terror.  Mental illness in which the person loses the ability to determine what is real and what is not (hallucinations).  Infections such as a urinary tract infection (UTI).  Toxic effects from alcohol, drugs, or prescription medicines.  Dehydration and an imbalance of salts in the body (electrolytes).  Lack of sleep.  Low blood sugar (diabetes).  Low levels of oxygen from conditions such as chronic lung disorders.  Drug interactions or other medicine side effects.  Nutritional deficiencies, especially niacin, thiamine, vitamin C, or vitamin B.  Sudden drop in body temperature (hypothermia).  Change in routine, such as when traveling or hospitalized. SIGNS AND SYMPTOMS  People often describe their thinking as cloudy or unclear when they are confused. Confusion can also include feeling disoriented. That means you are unaware of where or who you are. You may also not know what the date or time is. If confused, you may also have difficulty paying attention, remembering, and making decisions. Some people also act aggressively when they are confused.  DIAGNOSIS  The medical evaluation of confusion may include:  Blood and urine tests.  X-rays.  Brain and nervous system tests.  Analyzing your brain waves (electroencephalogram or EEG).  Magnetic resonance imaging (MRI) of your head.  Computed tomography (CT) scan of your head.  Mental status tests in which your health care provider may ask many questions. Some of these questions may seem silly or strange,  but they are a very important test to help diagnose and treat confusion. TREATMENT  An admission to the hospital may not be needed, but a person with confusion should not be left alone. Stay with a family member or friend until the confusion clears. Avoid alcohol, pain relievers, or sedative drugs until you have fully recovered. Do not drive until directed by your health care provider. HOME CARE INSTRUCTIONS  What family and friends can do:  To find out if someone is confused, ask the person to state his or her name, age, and the date. If the person is unsure or answers incorrectly, he or she is confused.  Always introduce yourself, no matter how well the person knows you.  Often remind the person of his or her location.  Place a calendar and clock near the confused person.  Help the person with his or her medicines. You may want to use a pill box, an alarm as a reminder, or give the person each dose as prescribed.  Talk about current events and plans for the day.  Try to keep the environment calm, quiet, and peaceful.  Make sure the person keeps follow-up visits with his or her health care provider. PREVENTION  Ways to prevent confusion:  Avoid alcohol.  Eat a balanced diet.  Get enough sleep.  Take medicine only as directed by your health care provider.  Do not become isolated. Spend time with other people and make plans for your days.  Keep careful watch on your blood sugar levels if you are diabetic. SEEK IMMEDIATE MEDICAL CARE IF:   You develop severe headaches, repeated vomiting, seizures, blackouts, or   slurred speech.  There is increasing confusion, weakness, numbness, restlessness, or personality changes.  You develop a loss of balance, have marked dizziness, feel uncoordinated, or fall.  You have delusions, hallucinations, or develop severe anxiety.  Your family members think you need to be rechecked.   This information is not intended to replace advice given  to you by your health care provider. Make sure you discuss any questions you have with your health care provider.   Document Released: 03/14/2004 Document Revised: 02/25/2014 Document Reviewed: 03/12/2013 Elsevier Interactive Patient Education 2016 [employer]  

## 2015-06-19 NOTE — ED Notes (Signed)
Report called to [facility].  PTAR to take patient back to facility.

## 2015-06-19 NOTE — ED Notes (Signed)
Per EMS, pt from [facility] facility for a period of unresponsiveness. Pt was last seen well at 1500. Pt was watching tv in the tv room when the staff went to check on her and she was unresponsive. Pt initially only grimaced to pain. Pts initial HR was 40. Pts HR increased to 70s. Pt slowly became more alert in route and now seems to be at baseline. CBG:92 Pt alert to self at baseline only.

## 2015-06-19 NOTE — ED Notes (Signed)
Multiple attempts to call report back to [facility], unsuccessful at this time.  PTAR called to return patient to [facility].

## 2015-06-21 LAB — URINE CULTURE: Culture: NO GROWTH

## 2015-06-27 ENCOUNTER — Emergency Department (HOSPITAL_BASED_OUTPATIENT_CLINIC_OR_DEPARTMENT_OTHER): Payer: Medicare Other

## 2015-06-27 ENCOUNTER — Encounter (HOSPITAL_BASED_OUTPATIENT_CLINIC_OR_DEPARTMENT_OTHER): Payer: Self-pay | Admitting: Emergency Medicine

## 2015-06-27 ENCOUNTER — Emergency Department (HOSPITAL_BASED_OUTPATIENT_CLINIC_OR_DEPARTMENT_OTHER)
Admission: EM | Admit: 2015-06-27 | Discharge: 2015-06-27 | Disposition: A | Discharge status: Home / Self Care | Payer: Medicare Other | Attending: Emergency Medicine | Admitting: Emergency Medicine

## 2015-06-27 DIAGNOSIS — N184 Chronic kidney disease, stage 4 (severe): Secondary | ICD-10-CM | POA: Insufficient documentation

## 2015-06-27 DIAGNOSIS — G309 Alzheimer's disease, unspecified: Secondary | ICD-10-CM | POA: Insufficient documentation

## 2015-06-27 DIAGNOSIS — I129 Hypertensive chronic kidney disease with stage 1 through stage 4 chronic kidney disease, or unspecified chronic kidney disease: Secondary | ICD-10-CM | POA: Diagnosis not present

## 2015-06-27 DIAGNOSIS — R4182 Altered mental status, unspecified: Secondary | ICD-10-CM | POA: Diagnosis present

## 2015-06-27 DIAGNOSIS — E119 Type 2 diabetes mellitus without complications: Secondary | ICD-10-CM | POA: Diagnosis not present

## 2015-06-27 LAB — BASIC METABOLIC PANEL
Anion gap: 8 (ref 5–15)
BUN: 19 mg/dL (ref 6–20)
CO2: 26 mmol/L (ref 22–32)
Calcium: 8.8 mg/dL — ABNORMAL LOW (ref 8.9–10.3)
Chloride: 95 mmol/L — ABNORMAL LOW (ref 101–111)
Creatinine, Ser: 0.87 mg/dL (ref 0.44–1.00)
GFR calc Af Amer: >60 mL/min (ref >60)
GFR calc non Af Amer: 54 mL/min — ABNORMAL LOW (ref >60)
Glucose, Bld: 83 mg/dL (ref 65–99)
Potassium: 4.6 mmol/L (ref 3.5–5.1)
Sodium: 129 mmol/L — ABNORMAL LOW (ref 135–145)

## 2015-06-27 LAB — CBC WITH DIFFERENTIAL/PLATELET
Basophils Absolute: 0 10*3/uL (ref 0.0–0.1)
Basophils Relative: 1 %
Eosinophils Absolute: 0.4 10*3/uL (ref 0.0–0.7)
Eosinophils Relative: 7 %
HCT: 32.8 % — ABNORMAL LOW (ref 36.0–46.0)
Hemoglobin: 11.2 g/dL — ABNORMAL LOW (ref 12.0–15.0)
Lymphocytes Relative: 31 %
Lymphs Abs: 1.6 10*3/uL (ref 0.7–4.0)
MCH: 24.1 pg — ABNORMAL LOW (ref 26.0–34.0)
MCHC: 34.1 g/dL (ref 30.0–36.0)
MCV: 70.7 fL — ABNORMAL LOW (ref 78.0–100.0)
Monocytes Absolute: 1 10*3/uL (ref 0.1–1.0)
Monocytes Relative: 19 %
Neutro Abs: 2.3 10*3/uL (ref 1.7–7.7)
Neutrophils Relative %: 42 %
Platelets: 284 10*3/uL (ref 150–400)
RBC: 4.64 MIL/uL (ref 3.87–5.11)
RDW: 15.6 % — ABNORMAL HIGH (ref 11.5–15.5)
WBC: 5.3 10*3/uL (ref 4.0–10.5)

## 2015-06-27 LAB — URINALYSIS, ROUTINE W REFLEX MICROSCOPIC
Bilirubin Urine: NEGATIVE
Glucose, UA: NEGATIVE mg/dL
Hgb urine dipstick: NEGATIVE
Ketones, ur: NEGATIVE mg/dL
Leukocytes, UA: NEGATIVE
Nitrite: NEGATIVE
Protein, ur: NEGATIVE mg/dL
Specific Gravity, Urine: 1.007 (ref 1.005–1.030)
pH: 7 (ref 5.0–8.0)

## 2015-06-27 NOTE — ED Notes (Signed)
Per EMS; the patient nursing home states that the patient is Altered today thinking that she is having a baby. Per the nursing home the patient is not usually confused.

## 2015-06-27 NOTE — ED Provider Notes (Signed)
CSN: [id number]     Arrival date & time 06/27/15  [id number] History  By signing my name below, I, [name] Ramos, attest that this documentation has been prepared under the direction and in the presence of Veryl Speak, MD. Electronically Signed: Julien Ramos, ED Scribe. 06/27/2015. 6:43 PM.    Chief Complaint  Patient presents with  . Altered Mental Status    LEVEL 5 CAVEAT DUE TO ALTERED MENTAL STATUS  The history is provided by the EMS personnel. The history is limited by the condition of the patient. No language interpreter was used.   HPI Comments: [name] Ramos is a 82 y.o. female brought in by ambulance, who has a PMHx of alzheimer's disease, HTN, DM, chronic kidney disease and fecal impaction presents to the Emergency Department presenting with altered mental status. According to EMS, the patient's nursing home states that the patient is altered today in thinking she is going to have a baby. She had been complaining of abdominal pain but notes it progressed into her thinking she was having a child. Nursing home states the pt is not usually confused. She was seen one week ago for the same symptoms.   Past Medical History  Diagnosis Date  . Alzheimer disease   . Hypertension   . Diabetes mellitus without complication ([place])   . Arthritis   . Hearing impaired   . Cataracts, bilateral   . Recurrent falls   . Hemorrhoids   . Chronic kidney disease, stage IV (severe) 12/14/2012  . Fecal impaction ([place]) 12/15/2012  . Protein-calorie malnutrition, severe ([place]) 12/16/2012  . Underweight 12/17/2012   Past Surgical History  Procedure Laterality Date  . Tonsillectomy    . Appendectomy    . Cataract extraction, bilateral     Family History  Problem Relation Age of Onset  . Colon cancer Neg Hx   . Breast cancer      multiple  . Dementia Sister   . High blood pressure      multiple  . Diabetes      multiple   Social History  Substance Use Topics  . Smoking status: Never Smoker    . Smokeless tobacco: Never Used  . Alcohol Use: No   OB History    No data available     Review of Systems  A complete 10 system review of systems was obtained and all systems are negative except as noted in the HPI and PMH.    LEVEL 5 CAVEAT DUE TO ALTERED MENTAL STATUS Allergies  Ace inhibitors; Codeine; Penicillins; Sulfa antibiotics; and Tramadol  Home Medications   Prior to Admission medications   Medication Sig Start Date End Date Taking? Authorizing Provider  acetaminophen (TYLENOL) 325 MG tablet Take 650 mg by mouth See admin instructions. Take 2 tablets (650 mg) by mouth daily at bedtime, may also take 2 tablets (650 mg) every 6 hours as needed for pain - do not give with 4 hours of scheduled dose    Historical Provider, MD  amLODipine-valsartan (EXFORGE) 10-160 MG per tablet Take 1 tablet by mouth daily.     Historical Provider, MD  aspirin EC 81 MG tablet Take 162 mg by mouth daily.     Historical Provider, MD  Benzocaine-Menthol 15-3.6 MG LOZG Use as directed 1 lozenge in the mouth or throat as needed (sore throat).    Historical Provider, MD  capsaicin (ZOSTRIX) 0.025 % cream Apply topically 2 (two) times daily. Patient not taking: Reported on 03/28/2014 12/18/12  [name] Maxon Rama, MD  cholecalciferol (VITAMIN D) 1000 UNITS tablet Take 1,000 Units by mouth daily.     Historical Provider, MD  diclofenac sodium (VOLTAREN) 1 % GEL Apply 4 g topically 3 (three) times daily as needed (leg pain).     Historical Provider, MD  divalproex (DEPAKOTE SPRINKLE) 125 MG capsule Take 125 mg by mouth 3 (three) times daily. 9am, 2pm, 9pm    Historical Provider, MD  docusate sodium 100 MG CAPS Take 100 mg by mouth 2 (two) times daily. Patient not taking: Reported on 03/28/2014 12/18/12   [name] Maxon Rama, MD  ferrous sulfate 325 (65 FE) MG tablet Take 325 mg by mouth daily.     Historical Provider, MD  hydrocortisone (ANUSOL-HC) 2.5 % rectal cream Place 1 application rectally 2 (two) times  daily as needed for hemorrhoids or itching.    Historical Provider, MD  lidocaine (LIDODERM) 5 % Place 1 patch onto the skin daily. Remove & Discard patch within 12 hours or as directed by MD (apply every morning and remove every night    Historical Provider, MD  LORazepam (ATIVAN) 0.5 MG tablet Take 0.5 mg by mouth 2 (two) times daily.  08/11/13   Historical Provider, MD  metoprolol succinate (TOPROL-XL) 25 MG 24 hr tablet Take 12.5 mg by mouth daily.  08/09/13   Historical Provider, MD  ondansetron (ZOFRAN-ODT) 4 MG disintegrating tablet Take 1 tablet (4 mg total) by mouth every 8 (eight) hours as needed for nausea. 06/23/14   Varney Biles, MD  polyethylene glycol (MIRALAX / GLYCOLAX) packet Take 17 g by mouth daily as needed for mild constipation. Mix in 4 oz of water and drink    Historical Provider, MD  polyvinyl alcohol (LIQUIFILM TEARS) 1.4 % ophthalmic solution Place 2 drops into both eyes every 2 (two) hours as needed for dry eyes.    Historical Provider, MD  sertraline (ZOLOFT) 50 MG tablet Take 50 mg by mouth at bedtime.    Historical Provider, MD  simethicone (MYLICON) 0000000 MG chewable tablet Chew 125 mg by mouth as needed for flatulence.     Historical Provider, MD  traMADol (ULTRAM) 50 MG tablet Take 1 tablet (50 mg total) by mouth every 6 (six) hours as needed. Patient not taking: Reported on 07/10/2014 06/23/14   Varney Biles, MD  triamcinolone cream (KENALOG) 0.1 % Apply 1 application topically every 12 (twelve) hours as needed (rash). Applies to lower legs and arms    Historical Provider, MD  vitamin B-12 (CYANOCOBALAMIN) 1000 MCG tablet Take 1,000 mcg by mouth daily.     Historical Provider, MD  vitamin C (ASCORBIC ACID) 500 MG tablet Take 500 mg by mouth daily.     Historical Provider, MD   Triage vitals: BP 135/41 mmHg  Pulse 57  Temp(Src) 97.8 F (36.6 C) (Oral)  Resp 18  Wt 100 lb (45.36 kg)  SpO2 100% Physical Exam  Constitutional: She appears well-developed and  well-nourished. No distress.  HENT:  Head: Normocephalic and atraumatic.  Eyes: EOM are normal.  Neck: Normal range of motion.  Cardiovascular: Normal rate, regular rhythm and normal heart sounds.   Pulmonary/Chest: Effort normal and breath sounds normal.  Abdominal: Soft. She exhibits no distension. There is no tenderness.  Musculoskeletal: Normal range of motion.  Neurological: She is alert.  Pt is awake and alert, but appears confused and slow to respond. Moves all extremities. Remainder of neuro exam is limited secondary to dementia.  Skin: Skin is warm and dry.  Psychiatric: She has a normal mood and affect. Judgment normal.  Nursing note and vitals reviewed.   ED Course  Procedures  DIAGNOSTIC STUDIES: Oxygen Saturation is 100% on RA, normal by my interpretation.  COORDINATION OF CARE:  6:42 PM Discussed treatment plan with pt at bedside and pt agreed to plan.  Labs Review Labs Reviewed - No data to display  Imaging Review No results found. I have personally reviewed and evaluated these images and lab results as part of my medical decision-making.    MDM   Final diagnoses:  None   Patient sent from the extended care facility for evaluation of mental status change. The workers at the facility feel as though she has more demented. She has apparently told them that she is having a baby. The patient appears very comfortable and I can find nothing on physical examination that is of obvious concern. Her workup is unremarkable including laboratory studies, urinalysis, and head CT. She appears to have waxing and waning dementia, however nothing emergent.  I personally performed the services described in this documentation, which was scribed in my presence. The recorded information has been reviewed and is accurate.      Veryl Speak, MD 06/27/15 2052

## 2015-06-27 NOTE — ED Notes (Signed)
Attempted to called report to the [facility]

## 2015-06-27 NOTE — ED Notes (Signed)
Patient assisted to bed side commode. The patient transferred with max assit. Pateint cleaned and placed back into bed post urination. Patient resting with door open.

## 2015-06-27 NOTE — Discharge Instructions (Signed)
Confusion Confusion is the inability to think with your usual speed or clarity. Confusion may come on quickly or slowly over time. How quickly the confusion comes on depends on the cause. Confusion can be due to any number of causes. CAUSES   Concussion, head injury, or head trauma.  Seizures.  Stroke.  Fever.  Brain tumor.  Age related decreased brain function (dementia).  Heightened emotional states like rage or terror.  Mental illness in which the person loses the ability to determine what is real and what is not (hallucinations).  Infections such as a urinary tract infection (UTI).  Toxic effects from alcohol, drugs, or prescription medicines.  Dehydration and an imbalance of salts in the body (electrolytes).  Lack of sleep.  Low blood sugar (diabetes).  Low levels of oxygen from conditions such as chronic lung disorders.  Drug interactions or other medicine side effects.  Nutritional deficiencies, especially niacin, thiamine, vitamin C, or vitamin B.  Sudden drop in body temperature (hypothermia).  Change in routine, such as when traveling or hospitalized. SIGNS AND SYMPTOMS  People often describe their thinking as cloudy or unclear when they are confused. Confusion can also include feeling disoriented. That means you are unaware of where or who you are. You may also not know what the date or time is. If confused, you may also have difficulty paying attention, remembering, and making decisions. Some people also act aggressively when they are confused.  DIAGNOSIS  The medical evaluation of confusion may include:  Blood and urine tests.  X-rays.  Brain and nervous system tests.  Analyzing your brain waves (electroencephalogram or EEG).  Magnetic resonance imaging (MRI) of your head.  Computed tomography (CT) scan of your head.  Mental status tests in which your health care provider may ask many questions. Some of these questions may seem silly or strange,  but they are a very important test to help diagnose and treat confusion. TREATMENT  An admission to the hospital may not be needed, but a person with confusion should not be left alone. Stay with a family member or friend until the confusion clears. Avoid alcohol, pain relievers, or sedative drugs until you have fully recovered. Do not drive until directed by your health care provider. HOME CARE INSTRUCTIONS  What family and friends can do:  To find out if someone is confused, ask the person to state his or her name, age, and the date. If the person is unsure or answers incorrectly, he or she is confused.  Always introduce yourself, no matter how well the person knows you.  Often remind the person of his or her location.  Place a calendar and clock near the confused person.  Help the person with his or her medicines. You may want to use a pill box, an alarm as a reminder, or give the person each dose as prescribed.  Talk about current events and plans for the day.  Try to keep the environment calm, quiet, and peaceful.  Make sure the person keeps follow-up visits with his or her health care provider. PREVENTION  Ways to prevent confusion:  Avoid alcohol.  Eat a balanced diet.  Get enough sleep.  Take medicine only as directed by your health care provider.  Do not become isolated. Spend time with other people and make plans for your days.  Keep careful watch on your blood sugar levels if you are diabetic. SEEK IMMEDIATE MEDICAL CARE IF:   You develop severe headaches, repeated vomiting, seizures, blackouts, or   slurred speech.  There is increasing confusion, weakness, numbness, restlessness, or personality changes.  You develop a loss of balance, have marked dizziness, feel uncoordinated, or fall.  You have delusions, hallucinations, or develop severe anxiety.  Your family members think you need to be rechecked.   This information is not intended to replace advice given  to you by your health care provider. Make sure you discuss any questions you have with your health care provider.   Document Released: 03/14/2004 Document Revised: 02/25/2014 Document Reviewed: 03/12/2013 Elsevier Interactive Patient Education 2016 [employer]  

## 2015-06-29 ENCOUNTER — Emergency Department (HOSPITAL_BASED_OUTPATIENT_CLINIC_OR_DEPARTMENT_OTHER): Payer: Medicare Other

## 2015-06-29 ENCOUNTER — Emergency Department (HOSPITAL_BASED_OUTPATIENT_CLINIC_OR_DEPARTMENT_OTHER)
Admission: EM | Admit: 2015-06-29 | Discharge: 2015-06-29 | Disposition: A | Discharge status: Home / Self Care | Payer: Medicare Other | Attending: Emergency Medicine | Admitting: Emergency Medicine

## 2015-06-29 ENCOUNTER — Encounter (HOSPITAL_BASED_OUTPATIENT_CLINIC_OR_DEPARTMENT_OTHER): Payer: Self-pay | Admitting: Emergency Medicine

## 2015-06-29 DIAGNOSIS — Z791 Long term (current) use of non-steroidal anti-inflammatories (NSAID): Secondary | ICD-10-CM | POA: Diagnosis not present

## 2015-06-29 DIAGNOSIS — Z79899 Other long term (current) drug therapy: Secondary | ICD-10-CM | POA: Insufficient documentation

## 2015-06-29 DIAGNOSIS — I129 Hypertensive chronic kidney disease with stage 1 through stage 4 chronic kidney disease, or unspecified chronic kidney disease: Secondary | ICD-10-CM | POA: Insufficient documentation

## 2015-06-29 DIAGNOSIS — R109 Unspecified abdominal pain: Secondary | ICD-10-CM | POA: Insufficient documentation

## 2015-06-29 DIAGNOSIS — N184 Chronic kidney disease, stage 4 (severe): Secondary | ICD-10-CM | POA: Insufficient documentation

## 2015-06-29 DIAGNOSIS — G309 Alzheimer's disease, unspecified: Secondary | ICD-10-CM | POA: Diagnosis not present

## 2015-06-29 DIAGNOSIS — M1990 Unspecified osteoarthritis, unspecified site: Secondary | ICD-10-CM | POA: Insufficient documentation

## 2015-06-29 DIAGNOSIS — E1122 Type 2 diabetes mellitus with diabetic chronic kidney disease: Secondary | ICD-10-CM | POA: Insufficient documentation

## 2015-06-29 HISTORY — DX: Other chronic pain: G89.29

## 2015-06-29 HISTORY — DX: Peripheral vascular disease, unspecified: I73.9

## 2015-06-29 HISTORY — DX: Anxiety disorder, unspecified: F41.9

## 2015-06-29 HISTORY — DX: Conduction disorder, unspecified: I45.9

## 2015-06-29 MED ORDER — FAMOTIDINE 20 MG PO TABS: 20.0000 mg | ORAL_TABLET | Freq: Two times a day (BID) | ORAL | Status: DC

## 2015-06-29 NOTE — ED Provider Notes (Signed)
CSN: [id number]     Arrival date & time 06/29/15  1104 History   First MD Initiated Contact with Patient 06/29/15 1119     Chief Complaint  Patient presents with  . Abdominal Pain     (Consider location/radiation/quality/duration/timing/severity/associated sxs/prior Treatment) HPI Patient was brought to the emergency department for evaluation of possible abdominal pain. Patient cannot communicate well due to dementia. She is alert and interactive but very difficult to understand. She was also seen 2 times earlier this month for possible mental status change. There is a report that the patient was grimacing and expressed pain. Past Medical History  Diagnosis Date  . Alzheimer disease   . Hypertension   . Diabetes mellitus without complication ([place])   . Arthritis   . Hearing impaired   . Cataracts, bilateral   . Recurrent falls   . Hemorrhoids   . Fecal impaction ([place]) 12/15/2012  . Protein-calorie malnutrition, severe ([place]) 12/16/2012  . Underweight 12/17/2012  . Anxiety disorder   . Chronic pain   . Heart block   . Peripheral vascular disease ([place])   . Chronic kidney disease, stage IV (severe) 12/14/2012   Past Surgical History  Procedure Laterality Date  . Tonsillectomy    . Appendectomy    . Cataract extraction, bilateral     Family History  Problem Relation Age of Onset  . Colon cancer Neg Hx   . Breast cancer      multiple  . Dementia Sister   . High blood pressure      multiple  . Diabetes      multiple   Social History  Substance Use Topics  . Smoking status: Never Smoker   . Smokeless tobacco: Never Used  . Alcohol Use: No   OB History    No data available     Review of Systems  Cannot do review systems due to dementia level V caveat  Allergies  Ace inhibitors; Codeine; Penicillins; Sulfa antibiotics; and Tramadol  Home Medications   Prior to Admission medications   Medication Sig Start Date End Date Taking? Authorizing Provider   acetaminophen (TYLENOL) 325 MG tablet Take 650 mg by mouth See admin instructions. Take 2 tablets (650 mg) by mouth daily at bedtime, may also take 2 tablets (650 mg) every 6 hours as needed for pain - do not give with 4 hours of scheduled dose   Yes Historical Provider, MD  amLODipine-valsartan (EXFORGE) 10-160 MG per tablet Take 1 tablet by mouth daily.    Yes Historical Provider, MD  aspirin EC 81 MG tablet Take 162 mg by mouth daily.    Yes Historical Provider, MD  Benzocaine-Menthol 15-3.6 MG LOZG Use as directed 1 lozenge in the mouth or throat as needed (sore throat).   Yes Historical Provider, MD  cholecalciferol (VITAMIN D) 1000 UNITS tablet Take 1,000 Units by mouth daily.    Yes Historical Provider, MD  diclofenac sodium (VOLTAREN) 1 % GEL Apply 4 g topically 3 (three) times daily as needed (leg pain).    Yes Historical Provider, MD  divalproex (DEPAKOTE SPRINKLE) 125 MG capsule Take 125 mg by mouth 3 (three) times daily. 9am, 2pm, 9pm   Yes Historical Provider, MD  ferrous sulfate 325 (65 FE) MG tablet Take 325 mg by mouth daily.    Yes Historical Provider, MD  hydrocortisone (ANUSOL-HC) 2.5 % rectal cream Place 1 application rectally 2 (two) times daily as needed for hemorrhoids or itching.   Yes Historical Provider, MD  lidocaine (  LIDODERM) 5 % Place 1 patch onto the skin daily. Remove & Discard patch within 12 hours or as directed by MD (apply every morning and remove every night   Yes Historical Provider, MD  LORazepam (ATIVAN) 0.5 MG tablet Take 0.5 mg by mouth 2 (two) times daily.  08/11/13  Yes Historical Provider, MD  metoprolol succinate (TOPROL-XL) 25 MG 24 hr tablet Take 12.5 mg by mouth daily.  08/09/13  Yes Historical Provider, MD  polyethylene glycol (MIRALAX / GLYCOLAX) packet Take 17 g by mouth daily as needed for mild constipation. Mix in 4 oz of water and drink   Yes Historical Provider, MD  polyvinyl alcohol (LIQUIFILM TEARS) 1.4 % ophthalmic solution Place 2 drops into  both eyes every 2 (two) hours as needed for dry eyes.   Yes Historical Provider, MD  sertraline (ZOLOFT) 50 MG tablet Take 75 mg by mouth at bedtime.    Yes Historical Provider, MD  simethicone (MYLICON) 0000000 MG chewable tablet Chew 125 mg by mouth as needed for flatulence.    Yes Historical Provider, MD  triamcinolone cream (KENALOG) 0.1 % Apply 1 application topically every 12 (twelve) hours as needed (rash). Applies to lower legs and arms   Yes Historical Provider, MD  vitamin C (ASCORBIC ACID) 500 MG tablet Take 500 mg by mouth daily.    Yes Historical Provider, MD  capsaicin (ZOSTRIX) 0.025 % cream Apply topically 2 (two) times daily. Patient not taking: Reported on 03/28/2014 12/18/12   [name], MD  docusate sodium 100 MG CAPS Take 100 mg by mouth 2 (two) times daily. Patient not taking: Reported on 03/28/2014 12/18/12   [name], MD  famotidine (PEPCID) 20 MG tablet Take 1 tablet (20 mg total) by mouth 2 (two) times daily. 06/29/15   Charlesetta Shanks, MD  ondansetron (ZOFRAN-ODT) 4 MG disintegrating tablet Take 1 tablet (4 mg total) by mouth every 8 (eight) hours as needed for nausea. 06/23/14   Varney Biles, MD  traMADol (ULTRAM) 50 MG tablet Take 1 tablet (50 mg total) by mouth every 6 (six) hours as needed. Patient not taking: Reported on 07/10/2014 06/23/14   Varney Biles, MD  vitamin B-12 (CYANOCOBALAMIN) 1000 MCG tablet Take 1,000 mcg by mouth daily.     Historical Provider, MD   BP 135/42 mmHg  Pulse 54  Temp(Src) 98.2 F (36.8 C) (Oral)  Resp 18  SpO2 100% Physical Exam  Constitutional: She is oriented to person, place, and time. She appears well-developed and well-nourished. No distress.  Patient is generally well in appearance. She is nontoxic. She is resting quietly when I come in the room and then awakens with light stimulus. She is cooperative with examination although very hard of hearing. No respiratory distress.  HENT:  Head: Normocephalic and atraumatic.   Nose: Nose normal.  Mouth/Throat: Oropharynx is clear and moist.  Eyes: EOM are normal.  Cardiovascular: Normal rate, regular rhythm, normal heart sounds and intact distal pulses.   Pulmonary/Chest: Effort normal and breath sounds normal.  Abdominal: Soft. Bowel sounds are normal. She exhibits no distension. There is tenderness.  Possible tenderness to palpation. Patient does grimace somewhat with abdominal palpation and seems to endorse pain. Not localized and there is no mass or fullness.  Musculoskeletal: Normal range of motion. She exhibits no edema or tenderness.  Neurological: She is alert and oriented to person, place, and time. She exhibits normal muscle tone. Coordination normal.  Skin: Skin is warm and dry.  Psychiatric: She has  a normal mood and affect.    ED Course  Procedures (including critical care time) Labs Review Labs Reviewed - No data to display  Imaging Review Ct Abdomen Pelvis Wo Contrast  06/29/2015  CLINICAL DATA:  Nausea, vomiting. EXAM: CT ABDOMEN AND PELVIS WITHOUT CONTRAST TECHNIQUE: Multidetector CT imaging of the abdomen and pelvis was performed following the standard protocol without IV contrast. COMPARISON:  CT scan of December 09, 2010. FINDINGS: Grade 1 anterolisthesis of L4-5 is noted secondary to posterior facet joint hypertrophy. Visualized lung bases are unremarkable. No gallstones are noted. Stable right hepatic cysts are noted. Spleen and pancreas appear normal. Adrenal glands appear normal. Nonobstructive calculus is noted in upper pole collecting system of right kidney. Minimal right hydroureteronephrosis is noted without evidence of obstructing calculus. Stable exophytic calcified cyst is seen arising from lower pole of left kidney. Atherosclerosis of abdominal aorta is noted without aneurysm formation. There is no evidence of bowel obstruction. No abnormal fluid collection is noted. Patient is reportedly status post appendectomy. Uterus and ovaries are  unremarkable. Urinary bladder appears normal. No significant adenopathy is noted. IMPRESSION: Atherosclerosis abdominal aorta without aneurysm formation. Nonobstructive right renal calculus. Minimal right hydronephrosis is noted without evidence of obstructing calculus. Continued presence of exophytic calcified cyst arising from lower pole of left kidney. Stable right hepatic cysts. Electronically Signed   By: Marijo Conception, M.D.   On: 06/29/2015 14:00   Ct Head Wo Contrast  06/27/2015  CLINICAL DATA:  82 year old female with altered mental status EXAM: CT HEAD WITHOUT CONTRAST TECHNIQUE: Contiguous axial images were obtained from the base of the skull through the vertex without intravenous contrast. COMPARISON:  Head CT dated 04/10/2015 FINDINGS: There is stable atrophy and chronic microvascular ischemic changes. There is no intracranial hemorrhage. No mass effect or midline shift. An empty sella is incidentally noted. There is minimal mucoperiosteal thickening of the paranasal sinuses. No air-fluid levels. The mastoid air cells are clear. The calvarium is intact. IMPRESSION: No acute intracranial hemorrhage. Age-related atrophy and chronic microvascular ischemic disease. If symptoms persist and there are no contraindications, MRI may provide better evaluation if clinically indicated. Electronically Signed   By: Anner Crete M.D.   On: 06/27/2015 20:07   I have personally reviewed and evaluated these images and lab results as part of my medical decision-making.   EKG Interpretation None      MDM   Final diagnoses:  Abdominal pain, unspecified abdominal location   Patient has significant dementia and is hard of hearing. Communication is challenging. She is however alert and follows commands. Patient does not seem significantly confused. Her vital signs are stable. Patient had diagnostic labs done 2 and 9 days ago. This included two urinalysis. These are both negative. Clinically the patient  does not appear ill. CT scan was pursued to rule out intra-abdominal pathology. No acute pathology is present. Patient does not show signs of constipation or impaction. At this time recommendation is to initiate twice daily Pepcid and have the patient reassessed by the facility provider.    Charlesetta Shanks, MD 06/29/15 1504

## 2015-06-29 NOTE — ED Notes (Signed)
Report called to [facility]

## 2015-06-29 NOTE — ED Notes (Signed)
Per [facility] they were called to [facility] for patient, who per staff is at baseline mentally, but when attempting to stand, staff stated that patient hunched over and was grimacing, no other s/s, pt resting on arrival, cbg 86 via ems,

## 2015-06-29 NOTE — ED Notes (Signed)
Patient transported to CT 

## 2015-06-29 NOTE — Discharge Instructions (Signed)
Abdominal Pain, Adult You were evaluated for abdominal pain. A CT scan was done which does not show any significant abnormalities at this time. The cause of your pain is uncertain. Take Pepcid twice daily as prescribed. At this time there is no sign of constipation. You had a urine test and labs that were done both 2 and 10 days ago that were normal. You should have a recheck with your family doctor within the next couple days to see if her symptoms are improved. Many things can cause abdominal pain. Usually, abdominal pain is not caused by a disease and will improve without treatment. It can often be observed and treated at home. Your health care provider will do a physical exam and possibly order blood tests and X-rays to help determine the seriousness of your pain. However, in many cases, more time must pass before a clear cause of the pain can be found. Before that point, your health care provider may not know if you need more testing or further treatment. HOME CARE INSTRUCTIONS Monitor your abdominal pain for any changes. The following actions may help to alleviate any discomfort you are experiencing:  Only take over-the-counter or prescription medicines as directed by your health care provider.  Do not take laxatives unless directed to do so by your health care provider.  Try a clear liquid diet (broth, tea, or water) as directed by your health care provider. Slowly move to a bland diet as tolerated. SEEK MEDICAL CARE IF:  You have unexplained abdominal pain.  You have abdominal pain associated with nausea or diarrhea.  You have pain when you urinate or have a bowel movement.  You experience abdominal pain that wakes you in the night.  You have abdominal pain that is worsened or improved by eating food.  You have abdominal pain that is worsened with eating fatty foods.  You have a fever. SEEK IMMEDIATE MEDICAL CARE IF:  Your pain does not go away within 2 hours.  You keep throwing  up (vomiting).  Your pain is felt only in portions of the abdomen, such as the right side or the left lower portion of the abdomen.  You pass bloody or black tarry stools. MAKE SURE YOU:  Understand these instructions.  Will watch your condition.  Will get help right away if you are not doing well or get worse.   This information is not intended to replace advice given to you by your health care provider. Make sure you discuss any questions you have with your health care provider.   Document Released: 11/14/2004 Document Revised: 10/26/2014 Document Reviewed: 10/14/2012 Elsevier Interactive Patient Education [employer].

## 2015-06-29 NOTE — ED Notes (Signed)
[facility] called for transport.  

## 2015-07-09 ENCOUNTER — Encounter (HOSPITAL_COMMUNITY): Payer: Self-pay | Admitting: Emergency Medicine

## 2015-07-09 ENCOUNTER — Emergency Department (HOSPITAL_COMMUNITY): Payer: Medicare Other

## 2015-07-09 ENCOUNTER — Emergency Department (HOSPITAL_COMMUNITY)
Admission: EM | Admit: 2015-07-09 | Discharge: 2015-07-09 | Disposition: A | Discharge status: Home / Self Care | Payer: Medicare Other | Attending: Emergency Medicine | Admitting: Emergency Medicine

## 2015-07-09 DIAGNOSIS — Y939 Activity, unspecified: Secondary | ICD-10-CM | POA: Diagnosis not present

## 2015-07-09 DIAGNOSIS — G309 Alzheimer's disease, unspecified: Secondary | ICD-10-CM | POA: Diagnosis not present

## 2015-07-09 DIAGNOSIS — N184 Chronic kidney disease, stage 4 (severe): Secondary | ICD-10-CM | POA: Diagnosis not present

## 2015-07-09 DIAGNOSIS — F0390 Unspecified dementia without behavioral disturbance: Secondary | ICD-10-CM | POA: Diagnosis present

## 2015-07-09 DIAGNOSIS — Z7982 Long term (current) use of aspirin: Secondary | ICD-10-CM | POA: Insufficient documentation

## 2015-07-09 DIAGNOSIS — M79604 Pain in right leg: Secondary | ICD-10-CM | POA: Diagnosis not present

## 2015-07-09 DIAGNOSIS — Z79899 Other long term (current) drug therapy: Secondary | ICD-10-CM | POA: Insufficient documentation

## 2015-07-09 DIAGNOSIS — Y929 Unspecified place or not applicable: Secondary | ICD-10-CM | POA: Diagnosis not present

## 2015-07-09 DIAGNOSIS — R52 Pain, unspecified: Secondary | ICD-10-CM

## 2015-07-09 DIAGNOSIS — Y999 Unspecified external cause status: Secondary | ICD-10-CM | POA: Insufficient documentation

## 2015-07-09 DIAGNOSIS — E119 Type 2 diabetes mellitus without complications: Secondary | ICD-10-CM | POA: Insufficient documentation

## 2015-07-09 DIAGNOSIS — I129 Hypertensive chronic kidney disease with stage 1 through stage 4 chronic kidney disease, or unspecified chronic kidney disease: Secondary | ICD-10-CM | POA: Diagnosis not present

## 2015-07-09 DIAGNOSIS — W19XXXA Unspecified fall, initial encounter: Secondary | ICD-10-CM | POA: Diagnosis not present

## 2015-07-09 NOTE — ED Notes (Signed)
Called [facility] for transport.  

## 2015-07-09 NOTE — Discharge Instructions (Signed)
You were seen and evaluated today following a possible fall. There was no sign of trauma to the injury on your examination in your x-rays were unremarkable. Please follow-up with her primary care physician as needed. Please do not get out of bed without assistance.  Fall Prevention in Hospitals, Adult As a hospital patient, your condition and the treatments you receive can increase your risk for falls. Some additional risk factors for falls in a hospital include:  Being in an unfamiliar environment.  Being on bed rest.  Your surgery.  Taking certain medicines.  Your tubing requirements, such as intravenous (IV) therapy or catheters. It is important that you learn how to decrease fall risks while at the hospital. Below are important tips that can help prevent falls. SAFETY TIPS FOR PREVENTING FALLS Talk about your risk of falling.  Ask your health care provider why you are at risk for falling. Is it your medicine, illness, tubing placement, or something else?  Make a plan with your health care provider to keep you safe from falls.  Ask your health care provider or pharmacist about side effects of your medicines. Some medicines can make you dizzy or affect your coordination. Ask for help.  Ask for help before getting out of bed. You may need to press your call button.  Ask for assistance in getting safely to the toilet.  Ask for a walker or cane to be put at your bedside. Ask that most of the side rails on your bed be placed up before your health care provider leaves the room.  Ask family or friends to sit with you.  Ask for things that are out of your reach, such as your glasses, hearing aids, telephone, bedside table, or call button. Follow these tips to avoid falling:  Stay lying or seated, rather than standing, while waiting for help.  Wear rubber-soled slippers or shoes whenever you walk in the hospital.  Avoid quick, sudden movements.  Change positions slowly.  Sit on  the side of your bed before standing.  Stand up slowly and wait before you start to walk.  Let your health care provider know if there is a spill on the floor.  Pay careful attention to the medical equipment, electrical cords, and tubes around you.  When you need help, use your call button by your bed or in the bathroom. Wait for one of your health care providers to help you.  If you feel dizzy or unsure of your footing, return to bed and wait for assistance.  Avoid being distracted by the TV, telephone, or another person in your room.  Do not lean or support yourself on rolling objects, such as IV poles or bedside tables.   This information is not intended to replace advice given to you by your health care provider. Make sure you discuss any questions you have with your health care provider.   Document Released: 02/02/2000 Document Revised: 02/25/2014 Document Reviewed: 10/13/2011 Elsevier Interactive Patient Education [employer].

## 2015-07-09 NOTE — ED Notes (Signed)
Per [facility] Spt from [facility]. Unwitnessed fall per staff per ems. Pt was found sitting on floor. No obvious injury. Per EMS pt has dementia and alert and  disoriented x 4 normal to baseline. Pt can be aggressive per ems .

## 2015-07-09 NOTE — ED Notes (Signed)
Bed: [facility] Expected date:  Expected time:  Means of arrival:  Comments: EMS- 82 yo-  Fall, no injury, eval only

## 2015-07-12 ENCOUNTER — Emergency Department (HOSPITAL_COMMUNITY)
Admission: EM | Admit: 2015-07-12 | Discharge: 2015-07-12 | Disposition: A | Discharge status: Home / Self Care | Payer: Medicare Other | Attending: Emergency Medicine | Admitting: Emergency Medicine

## 2015-07-12 ENCOUNTER — Encounter (HOSPITAL_COMMUNITY): Payer: Self-pay | Admitting: Emergency Medicine

## 2015-07-12 ENCOUNTER — Emergency Department (HOSPITAL_COMMUNITY): Payer: Medicare Other

## 2015-07-12 DIAGNOSIS — G309 Alzheimer's disease, unspecified: Secondary | ICD-10-CM | POA: Diagnosis not present

## 2015-07-12 DIAGNOSIS — Y939 Activity, unspecified: Secondary | ICD-10-CM | POA: Insufficient documentation

## 2015-07-12 DIAGNOSIS — E119 Type 2 diabetes mellitus without complications: Secondary | ICD-10-CM | POA: Insufficient documentation

## 2015-07-12 DIAGNOSIS — Z7982 Long term (current) use of aspirin: Secondary | ICD-10-CM | POA: Insufficient documentation

## 2015-07-12 DIAGNOSIS — Z79899 Other long term (current) drug therapy: Secondary | ICD-10-CM | POA: Insufficient documentation

## 2015-07-12 DIAGNOSIS — I129 Hypertensive chronic kidney disease with stage 1 through stage 4 chronic kidney disease, or unspecified chronic kidney disease: Secondary | ICD-10-CM | POA: Insufficient documentation

## 2015-07-12 DIAGNOSIS — N184 Chronic kidney disease, stage 4 (severe): Secondary | ICD-10-CM | POA: Diagnosis not present

## 2015-07-12 DIAGNOSIS — Y999 Unspecified external cause status: Secondary | ICD-10-CM | POA: Diagnosis not present

## 2015-07-12 DIAGNOSIS — M25561 Pain in right knee: Secondary | ICD-10-CM | POA: Diagnosis not present

## 2015-07-12 DIAGNOSIS — W19XXXA Unspecified fall, initial encounter: Secondary | ICD-10-CM | POA: Diagnosis not present

## 2015-07-12 DIAGNOSIS — Y92128 Other place in nursing home as the place of occurrence of the external cause: Secondary | ICD-10-CM | POA: Insufficient documentation

## 2015-07-12 NOTE — ED Notes (Signed)
[facility] called  

## 2015-07-12 NOTE — ED Notes (Signed)
Bed: [facility] Expected date:  Expected time:  Means of arrival:  Comments: 

## 2015-07-12 NOTE — ED Provider Notes (Signed)
CSN: [id number]     Arrival date & time 07/12/15  [id number] History   First MD Initiated Contact with Patient 07/12/15 1000     Chief Complaint  Patient presents with  . Fall  . Leg Pain     Level V caveat: Dementia  HPI Patient presents from the nursing home today after an unwitnessed fall this morning. EMS reports the patient was complaining of right knee and leg pain.  Staff at the facility report baseline mental status.  Patient has a history of dementia and is unable to provide any history regarding the fall.  The patient was found by staff sitting on the floor.   Past Medical History  Diagnosis Date  . Alzheimer disease   . Hypertension   . Diabetes mellitus without complication ([place])   . Arthritis   . Hearing impaired   . Cataracts, bilateral   . Recurrent falls   . Hemorrhoids   . Fecal impaction ([place]) 12/15/2012  . Protein-calorie malnutrition, severe ([place]) 12/16/2012  . Underweight 12/17/2012  . Anxiety disorder   . Chronic pain   . Heart block   . Peripheral vascular disease ([place])   . Chronic kidney disease, stage IV (severe) 12/14/2012   Past Surgical History  Procedure Laterality Date  . Tonsillectomy    . Appendectomy    . Cataract extraction, bilateral     Family History  Problem Relation Age of Onset  . Colon cancer Neg Hx   . Breast cancer      multiple  . Dementia Sister   . High blood pressure      multiple  . Diabetes      multiple   Social History  Substance Use Topics  . Smoking status: Never Smoker   . Smokeless tobacco: Never Used  . Alcohol Use: No   OB History    No data available     Review of Systems  Unable to perform ROS: Dementia      Allergies  Ace inhibitors; Codeine; Penicillins; Sulfa antibiotics; and Tramadol  Home Medications   Prior to Admission medications   Medication Sig Start Date End Date Taking? Authorizing Provider  acetaminophen (TYLENOL) 325 MG tablet Take 650 mg by mouth every 6 (six) hours as needed  for moderate pain.    Yes Historical Provider, MD  amLODipine-valsartan (EXFORGE) 10-160 MG per tablet Take 1 tablet by mouth daily.    Yes Historical Provider, MD  aspirin EC 81 MG tablet Take 81 mg by mouth daily.    Yes Historical Provider, MD  Benzocaine-Menthol 15-3.6 MG LOZG Use as directed 1 lozenge in the mouth or throat as needed (sore throat).   Yes Historical Provider, MD  cholecalciferol (VITAMIN D) 1000 UNITS tablet Take 1,000 Units by mouth daily.    Yes Historical Provider, MD  diclofenac sodium (VOLTAREN) 1 % GEL Apply 4 g topically 2 (two) times daily as needed (leg pain).    Yes Historical Provider, MD  divalproex (DEPAKOTE SPRINKLE) 125 MG capsule Take 125-250 mg by mouth 3 (three) times daily. Takes 250 mg twice daily and 125 mg at bedtime.   Yes Historical Provider, MD  ferrous sulfate 325 (65 FE) MG tablet Take 325 mg by mouth daily.    Yes Historical Provider, MD  lidocaine (LIDODERM) 5 % Place 1 patch onto the skin daily. Remove & Discard patch within 12 hours or as directed by MD (apply every morning and remove every night   Yes Historical Provider, MD  LORazepam (ATIVAN) 0.5 MG tablet Take 0.5 mg by mouth 2 (two) times daily. May take an additional 0.25mg s every 6 hours as needed for agitation 08/11/13  Yes Historical Provider, MD  metoprolol succinate (TOPROL-XL) 25 MG 24 hr tablet Take 12.5 mg by mouth daily.  08/09/13  Yes Historical Provider, MD  ondansetron (ZOFRAN-ODT) 4 MG disintegrating tablet Take 1 tablet (4 mg total) by mouth every 8 (eight) hours as needed for nausea. 06/23/14  Yes Varney Biles, MD  polyethylene glycol (MIRALAX / GLYCOLAX) packet Take 17 g by mouth daily as needed for mild constipation. Mix in 4 oz of water and drink   Yes Historical Provider, MD  polyvinyl alcohol (LIQUIFILM TEARS) 1.4 % ophthalmic solution Place 2 drops into both eyes every 2 (two) hours as needed for dry eyes.   Yes Historical Provider, MD  sertraline (ZOLOFT) 25 MG tablet Take 75  mg by mouth daily.   Yes Historical Provider, MD  simethicone (MYLICON) 0000000 MG chewable tablet Chew 125 mg by mouth as needed for flatulence.    Yes Historical Provider, MD  vitamin C (ASCORBIC ACID) 500 MG tablet Take 500 mg by mouth daily.    Yes Historical Provider, MD   BP 176/82 mmHg  Pulse 71  Temp(Src) 98.2 F (36.8 C) (Oral)  Resp 18  SpO2 99% Physical Exam  Constitutional: She appears well-developed and well-nourished. No distress.  HENT:  Head: Normocephalic and atraumatic.  Eyes: EOM are normal.  Neck: Normal range of motion. Neck supple.  No C-spine tenderness  Cardiovascular: Normal rate, regular rhythm and normal heart sounds.   Pulmonary/Chest: Effort normal and breath sounds normal. She exhibits no tenderness.  Abdominal: Soft. She exhibits no distension. There is no tenderness.  Musculoskeletal: Normal range of motion.  Normal grip strength bilaterally.  Wiggles toes.  Pain with range of motion of right knee.  Full range of motion of bilateral hips and ankles.  Full range of motion bilateral wrists, elbows, shoulders.  No obvious deformity of the right lower extremity.  No shortening or rotation of the right lower extremity.  Neurological: She is alert.  Skin: Skin is warm and dry.  Psychiatric: She has a normal mood and affect. Judgment normal.  Nursing note and vitals reviewed.   ED Course  Procedures (including critical care time) Labs Review Labs Reviewed - No data to display  Imaging Review Dg Knee Complete 4 Views Right  07/12/2015  CLINICAL DATA:  Fall this morning, right knee pain EXAM: RIGHT KNEE - COMPLETE 4+ VIEW COMPARISON:  07/09/2015 FINDINGS: Five views of the right knee submitted. No acute fracture or subluxation. There is diffuse osteopenia. Significant narrowing of joint space especially lateral compartment. Narrowing of patellofemoral joint space. Small joint effusion. Atherosclerotic calcifications of femoral artery. IMPRESSION: No acute  fracture or subluxation. Diffuse osteopenia. Osteoarthritic changes as described above. Small joint effusion. Electronically Signed   By: Lahoma Crocker M.D.   On: 07/12/2015 11:02   I have personally reviewed and evaluated these images and lab results as part of my medical decision-making.   EKG Interpretation None      MDM   Final diagnoses:  Right knee pain  Fall, initial encounter    Arthritis of right knee.  No acute fracture.  Full range of motion of bilateral hips.  Abdomen benign.  Chest nontender.  C-spine nontender.  No obvious trauma to head and neck.  Baseline mental status.  Patient be discharged back to the nursing facility at this  time.    Jola Schmidt, MD 07/12/15 [phone]

## 2015-07-12 NOTE — ED Notes (Signed)
Patient here from [facility] with complaints of unwitnessed fall this morning. Right leg pain upon palpation. Hx of dementia.

## 2015-07-16 NOTE — ED Provider Notes (Signed)
CSN: [id number]     Arrival date & time 07/09/15  1104 History   First MD Initiated Contact with Patient 07/09/15 1148     Chief Complaint  Patient presents with  . Fall     (Consider location/radiation/quality/duration/timing/severity/associated sxs/prior Treatment) HPI Comments: 82 y.o. Female with history of dementia presents for her nursing facility for presumed unwitnessed fall.  The patient was reportedly found sitting on her bottom next to her bed in no acute distress.  Patient is reported to be at her mental status baseline.  She is unable to give any history.    Patient is a 82 y.o. female presenting with fall.  Fall    Past Medical History  Diagnosis Date  . Alzheimer disease   . Hypertension   . Diabetes mellitus without complication ([place])   . Arthritis   . Hearing impaired   . Cataracts, bilateral   . Recurrent falls   . Hemorrhoids   . Fecal impaction ([place]) 12/15/2012  . Protein-calorie malnutrition, severe ([place]) 12/16/2012  . Underweight 12/17/2012  . Anxiety disorder   . Chronic pain   . Heart block   . Peripheral vascular disease ([place])   . Chronic kidney disease, stage IV (severe) 12/14/2012   Past Surgical History  Procedure Laterality Date  . Tonsillectomy    . Appendectomy    . Cataract extraction, bilateral     Family History  Problem Relation Age of Onset  . Colon cancer Neg Hx   . Breast cancer      multiple  . Dementia Sister   . High blood pressure      multiple  . Diabetes      multiple   Social History  Substance Use Topics  . Smoking status: Never Smoker   . Smokeless tobacco: Never Used  . Alcohol Use: No   OB History    No data available     Review of Systems  Unable to perform ROS: Dementia      Allergies  Ace inhibitors; Codeine; Penicillins; Sulfa antibiotics; and Tramadol  Home Medications   Prior to Admission medications   Medication Sig Start Date End Date Taking? Authorizing Provider  acetaminophen  (TYLENOL) 325 MG tablet Take 650 mg by mouth every 6 (six) hours as needed for moderate pain.    Yes Historical Provider, MD  amLODipine-valsartan (EXFORGE) 10-160 MG per tablet Take 1 tablet by mouth daily.    Yes Historical Provider, MD  aspirin EC 81 MG tablet Take 81 mg by mouth daily.    Yes Historical Provider, MD  Benzocaine-Menthol 15-3.6 MG LOZG Use as directed 1 lozenge in the mouth or throat as needed (sore throat).   Yes Historical Provider, MD  cholecalciferol (VITAMIN D) 1000 UNITS tablet Take 1,000 Units by mouth daily.    Yes Historical Provider, MD  diclofenac sodium (VOLTAREN) 1 % GEL Apply 4 g topically 2 (two) times daily as needed (leg pain).    Yes Historical Provider, MD  divalproex (DEPAKOTE SPRINKLE) 125 MG capsule Take 125-250 mg by mouth 3 (three) times daily. Takes 250 mg twice daily and 125 mg at bedtime.   Yes Historical Provider, MD  ferrous sulfate 325 (65 FE) MG tablet Take 325 mg by mouth daily.    Yes Historical Provider, MD  lidocaine (LIDODERM) 5 % Place 1 patch onto the skin daily. Remove & Discard patch within 12 hours or as directed by MD (apply every morning and remove every night   Yes Historical Provider,  MD  LORazepam (ATIVAN) 0.5 MG tablet Take 0.5 mg by mouth 2 (two) times daily. May take an additional 0.25mg s every 6 hours as needed for agitation 08/11/13  Yes Historical Provider, MD  metoprolol succinate (TOPROL-XL) 25 MG 24 hr tablet Take 12.5 mg by mouth daily.  08/09/13  Yes Historical Provider, MD  ondansetron (ZOFRAN-ODT) 4 MG disintegrating tablet Take 1 tablet (4 mg total) by mouth every 8 (eight) hours as needed for nausea. 06/23/14  Yes Varney Biles, MD  polyethylene glycol (MIRALAX / GLYCOLAX) packet Take 17 g by mouth daily as needed for mild constipation. Mix in 4 oz of water and drink   Yes Historical Provider, MD  polyvinyl alcohol (LIQUIFILM TEARS) 1.4 % ophthalmic solution Place 2 drops into both eyes every 2 (two) hours as needed for dry  eyes.   Yes Historical Provider, MD  sertraline (ZOLOFT) 25 MG tablet Take 75 mg by mouth daily.   Yes Historical Provider, MD  simethicone (MYLICON) 0000000 MG chewable tablet Chew 125 mg by mouth as needed for flatulence.    Yes Historical Provider, MD  vitamin C (ASCORBIC ACID) 500 MG tablet Take 500 mg by mouth daily.    Yes Historical Provider, MD   BP 145/102 mmHg  Pulse 85  Temp(Src) 97.6 F (36.4 C)  Resp 20  SpO2 100% Physical Exam  Constitutional: No distress.  HENT:  Head: Normocephalic and atraumatic.  Right Ear: External ear normal.  Left Ear: External ear normal.  Nose: Nose normal.  Mouth/Throat: Oropharynx is clear and moist. No oropharyngeal exudate.  Eyes: EOM are normal. Pupils are equal, round, and reactive to light. Right eye exhibits no discharge. Left eye exhibits no discharge.  Neck: Normal range of motion and full passive range of motion without pain. Neck supple. No spinous process tenderness and no muscular tenderness present.  Cardiovascular: Normal rate and intact distal pulses.   Pulmonary/Chest: Effort normal. No respiratory distress. She has no rales. She exhibits no tenderness.  Abdominal: Soft. She exhibits no distension. There is no tenderness.  Musculoskeletal: Normal range of motion.  Patient with full passive ROM of all extremities on examination.  She does appear to grimace and express pain with elevation of the right leg.  No sign of swelling or injury.  No deformity at any of the joints.  Neurological: She is alert. She exhibits normal muscle tone.  Skin: She is not diaphoretic.  Vitals reviewed.   ED Course  Procedures (including critical care time) Labs Review Labs Reviewed - No data to display  Imaging Review No results found. I have personally reviewed and evaluated these images and lab results as part of my medical decision-making.   EKG Interpretation None      MDM  Patient was seen and evaluated in stable condition.  Patient  only appeared to have discomfort with movement of the right leg.  Xrays of the pelvis/hip, right femur and right tib fib with arthritic changes of the knee but otherwise unremarkable.  No sign of trauma or injury on rest of head to toe examination.  Patient discharged back to her facility in stable condition. Final diagnoses:  Fall, initial encounter    1. Vern Claude, MD 07/16/15 2056

## 2015-07-18 ENCOUNTER — Emergency Department (HOSPITAL_COMMUNITY): Payer: Medicare Other

## 2015-07-18 ENCOUNTER — Emergency Department (HOSPITAL_COMMUNITY)
Admission: EM | Admit: 2015-07-18 | Discharge: 2015-07-18 | Disposition: A | Discharge status: Skilled Nursing Facility | Payer: Medicare Other | Attending: Emergency Medicine | Admitting: Emergency Medicine

## 2015-07-18 ENCOUNTER — Encounter (HOSPITAL_COMMUNITY): Payer: Self-pay | Admitting: Nurse Practitioner

## 2015-07-18 DIAGNOSIS — M1990 Unspecified osteoarthritis, unspecified site: Secondary | ICD-10-CM

## 2015-07-18 DIAGNOSIS — W19XXXA Unspecified fall, initial encounter: Secondary | ICD-10-CM

## 2015-07-18 DIAGNOSIS — N184 Chronic kidney disease, stage 4 (severe): Secondary | ICD-10-CM | POA: Diagnosis not present

## 2015-07-18 DIAGNOSIS — Y929 Unspecified place or not applicable: Secondary | ICD-10-CM | POA: Diagnosis not present

## 2015-07-18 DIAGNOSIS — M25561 Pain in right knee: Secondary | ICD-10-CM | POA: Insufficient documentation

## 2015-07-18 DIAGNOSIS — Z7982 Long term (current) use of aspirin: Secondary | ICD-10-CM | POA: Insufficient documentation

## 2015-07-18 DIAGNOSIS — N390 Urinary tract infection, site not specified: Secondary | ICD-10-CM | POA: Diagnosis not present

## 2015-07-18 DIAGNOSIS — M25562 Pain in left knee: Secondary | ICD-10-CM | POA: Insufficient documentation

## 2015-07-18 DIAGNOSIS — E119 Type 2 diabetes mellitus without complications: Secondary | ICD-10-CM | POA: Insufficient documentation

## 2015-07-18 DIAGNOSIS — Z79899 Other long term (current) drug therapy: Secondary | ICD-10-CM | POA: Diagnosis not present

## 2015-07-18 DIAGNOSIS — Y939 Activity, unspecified: Secondary | ICD-10-CM | POA: Insufficient documentation

## 2015-07-18 DIAGNOSIS — R0789 Other chest pain: Secondary | ICD-10-CM | POA: Insufficient documentation

## 2015-07-18 DIAGNOSIS — F0390 Unspecified dementia without behavioral disturbance: Secondary | ICD-10-CM | POA: Diagnosis present

## 2015-07-18 DIAGNOSIS — Y999 Unspecified external cause status: Secondary | ICD-10-CM | POA: Insufficient documentation

## 2015-07-18 DIAGNOSIS — I129 Hypertensive chronic kidney disease with stage 1 through stage 4 chronic kidney disease, or unspecified chronic kidney disease: Secondary | ICD-10-CM | POA: Insufficient documentation

## 2015-07-18 LAB — URINALYSIS, ROUTINE W REFLEX MICROSCOPIC
GLUCOSE, UA: NEGATIVE mg/dL
Ketones, ur: NEGATIVE mg/dL
Nitrite: NEGATIVE
PROTEIN: NEGATIVE mg/dL
Specific Gravity, Urine: 1.02 (ref 1.005–1.030)
pH: 5 (ref 5.0–8.0)

## 2015-07-18 LAB — BASIC METABOLIC PANEL
Anion gap: 10 (ref 5–15)
BUN: 15 mg/dL (ref 6–20)
CO2: 27 mmol/L (ref 22–32)
CREATININE: 1.11 mg/dL — AB (ref 0.44–1.00)
Calcium: 8.8 mg/dL — ABNORMAL LOW (ref 8.9–10.3)
Chloride: 100 mmol/L — ABNORMAL LOW (ref 101–111)
GFR calc Af Amer: 47 mL/min — ABNORMAL LOW (ref >60)
GFR, EST NON AFRICAN AMERICAN: 41 mL/min — AB (ref >60)
GLUCOSE: 85 mg/dL (ref 65–99)
POTASSIUM: 4 mmol/L (ref 3.5–5.1)
Sodium: 137 mmol/L (ref 135–145)

## 2015-07-18 LAB — CBC WITH DIFFERENTIAL/PLATELET
Basophils Absolute: 0 10*3/uL (ref 0.0–0.1)
Basophils Relative: 0 %
EOS PCT: 6 %
Eosinophils Absolute: 0.4 10*3/uL (ref 0.0–0.7)
HEMATOCRIT: 33.4 % — AB (ref 36.0–46.0)
HEMOGLOBIN: 11 g/dL — AB (ref 12.0–15.0)
LYMPHS ABS: 1.7 10*3/uL (ref 0.7–4.0)
LYMPHS PCT: 28 %
MCH: 23.7 pg — AB (ref 26.0–34.0)
MCHC: 32.9 g/dL (ref 30.0–36.0)
MCV: 71.8 fL — AB (ref 78.0–100.0)
MONO ABS: 0.9 10*3/uL (ref 0.1–1.0)
Monocytes Relative: 14 %
Neutro Abs: 3 10*3/uL (ref 1.7–7.7)
Neutrophils Relative %: 52 %
Platelets: 314 10*3/uL (ref 150–400)
RBC: 4.65 MIL/uL (ref 3.87–5.11)
RDW: 15.5 % (ref 11.5–15.5)
WBC: 5.9 10*3/uL (ref 4.0–10.5)

## 2015-07-18 LAB — URINE MICROSCOPIC-ADD ON

## 2015-07-18 MED ORDER — CIPROFLOXACIN HCL 250 MG PO TABS: 250.0000 mg | ORAL_TABLET | Freq: Two times a day (BID) | ORAL | Status: DC

## 2015-07-18 MED ORDER — CIPROFLOXACIN HCL 500 MG PO TABS
250.0000 mg | ORAL_TABLET | Freq: Once | ORAL | Status: AC
  Administered 2015-07-18: 250 mg via ORAL
  Filled 2015-07-18: qty 1

## 2015-07-18 NOTE — ED Notes (Signed)
Unable to collect labs at this time patient is in xray 

## 2015-07-18 NOTE — ED Provider Notes (Signed)
CSN: [id number]     Arrival date & time 07/18/15  1509 History   First MD Initiated Contact with Patient 07/18/15 1552     Chief Complaint  Patient presents with  . Fall     (Consider location/radiation/quality/duration/timing/severity/associated sxs/prior Treatment) HPI   [name] Ramos is a 82 y.o. female presents for evaluation of fall, found on floor. She is unable to give any history.  Level V caveat- dementia   Past Medical History  Diagnosis Date  . Alzheimer disease   . Hypertension   . Diabetes mellitus without complication ([place])   . Arthritis   . Hearing impaired   . Cataracts, bilateral   . Recurrent falls   . Hemorrhoids   . Fecal impaction ([place]) 12/15/2012  . Protein-calorie malnutrition, severe ([place]) 12/16/2012  . Underweight 12/17/2012  . Anxiety disorder   . Chronic pain   . Heart block   . Peripheral vascular disease ([place])   . Chronic kidney disease, stage IV (severe) 12/14/2012   Past Surgical History  Procedure Laterality Date  . Tonsillectomy    . Appendectomy    . Cataract extraction, bilateral     Family History  Problem Relation Age of Onset  . Colon cancer Neg Hx   . Breast cancer      multiple  . Dementia Sister   . High blood pressure      multiple  . Diabetes      multiple   Social History  Substance Use Topics  . Smoking status: Never Smoker   . Smokeless tobacco: Never Used  . Alcohol Use: No   OB History    No data available     Review of Systems  Unable to perform ROS: Dementia      Allergies  Ace inhibitors; Codeine; Penicillins; Sulfa antibiotics; and Tramadol  Home Medications   Prior to Admission medications   Medication Sig Start Date End Date Taking? Authorizing Provider  amLODipine-valsartan (EXFORGE) 10-160 MG per tablet Take 1 tablet by mouth daily.    Yes Historical Provider, MD  aspirin EC 81 MG tablet Take 81 mg by mouth daily.    Yes Historical Provider, MD  cholecalciferol (VITAMIN D) 1000  UNITS tablet Take 1,000 Units by mouth daily.    Yes Historical Provider, MD  divalproex (DEPAKOTE SPRINKLE) 125 MG capsule Take 125-250 mg by mouth 3 (three) times daily. Take 2 capsules (250 mg) by mouth bid and Take 1 capsule (125 mg) at bedtime   Yes Historical Provider, MD  ferrous sulfate 325 (65 FE) MG tablet Take 325 mg by mouth daily.    Yes Historical Provider, MD  lidocaine (LIDODERM) 5 % Place 1 patch onto the skin daily. Remove & Discard patch within 12 hours or as directed by MD (apply every morning and remove every night   Yes Historical Provider, MD  LORazepam (ATIVAN) 0.5 MG tablet Take 0.5 mg by mouth 2 (two) times daily. May take an additional 0.25mg s every 6 hours as needed for agitation 08/11/13  Yes Historical Provider, MD  metoprolol succinate (TOPROL-XL) 25 MG 24 hr tablet Take 12.5 mg by mouth daily.  08/09/13  Yes Historical Provider, MD  sertraline (ZOLOFT) 25 MG tablet Take 75 mg by mouth daily.   Yes Historical Provider, MD  triamcinolone cream (KENALOG) 0.1 % Apply 1 application topically 3 (three) times daily as needed (dry skin).   Yes Historical Provider, MD  vitamin C (ASCORBIC ACID) 500 MG tablet Take 500 mg by mouth  daily.    Yes Historical Provider, MD  acetaminophen (TYLENOL) 325 MG tablet Take 650 mg by mouth every 6 (six) hours as needed for moderate pain.     Historical Provider, MD  Benzocaine-Menthol 15-3.6 MG LOZG Use as directed 1 lozenge in the mouth or throat as needed (sore throat).    Historical Provider, MD  ciprofloxacin (CIPRO) 250 MG tablet Take 1 tablet (250 mg total) by mouth every 12 (twelve) hours. 07/18/15   Daleen Bo, MD  diclofenac sodium (VOLTAREN) 1 % GEL Apply 4 g topically 2 (two) times daily as needed (leg pain).     Historical Provider, MD  ondansetron (ZOFRAN-ODT) 4 MG disintegrating tablet Take 1 tablet (4 mg total) by mouth every 8 (eight) hours as needed for nausea. 06/23/14   Varney Biles, MD  polyethylene glycol (MIRALAX /  GLYCOLAX) packet Take 17 g by mouth daily as needed for mild constipation. Mix in 4 oz of water and drink    Historical Provider, MD  polyvinyl alcohol (LIQUIFILM TEARS) 1.4 % ophthalmic solution Place 2 drops into both eyes every 2 (two) hours as needed for dry eyes.    Historical Provider, MD  simethicone (MYLICON) 0000000 MG chewable tablet Chew 125 mg by mouth as needed for flatulence.     Historical Provider, MD   BP 138/51 mmHg  Pulse 60  Temp(Src) 98.4 F (36.9 C) (Oral)  Resp 13  SpO2 96% Physical Exam  Constitutional: She appears well-developed. She appears distressed (Uncomfortable).  Elderly, frail  HENT:  Head: Normocephalic and atraumatic.  Right Ear: External ear normal.  Left Ear: External ear normal.  Eyes: Conjunctivae and EOM are normal. Pupils are equal, round, and reactive to light.  Neck: Normal range of motion and phonation normal. Neck supple.  Cardiovascular: Normal rate, regular rhythm and normal heart sounds.   Pulmonary/Chest: Effort normal and breath sounds normal. She exhibits tenderness (Right lateral chest wall tenderness without crepitation or deformity.). She exhibits no bony tenderness.  Abdominal: Soft. She exhibits no distension. There is no tenderness. There is no guarding.  Musculoskeletal: Normal range of motion.  Tender bilateral knees without swelling or deformity.  Neurological: She is alert. No cranial nerve deficit or sensory deficit. She exhibits normal muscle tone. Coordination normal.  Skin: Skin is warm, dry and intact.  Psychiatric: She has a normal mood and affect. Her behavior is normal.  Nursing note and vitals reviewed.   ED Course  Procedures (including critical care time)  Initial clinical impression-old, patient demented and unable to give history. Numerous falls with ED evaluations in the last several months. Will evaluate for causative fractures as well as injuries from falling.  Medications  ciprofloxacin (CIPRO) tablet 250 mg  (250 mg Oral Given 07/18/15 1801)    No data found.   At D/C Reevaluation with update and discussion. After initial assessment and treatment, an updated evaluation reveals no change in clinical status. Chaunce Winkels L    Labs Review Labs Reviewed  URINE CULTURE - Abnormal; Notable for the following:    Culture MULTIPLE SPECIES PRESENT, SUGGEST RECOLLECTION (*)    All other components within normal limits  URINALYSIS, ROUTINE W REFLEX MICROSCOPIC (NOT AT [facility]) - Abnormal; Notable for the following:    APPearance CLOUDY (*)    Hgb urine dipstick SMALL (*)    Bilirubin Urine SMALL (*)    Leukocytes, UA MODERATE (*)    All other components within normal limits  BASIC METABOLIC PANEL - Abnormal; Notable for the  following:    Chloride 100 (*)    Creatinine, Ser 1.11 (*)    Calcium 8.8 (*)    GFR calc non Af Amer 41 (*)    GFR calc Af Amer 47 (*)    All other components within normal limits  CBC WITH DIFFERENTIAL/PLATELET - Abnormal; Notable for the following:    Hemoglobin 11.0 (*)    HCT 33.4 (*)    MCV 71.8 (*)    MCH 23.7 (*)    All other components within normal limits  URINE MICROSCOPIC-ADD ON - Abnormal; Notable for the following:    Squamous Epithelial / LPF 6-30 (*)    Bacteria, UA MANY (*)    All other components within normal limits    Imaging Review No results found. I have personally reviewed and evaluated these images and lab results as part of my medical decision-making.   EKG Interpretation None      MDM   Final diagnoses:  Fall, initial encounter  Urinary tract infection without hematuria, site unspecified  Osteoarthritis, unspecified osteoarthritis type, unspecified site    Repeated falls in elderly demented patient. Suspect UTI. No serious injury.   Nursing Notes Reviewed/ Care Coordinated Applicable Imaging Reviewed Interpretation of Laboratory Data incorporated into ED treatment  The patient appears reasonably screened and/or stabilized for  discharge and I doubt any other medical condition or other [facility] requiring further screening, evaluation, or treatment in the ED at this time prior to discharge.  Plan: Home Medications- Cipro; Home Treatments- rest, up with assistance; return here if the recommended treatment, does not improve the symptoms; Recommended follow up- PCP 1 week for check up      Daleen Bo, MD 07/20/15 1806

## 2015-07-18 NOTE — ED Notes (Signed)
Patient transported to X-ray 

## 2015-07-18 NOTE — Progress Notes (Signed)
Patient noted to have been seen in the ED 8 times within the last six months.  Patient presenting to ED post fall.  Per chart review, patient with frequent falls.  Patient is noted to be from [facility] facility.  Patient's pcp as noted on documentation from nursing facility is Dr. Sande Brothers.  System updated.

## 2015-07-18 NOTE — Discharge Instructions (Signed)
Urinary Tract Infection Urinary tract infections (UTIs) can develop anywhere along your urinary tract. Your urinary tract is your body's drainage system for removing wastes and extra water. Your urinary tract includes two kidneys, two ureters, a bladder, and a urethra. Your kidneys are a pair of bean-shaped organs. Each kidney is about the size of your fist. They are located below your ribs, one on each side of your spine. CAUSES Infections are caused by microbes, which are microscopic organisms, including fungi, viruses, and bacteria. These organisms are so small that they can only be seen through a microscope. Bacteria are the microbes that most commonly cause UTIs. SYMPTOMS  Symptoms of UTIs may vary by age and gender of the patient and by the location of the infection. Symptoms in young women typically include a frequent and intense urge to urinate and a painful, burning feeling in the bladder or urethra during urination. Older women and men are more likely to be tired, shaky, and weak and have muscle aches and abdominal pain. A fever may mean the infection is in your kidneys. Other symptoms of a kidney infection include pain in your back or sides below the ribs, nausea, and vomiting. DIAGNOSIS To diagnose a UTI, your caregiver will ask you about your symptoms. Your caregiver will also ask you to provide a urine sample. The urine sample will be tested for bacteria and white blood cells. White blood cells are made by your body to help fight infection. TREATMENT  Typically, UTIs can be treated with medication. Because most UTIs are caused by a bacterial infection, they usually can be treated with the use of antibiotics. The choice of antibiotic and length of treatment depend on your symptoms and the type of bacteria causing your infection. HOME CARE INSTRUCTIONS  If you were prescribed antibiotics, take them exactly as your caregiver instructs you. Finish the medication even if you feel better after  you have only taken some of the medication.  Drink enough water and fluids to keep your urine clear or pale yellow.  Avoid caffeine, tea, and carbonated beverages. They tend to irritate your bladder.  Empty your bladder often. Avoid holding urine for long periods of time.  Empty your bladder before and after sexual intercourse.  After a bowel movement, women should cleanse from front to back. Use each tissue only once. SEEK MEDICAL CARE IF:   You have back pain.  You develop a fever.  Your symptoms do not begin to resolve within 3 days. SEEK IMMEDIATE MEDICAL CARE IF:   You have severe back pain or lower abdominal pain.  You develop chills.  You have nausea or vomiting.  You have continued burning or discomfort with urination. MAKE SURE YOU:   Understand these instructions.  Will watch your condition.  Will get help right away if you are not doing well or get worse.   This information is not intended to replace advice given to you by your health care provider. Make sure you discuss any questions you have with your health care provider.   Document Released: 11/14/2004 Document Revised: 10/26/2014 Document Reviewed: 03/15/2011 Elsevier Interactive Patient Education 2016 [place] is a term that is commonly used to refer to joint pain or joint disease. There are more than 100 types of arthritis. CAUSES The most common cause of this condition is wear and tear of a joint. Other causes include:  Gout.  Inflammation of a joint.  An infection of a joint.  Sprains and other  injuries near the joint.  A drug reaction or allergic reaction. In some cases, the cause may not be known. SYMPTOMS The main symptom of this condition is pain in the joint with movement. Other symptoms include:  Redness, swelling, or stiffness at a joint.  Warmth coming from the joint.  Fever.  Overall feeling of illness. DIAGNOSIS This condition may be diagnosed  with a physical exam and tests, including:  Blood tests.  Urine tests.  Imaging tests, such as MRI, X-rays, or a CT scan. Sometimes, fluid is removed from a joint for testing. TREATMENT Treatment for this condition may involve:  Treatment of the cause, if it is known.  Rest.  Raising (elevating) the joint.  Applying cold or hot packs to the joint.  Medicines to improve symptoms and reduce inflammation.  Injections of a steroid such as cortisone into the joint to help reduce pain and inflammation. Depending on the cause of your arthritis, you may need to make lifestyle changes to reduce stress on your joint. These changes may include exercising more and losing weight. HOME CARE INSTRUCTIONS Medicines  Take over-the-counter and prescription medicines only as told by your health care provider.  Do not take aspirin to relieve pain if gout is suspected. Activities  Rest your joint if told by your health care provider. Rest is important when your disease is active and your joint feels painful, swollen, or stiff.  Avoid activities that make the pain worse. It is important to balance activity with rest.  Exercise your joint regularly with range-of-motion exercises as told by your health care provider. Try doing low-impact exercise, such as:  Swimming.  Water aerobics.  Biking.  Walking. Joint Care  If your joint is swollen, keep it elevated if told by your health care provider.  If your joint feels stiff in the morning, try taking a warm shower.  If directed, apply heat to the joint. If you have diabetes, do not apply heat without permission from your health care provider.  Put a towel between the joint and the hot pack or heating pad.  Leave the heat on the area for 20-30 minutes.  If directed, apply ice to the joint:  Put ice in a plastic bag.  Place a towel between your skin and the bag.  Leave the ice on for 20 minutes, 2-3 times per day.  Keep all  follow-up visits as told by your health care provider. This is important. SEEK MEDICAL CARE IF:  The pain gets worse.  You have a fever. SEEK IMMEDIATE MEDICAL CARE IF:  You develop severe joint pain, swelling, or redness.  Many joints become painful and swollen.  You develop severe back pain.  You develop severe weakness in your leg.  You cannot control your bladder or bowels.   This information is not intended to replace advice given to you by your health care provider. Make sure you discuss any questions you have with your health care provider.   Document Released: 03/14/2004 Document Revised: 10/26/2014 Document Reviewed: 05/02/2014 Elsevier Interactive Patient Education [employer].

## 2015-07-18 NOTE — ED Notes (Addendum)
Per EMS patient comes from [facility] for unwitnessed fall, was found on floor.  Patient has dementia and has been nonverbal with EMS.  Patient in NAD and when asked if she has pain rubs over left arm and left leg.

## 2015-07-18 NOTE — ED Notes (Signed)
Bed: [facility]ALB Expected date:  Expected time:  Means of arrival:  Comments: 

## 2015-07-19 LAB — URINE CULTURE

## 2015-07-20 ENCOUNTER — Encounter (HOSPITAL_COMMUNITY): Payer: Self-pay | Admitting: Emergency Medicine

## 2015-07-20 ENCOUNTER — Emergency Department (HOSPITAL_COMMUNITY)
Admission: EM | Admit: 2015-07-20 | Discharge: 2015-07-21 | Disposition: A | Discharge status: Home / Self Care | Payer: Medicare Other | Attending: Emergency Medicine | Admitting: Emergency Medicine

## 2015-07-20 ENCOUNTER — Emergency Department (HOSPITAL_COMMUNITY): Payer: Medicare Other

## 2015-07-20 DIAGNOSIS — N184 Chronic kidney disease, stage 4 (severe): Secondary | ICD-10-CM | POA: Diagnosis not present

## 2015-07-20 DIAGNOSIS — W1809XA Striking against other object with subsequent fall, initial encounter: Secondary | ICD-10-CM | POA: Diagnosis not present

## 2015-07-20 DIAGNOSIS — G309 Alzheimer's disease, unspecified: Secondary | ICD-10-CM | POA: Insufficient documentation

## 2015-07-20 DIAGNOSIS — W19XXXA Unspecified fall, initial encounter: Secondary | ICD-10-CM

## 2015-07-20 DIAGNOSIS — M1990 Unspecified osteoarthritis, unspecified site: Secondary | ICD-10-CM | POA: Insufficient documentation

## 2015-07-20 DIAGNOSIS — Z79899 Other long term (current) drug therapy: Secondary | ICD-10-CM | POA: Insufficient documentation

## 2015-07-20 DIAGNOSIS — Y92129 Unspecified place in nursing home as the place of occurrence of the external cause: Secondary | ICD-10-CM | POA: Diagnosis not present

## 2015-07-20 DIAGNOSIS — I129 Hypertensive chronic kidney disease with stage 1 through stage 4 chronic kidney disease, or unspecified chronic kidney disease: Secondary | ICD-10-CM | POA: Diagnosis not present

## 2015-07-20 DIAGNOSIS — E119 Type 2 diabetes mellitus without complications: Secondary | ICD-10-CM | POA: Diagnosis not present

## 2015-07-20 DIAGNOSIS — Y999 Unspecified external cause status: Secondary | ICD-10-CM | POA: Insufficient documentation

## 2015-07-20 DIAGNOSIS — Y9389 Activity, other specified: Secondary | ICD-10-CM | POA: Diagnosis not present

## 2015-07-20 DIAGNOSIS — Z7982 Long term (current) use of aspirin: Secondary | ICD-10-CM | POA: Insufficient documentation

## 2015-07-20 DIAGNOSIS — M25562 Pain in left knee: Secondary | ICD-10-CM | POA: Diagnosis present

## 2015-07-20 NOTE — ED Notes (Signed)
Pt comes to Ed via ems, witnessed fall at facility, [facility]. Pt was tiring to pull the fire alarm, and hit her head on wall. No presence of hematoma, no oblivious injury and trama visualized. Pt has hx of dementia and at baseline she can talk and questions, but is currently non verbalized. Ems iv attempted, pt pulled on it not successful. Vs on arrival 150/70, hr 84, 173 cbg, sinus rhythm on monitor. spo2 100 room air, rr 18.

## 2015-07-20 NOTE — ED Notes (Signed)
Bed: WA07 Expected date:  Expected time:  Means of arrival:  Comments: EMS 82 yo female from [place] hit head-no obvious head trauma

## 2015-07-20 NOTE — ED Provider Notes (Signed)
History  By signing my name below, I, Bea Graff, attest that this documentation has been prepared under the direction and in the presence of [employer], PA-C. Electronically Signed: Bea Graff, ED Scribe. 07/20/2015. 10:41 PM.  Chief Complaint  Patient presents with  . Fall   The history is provided by medical records, the EMS personnel and the nursing home. No language interpreter was used.    LEVEL 5 CAVEAT- Full history could not be obtained due to dementia.  HPI Comments:  [name] Ramos is a 82 y.o. female with PMHx of Alzheimer's disease, DM, arthritis, CKD and chronic pain brought in by EMS, who presents to the Emergency Department complaining of a fall sustained PTA. EMS states the pt had a witnessed fall at the facility she lives, [facility]. She reports some upper right leg pain and a HA. She reports pain when bending her left knee as well. She has not been given anything for pain. She is unable to verbalize any modifying factors. Facility denies LOC.    Past Medical History  Diagnosis Date  . Alzheimer disease   . Hypertension   . Diabetes mellitus without complication ([place])   . Arthritis   . Hearing impaired   . Cataracts, bilateral   . Recurrent falls   . Hemorrhoids   . Fecal impaction ([place]) 12/15/2012  . Protein-calorie malnutrition, severe ([place]) 12/16/2012  . Underweight 12/17/2012  . Anxiety disorder   . Chronic pain   . Heart block   . Peripheral vascular disease ([place])   . Chronic kidney disease, stage IV (severe) 12/14/2012   Past Surgical History  Procedure Laterality Date  . Tonsillectomy    . Appendectomy    . Cataract extraction, bilateral     Family History  Problem Relation Age of Onset  . Colon cancer Neg Hx   . Breast cancer      multiple  . Dementia Sister   . High blood pressure      multiple  . Diabetes      multiple   Social History  Substance Use Topics  . Smoking status: Never Smoker   . Smokeless tobacco:  Never Used  . Alcohol Use: No   OB History    No data available      Review of Systems  Unable to perform ROS: Dementia  LEVEL 5 CAVEAT- Full history could not be obtained due to dementia.   Allergies  Ace inhibitors; Codeine; Penicillins; Sulfa antibiotics; and Tramadol  Home Medications   Prior to Admission medications   Medication Sig Start Date End Date Taking? Authorizing Provider  amLODipine-valsartan (EXFORGE) 10-160 MG per tablet Take 1 tablet by mouth daily.    Yes Historical Provider, MD  aspirin EC 81 MG tablet Take 81 mg by mouth daily.    Yes Historical Provider, MD  cholecalciferol (VITAMIN D) 1000 UNITS tablet Take 1,000 Units by mouth daily.    Yes Historical Provider, MD  ciprofloxacin (CIPRO) 250 MG tablet Take 1 tablet (250 mg total) by mouth every 12 (twelve) hours. 07/18/15  Yes Daleen Bo, MD  divalproex (DEPAKOTE SPRINKLE) 125 MG capsule Take 125-250 mg by mouth 2 (two) times daily. Take 1 capsule in the am and Take 2 capsules in the pm   Yes Historical Provider, MD  ferrous sulfate 325 (65 FE) MG tablet Take 325 mg by mouth daily.    Yes Historical Provider, MD  lidocaine (LIDODERM) 5 % Place 1 patch onto the skin daily. Remove &  Discard patch within 12 hours or as directed by MD (apply every morning and remove every night   Yes Historical Provider, MD  LORazepam (ATIVAN) 0.5 MG tablet Take 0.5 mg by mouth 2 (two) times daily. May take an additional 0.25mg s every 6 hours as needed for agitation 08/11/13  Yes Historical Provider, MD  LORAZEPAM PO Take 1 mL by mouth every 8 (eight) hours as needed (agitation). Gel.   Yes Historical Provider, MD  metoprolol succinate (TOPROL-XL) 25 MG 24 hr tablet Take 12.5 mg by mouth daily.  08/09/13  Yes Historical Provider, MD  sertraline (ZOLOFT) 25 MG tablet Take 75 mg by mouth daily.   Yes Historical Provider, MD  vitamin C (ASCORBIC ACID) 500 MG tablet Take 500 mg by mouth daily.    Yes Historical Provider, MD   acetaminophen (TYLENOL) 325 MG tablet Take 650 mg by mouth every 6 (six) hours as needed for moderate pain.     Historical Provider, MD  Benzocaine-Menthol 15-3.6 MG LOZG Use as directed 1 lozenge in the mouth or throat as needed (sore throat).    Historical Provider, MD  diclofenac sodium (VOLTAREN) 1 % GEL Apply 4 g topically 2 (two) times daily as needed (leg pain).     Historical Provider, MD  ondansetron (ZOFRAN-ODT) 4 MG disintegrating tablet Take 1 tablet (4 mg total) by mouth every 8 (eight) hours as needed for nausea. 06/23/14   Varney Biles, MD  polyethylene glycol (MIRALAX / GLYCOLAX) packet Take 17 g by mouth daily as needed for mild constipation. Mix in 4 oz of water and drink    Historical Provider, MD  polyvinyl alcohol (LIQUIFILM TEARS) 1.4 % ophthalmic solution Place 2 drops into both eyes every 2 (two) hours as needed for dry eyes.    Historical Provider, MD  simethicone (MYLICON) 0000000 MG chewable tablet Chew 125 mg by mouth as needed for flatulence.     Historical Provider, MD  triamcinolone cream (KENALOG) 0.1 % Apply 1 application topically 3 (three) times daily as needed (dry skin).    Historical Provider, MD   Triage Vitals: BP 157/66 mmHg  Pulse 77  Temp(Src) 97.8 F (36.6 C) (Oral)  Resp 16  SpO2 100%  Physical Exam  Constitutional: She is oriented to person, place, and time. She appears well-developed and well-nourished. No distress.  Nontoxic appearing  HENT:  Head: Normocephalic and atraumatic.  No Battle sign or raccoons eyes.  Eyes: Conjunctivae and EOM are normal. No scleral icterus.  Neck:  No bony deformities, step-offs, or crepitus to the cervical midline.  Cardiovascular: Normal rate, regular rhythm and intact distal pulses.   Pulmonary/Chest: Effort normal. No respiratory distress. She has no wheezes.  Respirations even and unlabored  Musculoskeletal: Normal range of motion.  Mild shortening of the LLE appreciated. No malrotation of the BLE. Suspect  mild TTP to the left knee. Patient able to wiggle all toes.  Neurological: She is alert and oriented to person, place, and time. She exhibits normal muscle tone. Coordination normal.  Skin: Skin is warm and dry. No rash noted. She is not diaphoretic. No erythema. No pallor.  Psychiatric: She has a normal mood and affect. Her behavior is normal.  Nursing note and vitals reviewed.   ED Course  Procedures (including critical care time) DIAGNOSTIC STUDIES: Oxygen Saturation is 100% on RA, normal by my interpretation.   COORDINATION OF CARE: 10:38 PM- Will CT head and C-spine and X-Ray pelvis. Pt verbalizes understanding and agrees to plan.  Medications -  No data to display  Labs Review Labs Reviewed  URINALYSIS, ROUTINE W REFLEX MICROSCOPIC (NOT AT [facility]) - Abnormal; Notable for the following:    APPearance CLOUDY (*)    Leukocytes, UA SMALL (*)    All other components within normal limits  URINE MICROSCOPIC-ADD ON - Abnormal; Notable for the following:    Squamous Epithelial / LPF 0-5 (*)    Bacteria, UA FEW (*)    All other components within normal limits   Imaging Review Dg Pelvis 1-2 Views  07/20/2015  CLINICAL DATA:  Status post unwitnessed fall at nursing home. Concern for pelvic injury. Initial encounter. EXAM: PELVIS - 1-2 VIEW COMPARISON:  Pelvic radiograph performed 07/09/2015 FINDINGS: There is no evidence of fracture or dislocation. Both femoral heads are seated normally within their respective acetabula. Mild degenerative change is noted at the lower lumbar spine. The sacroiliac joints are unremarkable in appearance. The visualized bowel gas pattern is grossly unremarkable in appearance. IMPRESSION: No evidence of fracture or dislocation. Electronically Signed   By: Garald Balding M.D.   On: 07/20/2015 23:45   Ct Head Wo Contrast  07/20/2015  CLINICAL DATA:  Pain after fall. EXAM: CT HEAD WITHOUT CONTRAST CT CERVICAL SPINE WITHOUT CONTRAST TECHNIQUE: Multidetector CT imaging of  the head and cervical spine was performed following the standard protocol without intravenous contrast. Multiplanar CT image reconstructions of the cervical spine were also generated. COMPARISON:  Jul 18, 2015 FINDINGS: CT HEAD FINDINGS Paranasal sinuses, mastoid air cells, bones, and extracranial soft tissues are within normal limits. No subdural, epidural, or subarachnoid hemorrhage. Ventricles and sulci are prominent but stable. Cerebellum, brainstem, and basal cisterns are normal. Mild white matter changes with no acute cortical ischemia or infarct identified. No mass, mass effect, or midline shift. CT CERVICAL SPINE FINDINGS 3 mm of anterolisthesis of C3 versus C4 is stable. 2 mm of anterolisthesis of C4 versus C5 is stable as well. 1 or 2 mm of anterolisthesis of C7 versus T1 is also stable. No other malalignment. No fractures are seen. Multi-level degenerative changes with anterior osteophytes and a few small posterior osteophytes are similar in the interval. Facet degenerative changes are noted as well. Soft tissues and lung apices are unremarkable IMPRESSION: 1. No acute intracranial process. 2. Anterior listhesis at several levels in the cervical spine, stable in the interval. No acute fracture identified. Electronically Signed   By: Dorise Bullion III M.D   On: 07/20/2015 23:41   Ct Cervical Spine Wo Contrast  07/20/2015  CLINICAL DATA:  Pain after fall. EXAM: CT HEAD WITHOUT CONTRAST CT CERVICAL SPINE WITHOUT CONTRAST TECHNIQUE: Multidetector CT imaging of the head and cervical spine was performed following the standard protocol without intravenous contrast. Multiplanar CT image reconstructions of the cervical spine were also generated. COMPARISON:  Jul 18, 2015 FINDINGS: CT HEAD FINDINGS Paranasal sinuses, mastoid air cells, bones, and extracranial soft tissues are within normal limits. No subdural, epidural, or subarachnoid hemorrhage. Ventricles and sulci are prominent but stable. Cerebellum,  brainstem, and basal cisterns are normal. Mild white matter changes with no acute cortical ischemia or infarct identified. No mass, mass effect, or midline shift. CT CERVICAL SPINE FINDINGS 3 mm of anterolisthesis of C3 versus C4 is stable. 2 mm of anterolisthesis of C4 versus C5 is stable as well. 1 or 2 mm of anterolisthesis of C7 versus T1 is also stable. No other malalignment. No fractures are seen. Multi-level degenerative changes with anterior osteophytes and a few small posterior osteophytes are similar in  the interval. Facet degenerative changes are noted as well. Soft tissues and lung apices are unremarkable IMPRESSION: 1. No acute intracranial process. 2. Anterior listhesis at several levels in the cervical spine, stable in the interval. No acute fracture identified. Electronically Signed   By: Dorise Bullion III M.D   On: 07/20/2015 23:41   Dg Knee Complete 4 Views Left  07/20/2015  CLINICAL DATA:  Status post fall. Left knee pain. Initial encounter. EXAM: LEFT KNEE - COMPLETE 4+ VIEW COMPARISON:  Left knee radiographs performed 07/18/2015 FINDINGS: There is no evidence of fracture or dislocation. The joint spaces are preserved. Mild osteophyte formation is noted at the tibial spine. No significant joint effusion is seen. The visualized soft tissues are normal in appearance. IMPRESSION: No evidence of fracture or dislocation. Electronically Signed   By: Garald Balding M.D.   On: 07/20/2015 23:44     I have personally reviewed and evaluated these images and lab results as part of my medical decision-making.   EKG Interpretation None      MDM   Final diagnoses:  Fall, initial encounter    82 year old female with a history of dementia presents to the emergency department after an alleged fall into the wall while the patient was trying to pull the fire alarm. No obvious injury noted on exam. Patient in no distress. She is afebrile with stable vital signs. Imaging performed today which is  negative. Urinalysis does not suggest infection; this appears to be resolving from prior visit.  No indication for further emergent workup. Patient stable for discharge back to her nursing facility.  I personally performed the services described in this documentation, which was scribed in my presence. The recorded information has been reviewed and is accurate.    Filed Vitals:   07/20/15 2204 07/20/15 2342  BP: 157/66 154/64  Pulse: 77 72  Temp: 97.8 F (36.6 C) 98.2 F (36.8 C)  TempSrc: Oral Oral  Resp: 16 16  SpO2: 100% 100%       Antonietta Breach, PA-C 07/21/15 [phone]

## 2015-07-20 NOTE — ED Provider Notes (Signed)
Medical screening examination/treatment/procedure(s) were conducted as a shared visit with non-physician practitioner(s) and myself.  I personally evaluated the patient during the encounter.   EKG Interpretation None     Patient here after witnessed fall at nursing home where she was reaching for a fire alarm in struck her head. No loss of consciousness. Will obtain imaging as well as check urinalysis and anticipate that she will be discharged back to the nursing home  Lacretia Leigh, MD 07/20/15 2337

## 2015-07-21 LAB — URINALYSIS, ROUTINE W REFLEX MICROSCOPIC
Bilirubin Urine: NEGATIVE
Glucose, UA: NEGATIVE mg/dL
HGB URINE DIPSTICK: NEGATIVE
Ketones, ur: NEGATIVE mg/dL
Nitrite: NEGATIVE
PH: 5 (ref 5.0–8.0)
Protein, ur: NEGATIVE mg/dL
SPECIFIC GRAVITY, URINE: 1.01 (ref 1.005–1.030)

## 2015-07-21 LAB — URINE MICROSCOPIC-ADD ON: RBC / HPF: NONE SEEN RBC/hpf (ref 0–5)

## 2015-07-21 NOTE — Discharge Instructions (Signed)
Fall Prevention in the Home  Falls can cause injuries and can affect people from all age groups. There are many simple things that you can do to make your home safe and to help prevent falls. WHAT CAN I DO ON THE OUTSIDE OF MY HOME?  Regularly repair the edges of walkways and driveways and fix any cracks.  Remove high doorway thresholds.  Trim any shrubbery on the main path into your home.  Use bright outdoor lighting.  Clear walkways of debris and clutter, including tools and rocks.  Regularly check that handrails are securely fastened and in good repair. Both sides of any steps should have handrails.  Install guardrails along the edges of any raised decks or porches.  Have leaves, snow, and ice cleared regularly.  Use sand or salt on walkways during winter months.  In the garage, clean up any spills right away, including grease or oil spills. WHAT CAN I DO IN THE BATHROOM?  Use night lights.  Install grab bars by the toilet and in the tub and shower. Do not use towel bars as grab bars.  Use non-skid mats or decals on the floor of the tub or shower.  If you need to sit down while you are in the shower, use a plastic, non-slip stool..  Keep the floor dry. Immediately clean up any water that spills on the floor.  Remove soap buildup in the tub or shower on a regular basis.  Attach bath mats securely with double-sided non-slip rug tape.  Remove throw rugs and other tripping hazards from the floor. WHAT CAN I DO IN THE BEDROOM?  Use night lights.  Make sure that a bedside light is easy to reach.  Do not use oversized bedding that drapes onto the floor.  Have a firm chair that has side arms to use for getting dressed.  Remove throw rugs and other tripping hazards from the floor. WHAT CAN I DO IN THE KITCHEN?   Clean up any spills right away.  Avoid walking on wet floors.  Place frequently used items in easy-to-reach places.  If you need to reach for something  above you, use a sturdy step stool that has a grab bar.  Keep electrical cables out of the way.  Do not use floor polish or wax that makes floors slippery. If you have to use wax, make sure that it is non-skid floor wax.  Remove throw rugs and other tripping hazards from the floor. WHAT CAN I DO IN THE STAIRWAYS?  Do not leave any items on the stairs.  Make sure that there are handrails on both sides of the stairs. Fix handrails that are broken or loose. Make sure that handrails are as long as the stairways.  Check any carpeting to make sure that it is firmly attached to the stairs. Fix any carpet that is loose or worn.  Avoid having throw rugs at the top or bottom of stairways, or secure the rugs with carpet tape to prevent them from moving.  Make sure that you have a light switch at the top of the stairs and the bottom of the stairs. If you do not have them, have them installed. WHAT ARE SOME OTHER FALL PREVENTION TIPS?  Wear closed-toe shoes that fit well and support your feet. Wear shoes that have rubber soles or low heels.  When you use a stepladder, make sure that it is completely opened and that the sides are firmly locked. Have someone hold the ladder while you   are using it. Do not climb a closed stepladder.  Add color or contrast paint or tape to grab bars and handrails in your home. Place contrasting color strips on the first and last steps.  Use mobility aids as needed, such as canes, walkers, scooters, and crutches.  Turn on lights if it is dark. Replace any light bulbs that burn out.  Set up furniture so that there are clear paths. Keep the furniture in the same spot.  Fix any uneven floor surfaces.  Choose a carpet design that does not hide the edge of steps of a stairway.  Be aware of any and all pets.  Review your medicines with your healthcare provider. Some medicines can cause dizziness or changes in blood pressure, which increase your risk of falling. Talk  with your health care provider about other ways that you can decrease your risk of falls. This may include working with a physical therapist or trainer to improve your strength, balance, and endurance.   This information is not intended to replace advice given to you by your health care provider. Make sure you discuss any questions you have with your health care provider.   Document Released: 01/25/2002 Document Revised: 06/21/2014 Document Reviewed: 03/11/2014 Elsevier Interactive Patient Education 2016 [employer]  

## 2015-07-25 ENCOUNTER — Encounter (HOSPITAL_COMMUNITY): Payer: Self-pay

## 2015-07-25 ENCOUNTER — Emergency Department (HOSPITAL_COMMUNITY): Payer: Medicare Other

## 2015-07-25 ENCOUNTER — Emergency Department (HOSPITAL_COMMUNITY)
Admission: EM | Admit: 2015-07-25 | Discharge: 2015-07-25 | Disposition: A | Discharge status: Home / Self Care | Payer: Medicare Other | Attending: Emergency Medicine | Admitting: Emergency Medicine

## 2015-07-25 DIAGNOSIS — N184 Chronic kidney disease, stage 4 (severe): Secondary | ICD-10-CM | POA: Diagnosis not present

## 2015-07-25 DIAGNOSIS — Y929 Unspecified place or not applicable: Secondary | ICD-10-CM | POA: Insufficient documentation

## 2015-07-25 DIAGNOSIS — E1122 Type 2 diabetes mellitus with diabetic chronic kidney disease: Secondary | ICD-10-CM | POA: Diagnosis not present

## 2015-07-25 DIAGNOSIS — I129 Hypertensive chronic kidney disease with stage 1 through stage 4 chronic kidney disease, or unspecified chronic kidney disease: Secondary | ICD-10-CM | POA: Diagnosis not present

## 2015-07-25 DIAGNOSIS — W19XXXA Unspecified fall, initial encounter: Secondary | ICD-10-CM | POA: Insufficient documentation

## 2015-07-25 DIAGNOSIS — Z791 Long term (current) use of non-steroidal anti-inflammatories (NSAID): Secondary | ICD-10-CM | POA: Diagnosis not present

## 2015-07-25 DIAGNOSIS — Y999 Unspecified external cause status: Secondary | ICD-10-CM | POA: Insufficient documentation

## 2015-07-25 DIAGNOSIS — Z79899 Other long term (current) drug therapy: Secondary | ICD-10-CM | POA: Insufficient documentation

## 2015-07-25 DIAGNOSIS — G300 Alzheimer's disease with early onset: Secondary | ICD-10-CM | POA: Diagnosis not present

## 2015-07-25 DIAGNOSIS — Z7982 Long term (current) use of aspirin: Secondary | ICD-10-CM | POA: Diagnosis not present

## 2015-07-25 DIAGNOSIS — Y92129 Unspecified place in nursing home as the place of occurrence of the external cause: Secondary | ICD-10-CM

## 2015-07-25 DIAGNOSIS — Y939 Activity, unspecified: Secondary | ICD-10-CM | POA: Diagnosis not present

## 2015-07-25 DIAGNOSIS — S79911A Unspecified injury of right hip, initial encounter: Secondary | ICD-10-CM | POA: Insufficient documentation

## 2015-07-25 NOTE — ED Notes (Signed)
Bed: WA13 Expected date:  Expected time:  Means of arrival:  Comments: EMS 

## 2015-07-25 NOTE — ED Notes (Signed)
Pt had unwitnessed fall at facility, pt has dementia is at baseline but shows obvious pain when manipulating right hip

## 2015-07-25 NOTE — ED Provider Notes (Signed)
CSN: [id number]     Arrival date & time 07/25/15  [id number] History   By signing my name below, I, Terrance Branch, attest that this documentation has been prepared under the direction and in the presence of Shanon Rosser, MD. Electronically Signed: Randa Evens, ED Scribe. 07/25/2015. 3:54 AM.     Chief Complaint  Patient presents with  . Hip Injury    The history is provided by the EMS personnel. No language interpreter was used.   HPI Comments: Level 5 caveat due to dementia  [name] Ramos is a 82 y.o. female brought in by ambulance from nursing facility, who presents to the Emergency Department for unwitnessed fall 30 minutes PTA. Per ems pt was found lying on her right side and appears to wince in pain with the movement of right hip. Right leg does does not appear shortened or rotated. No scalp hematoma was noted.  Past Medical History  Diagnosis Date  . Alzheimer disease   . Hypertension   . Diabetes mellitus without complication ([place])   . Arthritis   . Hearing impaired   . Cataracts, bilateral   . Recurrent falls   . Hemorrhoids   . Fecal impaction ([place]) 12/15/2012  . Protein-calorie malnutrition, severe ([place]) 12/16/2012  . Underweight 12/17/2012  . Anxiety disorder   . Chronic pain   . Heart block   . Peripheral vascular disease ([place])   . Chronic kidney disease, stage IV (severe) 12/14/2012   Past Surgical History  Procedure Laterality Date  . Tonsillectomy    . Appendectomy    . Cataract extraction, bilateral     Family History  Problem Relation Age of Onset  . Colon cancer Neg Hx   . Breast cancer      multiple  . Dementia Sister   . High blood pressure      multiple  . Diabetes      multiple   Social History  Substance Use Topics  . Smoking status: Never Smoker   . Smokeless tobacco: Never Used  . Alcohol Use: No   OB History    No data available      Review of Systems  Unable to perform ROS: Dementia     Allergies  Ace inhibitors; Codeine;  Penicillins; Sulfa antibiotics; and Tramadol  Home Medications   Prior to Admission medications   Medication Sig Start Date End Date Taking? Authorizing Provider  acetaminophen (TYLENOL) 325 MG tablet Take 650 mg by mouth every 6 (six) hours as needed for moderate pain.     Historical Provider, MD  amLODipine-valsartan (EXFORGE) 10-160 MG per tablet Take 1 tablet by mouth daily.     Historical Provider, MD  aspirin EC 81 MG tablet Take 81 mg by mouth daily.     Historical Provider, MD  Benzocaine-Menthol 15-3.6 MG LOZG Use as directed 1 lozenge in the mouth or throat as needed (sore throat).    Historical Provider, MD  cholecalciferol (VITAMIN D) 1000 UNITS tablet Take 1,000 Units by mouth daily.     Historical Provider, MD  ciprofloxacin (CIPRO) 250 MG tablet Take 1 tablet (250 mg total) by mouth every 12 (twelve) hours. 07/18/15   Daleen Bo, MD  diclofenac sodium (VOLTAREN) 1 % GEL Apply 4 g topically 2 (two) times daily as needed (leg pain).     Historical Provider, MD  divalproex (DEPAKOTE SPRINKLE) 125 MG capsule Take 125-250 mg by mouth 2 (two) times daily. Take 1 capsule in the am and Take 2 capsules  in the pm    Historical Provider, MD  ferrous sulfate 325 (65 FE) MG tablet Take 325 mg by mouth daily.     Historical Provider, MD  lidocaine (LIDODERM) 5 % Place 1 patch onto the skin daily. Remove & Discard patch within 12 hours or as directed by MD (apply every morning and remove every night    Historical Provider, MD  LORazepam (ATIVAN) 0.5 MG tablet Take 0.5 mg by mouth 2 (two) times daily. May take an additional 0.25mg s every 6 hours as needed for agitation 08/11/13   Historical Provider, MD  LORAZEPAM PO Take 1 mL by mouth every 8 (eight) hours as needed (agitation). Gel.    Historical Provider, MD  metoprolol succinate (TOPROL-XL) 25 MG 24 hr tablet Take 12.5 mg by mouth daily.  08/09/13   Historical Provider, MD  ondansetron (ZOFRAN-ODT) 4 MG disintegrating tablet Take 1 tablet (4 mg  total) by mouth every 8 (eight) hours as needed for nausea. 06/23/14   Varney Biles, MD  polyethylene glycol (MIRALAX / GLYCOLAX) packet Take 17 g by mouth daily as needed for mild constipation. Mix in 4 oz of water and drink    Historical Provider, MD  polyvinyl alcohol (LIQUIFILM TEARS) 1.4 % ophthalmic solution Place 2 drops into both eyes every 2 (two) hours as needed for dry eyes.    Historical Provider, MD  sertraline (ZOLOFT) 25 MG tablet Take 75 mg by mouth daily.    Historical Provider, MD  simethicone (MYLICON) 0000000 MG chewable tablet Chew 125 mg by mouth as needed for flatulence.     Historical Provider, MD  triamcinolone cream (KENALOG) 0.1 % Apply 1 application topically 3 (three) times daily as needed (dry skin).    Historical Provider, MD  vitamin C (ASCORBIC ACID) 500 MG tablet Take 500 mg by mouth daily.     Historical Provider, MD   BP 165/50 mmHg  Pulse 71  Temp(Src) 97.3 F (36.3 C) (Oral)  Resp 20  SpO2 100%   Physical Exam General: Well-developed, cachectic female in no acute distress; appearance consistent with age of record HENT: normocephalic; atraumatic Eyes: pupils equal, round and reactive to light Neck: supple but range of motion limited due to degenerative changes; C-spine non ntender Heart: regular rate and rhythm; no murmurs, rubs or gallops Lungs: clear to auscultation bilaterally Abdomen: soft; nondistended; nontender; no masses or hepatosplenomegaly; bowel sounds present Extremities: Arthritic changes; no bony point tenderness; pulses normal; no edema Neurologic: Awake, alert; non verbal; does not follow commands; noted to move all extremities; no facial droop Skin: Warm and dry; lipoma left elbow.   ED Course  Procedures (including critical care time)   MDM  Nursing notes and vitals signs, including pulse oximetry, reviewed.  Summary of this visit's results, reviewed by myself:  Imaging Studies: Dg Hip Unilat With Pelvis 2-3 Views  Right  07/25/2015  CLINICAL DATA:  82 year old female with fall and right hip pain. EXAM: DG HIP (WITH OR WITHOUT PELVIS) 2-3V RIGHT COMPARISON:  Radiograph dated 04/10/2015 FINDINGS: There is no acute fracture or dislocation. The bones are osteopenic which limits evaluation for fracture. There is mild osteoarthritic changes of the hip. There are degenerative changes of the lower lumbar spine. The soft tissues are grossly unremarkable. IMPRESSION: No acute fracture or dislocation. Electronically Signed   By: Anner Crete M.D.   On: 07/25/2015 03:44   4:04 AM On reassessment the patient does not exhibit any focal tenderness or pain on attempted range of motion  of her shoulders or hips. She has been noted to make purposeful movements with both upper extremities without difficulty.  I personally performed the services described in this documentation, which was scribed in my presence. The recorded information has been reviewed and is accurate.    Shanon Rosser, MD 07/25/15 [phone]

## 2015-08-06 ENCOUNTER — Emergency Department (HOSPITAL_COMMUNITY)
Admission: EM | Admit: 2015-08-06 | Discharge: 2015-08-06 | Disposition: A | Discharge status: Home / Self Care | Payer: Medicare Other | Attending: Emergency Medicine | Admitting: Emergency Medicine

## 2015-08-06 ENCOUNTER — Emergency Department (HOSPITAL_COMMUNITY): Payer: Medicare Other

## 2015-08-06 ENCOUNTER — Encounter (HOSPITAL_COMMUNITY): Payer: Self-pay | Admitting: Emergency Medicine

## 2015-08-06 DIAGNOSIS — M1990 Unspecified osteoarthritis, unspecified site: Secondary | ICD-10-CM | POA: Diagnosis not present

## 2015-08-06 DIAGNOSIS — G309 Alzheimer's disease, unspecified: Secondary | ICD-10-CM | POA: Diagnosis not present

## 2015-08-06 DIAGNOSIS — N184 Chronic kidney disease, stage 4 (severe): Secondary | ICD-10-CM | POA: Insufficient documentation

## 2015-08-06 DIAGNOSIS — E1122 Type 2 diabetes mellitus with diabetic chronic kidney disease: Secondary | ICD-10-CM | POA: Insufficient documentation

## 2015-08-06 DIAGNOSIS — E1151 Type 2 diabetes mellitus with diabetic peripheral angiopathy without gangrene: Secondary | ICD-10-CM | POA: Diagnosis not present

## 2015-08-06 DIAGNOSIS — W19XXXA Unspecified fall, initial encounter: Secondary | ICD-10-CM

## 2015-08-06 DIAGNOSIS — I129 Hypertensive chronic kidney disease with stage 1 through stage 4 chronic kidney disease, or unspecified chronic kidney disease: Secondary | ICD-10-CM | POA: Insufficient documentation

## 2015-08-06 DIAGNOSIS — Z043 Encounter for examination and observation following other accident: Secondary | ICD-10-CM | POA: Insufficient documentation

## 2015-08-06 NOTE — ED Notes (Signed)
Per EMS pt had an unwitnessed fall uncertain of location from [facility]. Towel roll applied as spinal immobilization by EMS. Pt not reporting any pain or injury at this time.

## 2015-08-06 NOTE — ED Provider Notes (Signed)
Medical screening examination/treatment/procedure(s) were conducted as a shared visit with non-physician practitioner(s) and myself.  I personally evaluated the patient during the encounter.   EKG Interpretation None     Patient here after unwitnessed fall at nursing home. No visible signs of trauma. Some pain with movement of her hips. Will x-ray and likely discharge home.  Lacretia Leigh, MD 08/06/15 [phone]

## 2015-08-06 NOTE — ED Provider Notes (Signed)
CSN: [id number]     Arrival date & time 08/06/15  0558 History   First MD Initiated Contact with Patient 08/06/15 0700     Chief Complaint  Patient presents with  . Fall   Level V caveat: Dementia  HPI   [name] Ramos is a 82 y.o. female PMH significant for recurrent falls, Alzheimer's, hypertension, diabetes, CKD stage IV presenting status post unwitnessed fall at [facility]. Patient nonverbal and has baseline dementia.     Past Medical History  Diagnosis Date  . Alzheimer disease   . Hypertension   . Diabetes mellitus without complication ([place])   . Arthritis   . Hearing impaired   . Cataracts, bilateral   . Recurrent falls   . Hemorrhoids   . Fecal impaction ([place]) 12/15/2012  . Protein-calorie malnutrition, severe ([place]) 12/16/2012  . Underweight 12/17/2012  . Anxiety disorder   . Chronic pain   . Heart block   . Peripheral vascular disease ([place])   . Chronic kidney disease, stage IV (severe) 12/14/2012   Past Surgical History  Procedure Laterality Date  . Tonsillectomy    . Appendectomy    . Cataract extraction, bilateral     Family History  Problem Relation Age of Onset  . Colon cancer Neg Hx   . Breast cancer      multiple  . Dementia Sister   . High blood pressure      multiple  . Diabetes      multiple   Social History  Substance Use Topics  . Smoking status: Never Smoker   . Smokeless tobacco: Never Used  . Alcohol Use: No   OB History    No data available     Review of Systems  Unable to perform ROS: Dementia   Allergies  Ace inhibitors; Codeine; Penicillins; Sulfa antibiotics; and Tramadol  Home Medications   Prior to Admission medications   Medication Sig Start Date End Date Taking? Authorizing Provider  acetaminophen (TYLENOL) 325 MG tablet Take 650 mg by mouth every 6 (six) hours as needed for moderate pain.     Historical Provider, MD  amLODipine-valsartan (EXFORGE) 10-160 MG per tablet Take 1 tablet by mouth daily.      Historical Provider, MD  aspirin EC 81 MG tablet Take 81 mg by mouth daily.     Historical Provider, MD  cholecalciferol (VITAMIN D) 1000 UNITS tablet Take 1,000 Units by mouth daily.     Historical Provider, MD  ciprofloxacin (CIPRO) 250 MG tablet Take 1 tablet (250 mg total) by mouth every 12 (twelve) hours. 07/18/15   Daleen Bo, MD  diclofenac sodium (VOLTAREN) 1 % GEL Apply 4 g topically 2 (two) times daily as needed (leg pain).     Historical Provider, MD  divalproex (DEPAKOTE SPRINKLE) 125 MG capsule Take 125-250 mg by mouth 2 (two) times daily. Take 1 capsule in the am and Take 2 capsules in the pm    Historical Provider, MD  ferrous sulfate 325 (65 FE) MG tablet Take 325 mg by mouth daily.     Historical Provider, MD  lidocaine (LIDODERM) 5 % Place 1 patch onto the skin daily. Remove & Discard patch within 12 hours or as directed by MD (apply every morning and remove every night    Historical Provider, MD  LORazepam (ATIVAN) 0.5 MG tablet Take 0.5 mg by mouth 2 (two) times daily. May take an additional 0.25mg s every 6 hours as needed for agitation 08/11/13   Historical Provider, MD  LORazepam in dextrose solution Take 0.5 mg by mouth every 8 (eight) hours as needed (agitation).    Historical Provider, MD  metoprolol succinate (TOPROL-XL) 25 MG 24 hr tablet Take 12.5 mg by mouth daily.  08/09/13   Historical Provider, MD  ondansetron (ZOFRAN-ODT) 4 MG disintegrating tablet Take 1 tablet (4 mg total) by mouth every 8 (eight) hours as needed for nausea. 06/23/14   Varney Biles, MD  polyethylene glycol (MIRALAX / GLYCOLAX) packet Take 17 g by mouth daily as needed for mild constipation. Mix in 4 oz of water and drink    Historical Provider, MD  polyvinyl alcohol (LIQUIFILM TEARS) 1.4 % ophthalmic solution Place 2 drops into both eyes every 2 (two) hours as needed for dry eyes.    Historical Provider, MD  sertraline (ZOLOFT) 25 MG tablet Take 75 mg by mouth daily.    Historical Provider, MD   simethicone (MYLICON) 0000000 MG chewable tablet Chew 125 mg by mouth as needed for flatulence.     Historical Provider, MD  triamcinolone cream (KENALOG) 0.1 % Apply 1 application topically 3 (three) times daily as needed (dry skin).    Historical Provider, MD  vitamin C (ASCORBIC ACID) 500 MG tablet Take 500 mg by mouth daily.     Historical Provider, MD   BP 183/58 mmHg  Pulse 73  Temp(Src) 98 F (36.7 C) (Axillary)  Resp 17  SpO2 100% Physical Exam  Constitutional: She appears well-developed and well-nourished. No distress.  HENT:  Head: Normocephalic and atraumatic.  Mouth/Throat: Oropharynx is clear and moist. No oropharyngeal exudate.  No hemotympanum  Eyes: Conjunctivae are normal. Right eye exhibits no discharge. Left eye exhibits no discharge. No scleral icterus.  Neck: No tracheal deviation present.  Cardiovascular: Normal rate, regular rhythm, normal heart sounds and intact distal pulses.  Exam reveals no gallop and no friction rub.   No murmur heard. Pulmonary/Chest: Effort normal and breath sounds normal. No respiratory distress. She has no wheezes. She has no rales. She exhibits no tenderness.  Abdominal: Soft. Bowel sounds are normal. She exhibits no distension and no mass. There is tenderness. There is no rebound and no guarding.  Suprapubic tenderness (patient winced)  Musculoskeletal: She exhibits tenderness. She exhibits no edema.  Right and left femoral and hip tenderness (patient winced). No midline cervical, thoracic, lumbar spinal tenderness.   Lymphadenopathy:    She has no cervical adenopathy.  Neurological: She is alert. Coordination normal.  Skin: Skin is warm and dry. No rash noted. She is not diaphoretic. No erythema.  Psychiatric: She has a normal mood and affect. Her behavior is normal.  Nursing note and vitals reviewed.  ED Course  Procedures  Labs Review Labs Reviewed - No data to display  Imaging Review Dg Femur Min 2 Views Left  08/06/2015   CLINICAL DATA:  Unwitnessed fall at nursing facility. EXAM: LEFT FEMUR 2 VIEWS COMPARISON:  None. FINDINGS: There is no evidence of fracture or other focal bone lesions. Vascular calcifications are noted. IMPRESSION: Normal left femur. Electronically Signed   By: Marijo Conception, M.D.   On: 08/06/2015 08:41   Dg Femur, Min 2 Views Right  08/06/2015  CLINICAL DATA:  Unwitnessed fall at nursing facility. EXAM: RIGHT FEMUR 2 VIEWS COMPARISON:  Radiograph of Jul 09, 2015. FINDINGS: There is no evidence of fracture or other focal bone lesions. Vascular calcifications are noted. IMPRESSION: Normal right femur. Electronically Signed   By: Marijo Conception, M.D.   On: 08/06/2015 08:43  Dg Hips Bilat With Pelvis Min 5 Views  08/06/2015  CLINICAL DATA:  Unwitnessed fall at nursing facility. EXAM: DG HIP (WITH OR WITHOUT PELVIS) 5+V BILAT COMPARISON:  Radiographs of September 11, 2014. FINDINGS: There is no evidence of hip fracture or dislocation. There is no evidence of arthropathy or other focal bone abnormality. IMPRESSION: Normal bilateral hips. Electronically Signed   By: Marijo Conception, M.D.   On: 08/06/2015 08:40    I have personally reviewed and evaluated these images and lab results as part of my medical decision-making.  MDM   Final diagnoses:  Fall, initial encounter   Patient with unwitnessed fall. She has a history of falls with unremarkable workup in ED. She is nonverbal but grimaced with palpation of BL hips and femurs. No malrotation or shortening. No discolorations or edema. No evidence of trauma on exam.  Hips, pelvis, femur xrays unremarkable for acute change. Patient may be safely discharged back to facility.  Dr. Zenia Resides evaluated patient as well and agrees with above plan.   [place], PA-C 08/09/15 [id number]

## 2015-08-06 NOTE — ED Notes (Signed)
Bed: [id number] Expected date:  Expected time:  Means of arrival:  Comments: EMS 82yo F Fall

## 2015-08-06 NOTE — Discharge Instructions (Signed)
Ms. KANA KNITTEL,  Nice meeting you! Please follow-up with your primary care provider. Return to the emergency department if you develop headaches, loss of consciousness, nausea/vomiting, loss of bladder/bowel control, new/worsening symptoms. Feel better soon!  S. Wendie Simmer, PA-C

## 2015-08-09 ENCOUNTER — Emergency Department (HOSPITAL_COMMUNITY)
Admission: EM | Admit: 2015-08-09 | Discharge: 2015-08-09 | Disposition: A | Discharge status: Home / Self Care | Payer: Medicare Other | Attending: Emergency Medicine | Admitting: Emergency Medicine

## 2015-08-09 ENCOUNTER — Emergency Department (HOSPITAL_COMMUNITY): Payer: Medicare Other

## 2015-08-09 ENCOUNTER — Other Ambulatory Visit: Payer: Self-pay

## 2015-08-09 ENCOUNTER — Encounter (HOSPITAL_COMMUNITY): Payer: Self-pay | Admitting: Emergency Medicine

## 2015-08-09 DIAGNOSIS — Y929 Unspecified place or not applicable: Secondary | ICD-10-CM | POA: Insufficient documentation

## 2015-08-09 DIAGNOSIS — Y939 Activity, unspecified: Secondary | ICD-10-CM | POA: Insufficient documentation

## 2015-08-09 DIAGNOSIS — I739 Peripheral vascular disease, unspecified: Secondary | ICD-10-CM | POA: Diagnosis not present

## 2015-08-09 DIAGNOSIS — R51 Headache: Secondary | ICD-10-CM | POA: Diagnosis not present

## 2015-08-09 DIAGNOSIS — M1990 Unspecified osteoarthritis, unspecified site: Secondary | ICD-10-CM | POA: Diagnosis not present

## 2015-08-09 DIAGNOSIS — G309 Alzheimer's disease, unspecified: Secondary | ICD-10-CM | POA: Insufficient documentation

## 2015-08-09 DIAGNOSIS — Z79899 Other long term (current) drug therapy: Secondary | ICD-10-CM | POA: Insufficient documentation

## 2015-08-09 DIAGNOSIS — W19XXXA Unspecified fall, initial encounter: Secondary | ICD-10-CM | POA: Diagnosis not present

## 2015-08-09 DIAGNOSIS — E1122 Type 2 diabetes mellitus with diabetic chronic kidney disease: Secondary | ICD-10-CM | POA: Insufficient documentation

## 2015-08-09 DIAGNOSIS — I129 Hypertensive chronic kidney disease with stage 1 through stage 4 chronic kidney disease, or unspecified chronic kidney disease: Secondary | ICD-10-CM | POA: Insufficient documentation

## 2015-08-09 DIAGNOSIS — N184 Chronic kidney disease, stage 4 (severe): Secondary | ICD-10-CM | POA: Insufficient documentation

## 2015-08-09 DIAGNOSIS — Y999 Unspecified external cause status: Secondary | ICD-10-CM | POA: Insufficient documentation

## 2015-08-09 LAB — URINALYSIS, ROUTINE W REFLEX MICROSCOPIC
Bilirubin Urine: NEGATIVE
Glucose, UA: NEGATIVE mg/dL
Hgb urine dipstick: NEGATIVE
Ketones, ur: NEGATIVE mg/dL
Leukocytes, UA: NEGATIVE
Nitrite: NEGATIVE
Protein, ur: NEGATIVE mg/dL
Specific Gravity, Urine: 1.01 (ref 1.005–1.030)
pH: 6 (ref 5.0–8.0)

## 2015-08-09 LAB — CBC WITH DIFFERENTIAL/PLATELET
Basophils Absolute: 0 10*3/uL (ref 0.0–0.1)
Basophils Relative: 0 %
Eosinophils Absolute: 0.2 10*3/uL (ref 0.0–0.7)
Eosinophils Relative: 4 %
HCT: 30.8 % — ABNORMAL LOW (ref 36.0–46.0)
Hemoglobin: 10.2 g/dL — ABNORMAL LOW (ref 12.0–15.0)
Lymphocytes Relative: 32 %
Lymphs Abs: 1.6 10*3/uL (ref 0.7–4.0)
MCH: 23.9 pg — ABNORMAL LOW (ref 26.0–34.0)
MCHC: 33.1 g/dL (ref 30.0–36.0)
MCV: 72.1 fL — ABNORMAL LOW (ref 78.0–100.0)
Monocytes Absolute: 1 10*3/uL (ref 0.1–1.0)
Monocytes Relative: 18 %
Neutro Abs: 2.4 10*3/uL (ref 1.7–7.7)
Neutrophils Relative %: 46 %
Platelets: 321 10*3/uL (ref 150–400)
RBC: 4.27 MIL/uL (ref 3.87–5.11)
RDW: 15.4 % (ref 11.5–15.5)
WBC: 5.2 10*3/uL (ref 4.0–10.5)

## 2015-08-09 NOTE — ED Notes (Signed)
[facility] called  

## 2015-08-09 NOTE — Discharge Instructions (Signed)

## 2015-08-09 NOTE — ED Notes (Signed)
Pt BIB [facility] after an unwitnessed fall today. C/O Front R head pain. HOH. Dementia. Alert.

## 2015-08-09 NOTE — ED Notes (Signed)
Pt to CT

## 2015-08-09 NOTE — ED Provider Notes (Signed)
CSN: [id number]     Arrival date & time 08/09/15  1613 History   First MD Initiated Contact with Patient 08/09/15 1649     Chief Complaint  Patient presents with  . Fall     (Consider location/radiation/quality/duration/timing/severity/associated sxs/prior Treatment) HPI   82 year old female present for evaluation after witnessed fall. She is a past history of Alzheimer's and is not a very good historian. Circumstances of the fall are not clear. There is no report of change from her baseline mental status. No blood thinners per her medication list. She has no external signs of trauma and generally does not appear distressed.  Past Medical History  Diagnosis Date  . Alzheimer disease   . Hypertension   . Diabetes mellitus without complication ([place])   . Arthritis   . Hearing impaired   . Cataracts, bilateral   . Recurrent falls   . Hemorrhoids   . Fecal impaction ([place]) 12/15/2012  . Protein-calorie malnutrition, severe ([place]) 12/16/2012  . Underweight 12/17/2012  . Anxiety disorder   . Chronic pain   . Heart block   . Peripheral vascular disease ([place])   . Chronic kidney disease, stage IV (severe) 12/14/2012   Past Surgical History  Procedure Laterality Date  . Tonsillectomy    . Appendectomy    . Cataract extraction, bilateral     Family History  Problem Relation Age of Onset  . Colon cancer Neg Hx   . Breast cancer      multiple  . Dementia Sister   . High blood pressure      multiple  . Diabetes      multiple   Social History  Substance Use Topics  . Smoking status: Never Smoker   . Smokeless tobacco: Never Used  . Alcohol Use: No   OB History    No data available     Review of Systems  Level V caveat because of advanced dementia.  Allergies  Ace inhibitors; Codeine; Penicillins; Sulfa antibiotics; and Tramadol  Home Medications   Prior to Admission medications   Medication Sig Start Date End Date Taking? Authorizing Provider  acetaminophen  (TYLENOL) 325 MG tablet Take 650 mg by mouth every 6 (six) hours as needed for moderate pain.    Yes Historical Provider, MD  amLODipine-valsartan (EXFORGE) 10-160 MG per tablet Take 1 tablet by mouth daily.    Yes Historical Provider, MD  cholecalciferol (VITAMIN D) 1000 UNITS tablet Take 1,000 Units by mouth daily.    Yes Historical Provider, MD  diclofenac sodium (VOLTAREN) 1 % GEL Apply 4 g topically 2 (two) times daily as needed (leg pain).    Yes Historical Provider, MD  divalproex (DEPAKOTE SPRINKLE) 125 MG capsule Take 125-250 mg by mouth 2 (two) times daily. Take 1 capsule in the am and Take 2 capsules in the pm   Yes Historical Provider, MD  ferrous sulfate 325 (65 FE) MG tablet Take 325 mg by mouth daily.    Yes Historical Provider, MD  lidocaine (LIDODERM) 5 % Place 1 patch onto the skin every 12 (twelve) hours. Remove & Discard patch within 12 hours or as directed by MD (apply every morning and remove every night   Yes Historical Provider, MD  LORazepam (ATIVAN) 0.5 MG tablet Take 0.5 mg by mouth 2 (two) times daily. May take an additional 0.25mg s every 6 hours as needed for agitation 08/11/13  Yes Historical Provider, MD  LORazepam in dextrose solution Take 0.5 mg by mouth every 8 (eight) hours  as needed (agitation).   Yes Historical Provider, MD  menthol-cetylpyridinium (CEPACOL) 3 MG lozenge Take 1 lozenge by mouth daily as needed for sore throat.   Yes Historical Provider, MD  metoprolol succinate (TOPROL-XL) 25 MG 24 hr tablet Take 12.5 mg by mouth daily.  08/09/13  Yes Historical Provider, MD  ondansetron (ZOFRAN-ODT) 4 MG disintegrating tablet Take 1 tablet (4 mg total) by mouth every 8 (eight) hours as needed for nausea. 06/23/14  Yes Varney Biles, MD  polyethylene glycol (MIRALAX / GLYCOLAX) packet Take 17 g by mouth daily as needed for mild constipation. Mix in 4 oz of water and drink   Yes Historical Provider, MD  polyvinyl alcohol (LIQUIFILM TEARS) 1.4 % ophthalmic solution Place  2 drops into both eyes every 2 (two) hours as needed for dry eyes.   Yes Historical Provider, MD  sertraline (ZOLOFT) 25 MG tablet Take 75 mg by mouth daily.   Yes Historical Provider, MD  simethicone (MYLICON) 0000000 MG chewable tablet Chew 125 mg by mouth as needed for flatulence.    Yes Historical Provider, MD  triamcinolone cream (KENALOG) 0.1 % Apply 1 application topically 3 (three) times daily as needed (dry skin).   Yes Historical Provider, MD  vitamin C (ASCORBIC ACID) 500 MG tablet Take 500 mg by mouth daily.    Yes Historical Provider, MD  ciprofloxacin (CIPRO) 250 MG tablet Take 1 tablet (250 mg total) by mouth every 12 (twelve) hours. Patient not taking: Reported on 08/06/2015 07/18/15   Daleen Bo, MD   BP 160/50 mmHg  Pulse 73  Temp(Src) 98.3 F (36.8 C) (Oral)  Resp 20  SpO2 97% Physical Exam  Constitutional: She appears well-developed and well-nourished. No distress.  HENT:  Head: Normocephalic and atraumatic.  No external signs of trauma  Eyes: Conjunctivae are normal. Right eye exhibits no discharge. Left eye exhibits no discharge.  Neck: Neck supple.  Cardiovascular: Normal rate, regular rhythm and normal heart sounds.  Exam reveals no gallop and no friction rub.   No murmur heard. Pulmonary/Chest: Effort normal and breath sounds normal. No respiratory distress.  Abdominal: Soft. She exhibits no distension. There is no tenderness.  Musculoskeletal: She exhibits no edema or tenderness.  No midline spinal tenderness but she does grimace with range of motion of her neck. No bony tenderness the extremities. She does grimace with range of motion of her right hip and also posterior right inguinal region. There is no shortening or malrotation of the right lower extremity. She is neurovascularly intact.  Neurological: She is alert.  Skin: Skin is warm and dry.  Nursing note and vitals reviewed.   ED Course  Procedures (including critical care time) Labs Review Labs  Reviewed  CBC WITH DIFFERENTIAL/PLATELET - Abnormal; Notable for the following:    Hemoglobin 10.2 (*)    HCT 30.8 (*)    MCV 72.1 (*)    MCH 23.9 (*)    All other components within normal limits  URINALYSIS, ROUTINE W REFLEX MICROSCOPIC (NOT AT [facility])    Imaging Review Ct Head Wo Contrast  08/09/2015  CLINICAL DATA:  82 year old with right frontal headache after falling today. History of dementia. EXAM: CT HEAD WITHOUT CONTRAST CT CERVICAL SPINE WITHOUT CONTRAST TECHNIQUE: Multidetector CT imaging of the head and cervical spine was performed following the standard protocol without intravenous contrast. Multiplanar CT image reconstructions of the cervical spine were also generated. COMPARISON:  Prior studies 07/20/2015. FINDINGS: CT HEAD FINDINGS Brain: There is no evidence of acute intracranial  hemorrhage, mass lesion, brain edema or extra-axial fluid collection. The ventricles and subarachnoid spaces are mildly prominent but stable. There is no CT evidence of acute cortical infarction. There are stable mild chronic small vessel ischemic changes in the periventricular white matter. Intracranial vascular calcifications are noted. Bones/sinuses/visualized face: The visualized paranasal sinuses, mastoid air cells and middle ears are clear. Possible mild soft tissue swelling in the frontal scalp. The calvarium is intact. CT CERVICAL SPINE FINDINGS Multilevel spondylosis is again noted with severe facet disease contributing to a stable anterolisthesis at C3-4, measuring 4 mm. There is 2 mm of anterolisthesis at C4-5 and a mild scoliosis. The C5-6 facet joints appear ankylosed. The C7-T1 facet joints may be ankylosed as well. No evidence of acute fracture or traumatic subluxation. The odontoid process is intact. No acute soft tissue findings are evident. Carotid atherosclerosis noted. IMPRESSION: 1. Stable head CT demonstrating no acute intracranial findings. Possible mild frontal scalp soft tissue swelling.  2. No evidence of acute cervical spine fracture, traumatic subluxation or static signs of instability. 3. Stable multilevel cervical spondylosis with chronic anterolisthesis at C3-4 and C4-5. Electronically Signed   By: Richardean Sale M.D.   On: 08/09/2015 18:52   Ct Cervical Spine Wo Contrast  08/09/2015  CLINICAL DATA:  82 year old with right frontal headache after falling today. History of dementia. EXAM: CT HEAD WITHOUT CONTRAST CT CERVICAL SPINE WITHOUT CONTRAST TECHNIQUE: Multidetector CT imaging of the head and cervical spine was performed following the standard protocol without intravenous contrast. Multiplanar CT image reconstructions of the cervical spine were also generated. COMPARISON:  Prior studies 07/20/2015. FINDINGS: CT HEAD FINDINGS Brain: There is no evidence of acute intracranial hemorrhage, mass lesion, brain edema or extra-axial fluid collection. The ventricles and subarachnoid spaces are mildly prominent but stable. There is no CT evidence of acute cortical infarction. There are stable mild chronic small vessel ischemic changes in the periventricular white matter. Intracranial vascular calcifications are noted. Bones/sinuses/visualized face: The visualized paranasal sinuses, mastoid air cells and middle ears are clear. Possible mild soft tissue swelling in the frontal scalp. The calvarium is intact. CT CERVICAL SPINE FINDINGS Multilevel spondylosis is again noted with severe facet disease contributing to a stable anterolisthesis at C3-4, measuring 4 mm. There is 2 mm of anterolisthesis at C4-5 and a mild scoliosis. The C5-6 facet joints appear ankylosed. The C7-T1 facet joints may be ankylosed as well. No evidence of acute fracture or traumatic subluxation. The odontoid process is intact. No acute soft tissue findings are evident. Carotid atherosclerosis noted. IMPRESSION: 1. Stable head CT demonstrating no acute intracranial findings. Possible mild frontal scalp soft tissue swelling. 2.  No evidence of acute cervical spine fracture, traumatic subluxation or static signs of instability. 3. Stable multilevel cervical spondylosis with chronic anterolisthesis at C3-4 and C4-5. Electronically Signed   By: Richardean Sale M.D.   On: 08/09/2015 18:52   Dg Hip Unilat With Pelvis 2-3 Views Right  08/09/2015  CLINICAL DATA:  Right hip pain after unwitnessed fall today. History of dementia. EXAM: DG HIP (WITH OR WITHOUT PELVIS) 2-3V RIGHT COMPARISON:  Radiographs 04/10/2015 and 07/25/2015. FINDINGS: The bones remain demineralized. There is no evidence of acute fracture or dislocation. There are stable mild degenerative changes of the hips, sacroiliac joints and lower lumbar spine. Prominent vascular calcifications noted in the right thigh. IMPRESSION: No evidence of acute fracture or dislocation. Electronically Signed   By: Richardean Sale M.D.   On: 08/09/2015 18:33   I have personally reviewed and  evaluated these images and lab results as part of my medical decision-making.   EKG Interpretation None      MDM   Final diagnoses:  Fall, initial encounter    82 year old female presenting after unwitnessed fall. He really cannot get much useful history from the patient herself. She did seem to grimace with range of motion of her neck. She also grimace with range of motion of her right hip and pointed to her right inguinal region as she did this. There is no shortening or malrotation. She appears to be neurovascularly intact. Imaging is without acute abnormality of either CT of the head, cervical spine or her right hip. She is afebrile. Hemodynamically stable. Will check urinalysis. Anticipate discharge back to facility    Virgel Manifold, MD 08/11/15 2204

## 2015-08-20 ENCOUNTER — Emergency Department (HOSPITAL_COMMUNITY): Payer: Medicare Other

## 2015-08-20 ENCOUNTER — Emergency Department (HOSPITAL_COMMUNITY)
Admission: EM | Admit: 2015-08-20 | Discharge: 2015-08-21 | Disposition: A | Discharge status: Home / Self Care | Payer: Medicare Other | Attending: Emergency Medicine | Admitting: Emergency Medicine

## 2015-08-20 ENCOUNTER — Encounter (HOSPITAL_COMMUNITY): Payer: Self-pay | Admitting: Nurse Practitioner

## 2015-08-20 DIAGNOSIS — Z791 Long term (current) use of non-steroidal anti-inflammatories (NSAID): Secondary | ICD-10-CM | POA: Diagnosis not present

## 2015-08-20 DIAGNOSIS — S52502A Unspecified fracture of the lower end of left radius, initial encounter for closed fracture: Secondary | ICD-10-CM | POA: Diagnosis not present

## 2015-08-20 DIAGNOSIS — N184 Chronic kidney disease, stage 4 (severe): Secondary | ICD-10-CM | POA: Insufficient documentation

## 2015-08-20 DIAGNOSIS — H1032 Unspecified acute conjunctivitis, left eye: Secondary | ICD-10-CM | POA: Insufficient documentation

## 2015-08-20 DIAGNOSIS — I129 Hypertensive chronic kidney disease with stage 1 through stage 4 chronic kidney disease, or unspecified chronic kidney disease: Secondary | ICD-10-CM | POA: Diagnosis not present

## 2015-08-20 DIAGNOSIS — E119 Type 2 diabetes mellitus without complications: Secondary | ICD-10-CM | POA: Insufficient documentation

## 2015-08-20 DIAGNOSIS — M1990 Unspecified osteoarthritis, unspecified site: Secondary | ICD-10-CM | POA: Insufficient documentation

## 2015-08-20 DIAGNOSIS — G309 Alzheimer's disease, unspecified: Secondary | ICD-10-CM | POA: Diagnosis not present

## 2015-08-20 DIAGNOSIS — R059 Cough, unspecified: Secondary | ICD-10-CM

## 2015-08-20 DIAGNOSIS — H1031 Unspecified acute conjunctivitis, right eye: Secondary | ICD-10-CM | POA: Diagnosis not present

## 2015-08-20 DIAGNOSIS — R05 Cough: Secondary | ICD-10-CM

## 2015-08-20 DIAGNOSIS — Z79899 Other long term (current) drug therapy: Secondary | ICD-10-CM | POA: Diagnosis not present

## 2015-08-20 DIAGNOSIS — I739 Peripheral vascular disease, unspecified: Secondary | ICD-10-CM | POA: Insufficient documentation

## 2015-08-20 DIAGNOSIS — H1030 Unspecified acute conjunctivitis, unspecified eye: Secondary | ICD-10-CM

## 2015-08-20 MED ORDER — CIPROFLOXACIN HCL 0.3 % OP SOLN
2.0000 [drp] | Freq: Once | OPHTHALMIC | Status: AC
  Administered 2015-08-21: 2 [drp] via OPHTHALMIC
  Filled 2015-08-20: qty 2.5

## 2015-08-20 NOTE — ED Notes (Signed)
Bed: WA06 Expected date:  Expected time:  Means of arrival:  Comments: 31 F cough

## 2015-08-20 NOTE — ED Notes (Signed)
Pt is presented from [facility] memory unit for evaluation of a productive cough and lack of appetite. Pt is reportedly deaf.

## 2015-08-20 NOTE — ED Provider Notes (Signed)
CSN: [id number]     Arrival date & time 08/20/15  2254 History  By signing my name below, I, Higinio Plan, attest that this documentation has been prepared under the direction and in the presence of Noemi Chapel, MD . Electronically Signed: Higinio Plan, Scribe. 08/20/2015. 11:36 PM.  Chief Complaint  Patient presents with  . Cough   The history is provided by the patient. No language interpreter was used.   HPI Comments: [name] Ramos is a 82 y.o. female with PMHx of HTN and DM, who presents to the Emergency Department by EMS with a complaint from [facility] memory unit of productive cough that began PTA. Pt endorses that she coughs occasionally. Pt states associated drainage from her right eye. Pt denies difficulty breathing, chest pain, fever, and nausea.   Past Medical History  Diagnosis Date  . Alzheimer disease   . Hypertension   . Diabetes mellitus without complication ([place])   . Arthritis   . Hearing impaired   . Cataracts, bilateral   . Recurrent falls   . Hemorrhoids   . Fecal impaction ([place]) 12/15/2012  . Protein-calorie malnutrition, severe ([place]) 12/16/2012  . Underweight 12/17/2012  . Anxiety disorder   . Chronic pain   . Heart block   . Peripheral vascular disease ([place])   . Chronic kidney disease, stage IV (severe) 12/14/2012   Past Surgical History  Procedure Laterality Date  . Tonsillectomy    . Appendectomy    . Cataract extraction, bilateral     Family History  Problem Relation Age of Onset  . Colon cancer Neg Hx   . Breast cancer      multiple  . Dementia Sister   . High blood pressure      multiple  . Diabetes      multiple   Social History  Substance Use Topics  . Smoking status: Never Smoker   . Smokeless tobacco: Never Used  . Alcohol Use: No   OB History    No data available     Review of Systems  Constitutional: Negative for fever.  Respiratory: Positive for cough. Negative for shortness of breath.   Cardiovascular: Negative for  chest pain.  Gastrointestinal: Negative for nausea.   Allergies  Ace inhibitors; Codeine; Penicillins; Sulfa antibiotics; and Tramadol  Home Medications   Prior to Admission medications   Medication Sig Start Date End Date Taking? Authorizing Provider  acetaminophen (TYLENOL) 325 MG tablet Take 650 mg by mouth every 6 (six) hours as needed for moderate pain.    Yes Historical Provider, MD  amLODipine-valsartan (EXFORGE) 10-160 MG per tablet Take 1 tablet by mouth daily.    Yes Historical Provider, MD  cholecalciferol (VITAMIN D) 1000 UNITS tablet Take 1,000 Units by mouth daily.    Yes Historical Provider, MD  citalopram (CELEXA) 20 MG tablet Take 20 mg by mouth daily.   Yes Historical Provider, MD  diclofenac sodium (VOLTAREN) 1 % GEL Apply 4 g topically 2 (two) times daily as needed (leg pain).    Yes Historical Provider, MD  divalproex (DEPAKOTE SPRINKLE) 125 MG capsule Take 125-250 mg by mouth 2 (two) times daily. Take 1 capsule in the am and Take 2 capsules in the pm   Yes Historical Provider, MD  ferrous sulfate 325 (65 FE) MG tablet Take 325 mg by mouth daily.    Yes Historical Provider, MD  lidocaine (LIDODERM) 5 % Place 1 patch onto the skin every 12 (twelve) hours. Remove & Discard patch  within 12 hours or as directed by MD (apply every morning and remove every night   Yes Historical Provider, MD  LORazepam (ATIVAN) 0.5 MG tablet Take 0.5 mg by mouth 2 (two) times daily. May take an additional 0.25mg s every 6 hours as needed for agitation 08/11/13  Yes Historical Provider, MD  Lorazepam POWD Apply 1 mg/mL topically every 8 (eight) hours as needed (for agitation (topical gel)).   Yes Historical Provider, MD  menthol-cetylpyridinium (CEPACOL) 3 MG lozenge Take 1 lozenge by mouth daily as needed for sore throat.   Yes Historical Provider, MD  metoprolol succinate (TOPROL-XL) 25 MG 24 hr tablet Take 12.5 mg by mouth daily.  08/09/13  Yes Historical Provider, MD  ondansetron (ZOFRAN-ODT) 4  MG disintegrating tablet Take 1 tablet (4 mg total) by mouth every 8 (eight) hours as needed for nausea. 06/23/14  Yes Varney Biles, MD  polyethylene glycol (MIRALAX / GLYCOLAX) packet Take 17 g by mouth daily as needed for mild constipation. Mix in 4 oz of water and drink   Yes Historical Provider, MD  polyvinyl alcohol (LIQUIFILM TEARS) 1.4 % ophthalmic solution Place 2 drops into both eyes every 2 (two) hours as needed for dry eyes.   Yes Historical Provider, MD  simethicone (MYLICON) 0000000 MG chewable tablet Chew 125 mg by mouth daily as needed for flatulence.    Yes Historical Provider, MD  triamcinolone cream (KENALOG) 0.1 % Apply 1 application topically 3 (three) times daily as needed (dry skin).   Yes Historical Provider, MD  vitamin C (ASCORBIC ACID) 500 MG tablet Take 500 mg by mouth daily.    Yes Historical Provider, MD  ciprofloxacin (CIPRO) 250 MG tablet Take 1 tablet (250 mg total) by mouth every 12 (twelve) hours. Patient not taking: Reported on 08/06/2015 07/18/15   Daleen Bo, MD   BP 165/58 mmHg  Pulse 78  Temp(Src) 98.2 F (36.8 C) (Oral)  Resp 18  SpO2 99% Physical Exam  Constitutional: She appears well-developed and well-nourished. No distress.  HENT:  Head: Normocephalic and atraumatic.  Mouth/Throat: Oropharynx is clear and moist. No oropharyngeal exudate.  Eyes: EOM are normal. Pupils are equal, round, and reactive to light. Right eye exhibits discharge. Left eye exhibits discharge. No scleral icterus.  Small amount of purulent drainage from her bilateral eyes with red injected conjunctiva.   Neck: Normal range of motion. Neck supple. No JVD present. No thyromegaly present.  No lymphadenopathy  Cardiovascular: Normal rate, regular rhythm, normal heart sounds and intact distal pulses.  Exam reveals no gallop and no friction rub.   No murmur heard. Pulmonary/Chest: Effort normal and breath sounds normal. No respiratory distress. She has no wheezes. She has no rales. She  exhibits no tenderness.  No increased WOB  No accessory muscle use at a normal respiratory rate  Abdominal: Soft. Bowel sounds are normal. She exhibits no distension and no mass. There is no tenderness. There is no guarding.  Musculoskeletal: Normal range of motion. She exhibits no edema or tenderness.  Scant pitting edema below the knees; it is symmetrical  Lymphadenopathy:    She has no cervical adenopathy.  Neurological: She is alert. Coordination normal.  Skin: Skin is warm and dry. No rash noted. No erythema.  Psychiatric: She has a normal mood and affect. Her behavior is normal.  Nursing note and vitals reviewed.  ED Course  Procedures  DIAGNOSTIC STUDIES:  Oxygen Saturation is 99% on RA, normal by my interpretation.    COORDINATION OF CARE:  11:34 PM Discussed treatment plan, which includes X-ray and eye drops with pt at bedside and pt agreed to plan.  Labs Review Labs Reviewed - No data to display  Imaging Review Dg Chest 2 View  08/21/2015  CLINICAL DATA:  82 year old female with productive cough. EXAM: CHEST  2 VIEW COMPARISON:  07/18/2015 FINDINGS: The cardiomediastinal contours are unchanged, mild atherosclerosis of the aortic arch. The lungs are clear. Pulmonary vasculature is normal. No consolidation, pleural effusion, or pneumothorax. A skin fold projects over the right hemithorax. No acute osseous abnormalities are seen. Chronic change about the right shoulder. IMPRESSION: No active cardiopulmonary disease. Electronically Signed   By: Jeb Levering M.D.   On: 08/21/2015 00:17   I have personally reviewed and evaluated these images and lab results as part of my medical decision-making.    MDM   Final diagnoses:  Cough  Acute conjunctivitis    The patient appears well, her vital signs are unremarkable, her chest x-ray shows no signs of pneumonia, pneumothorax or other acute findings. She has been given topical antibiotic drops for her eyes, she appears stable  for discharge.  I personally performed the services described in this documentation, which was scribed in my presence. The recorded information has been reviewed and is accurate.     Meds given in ED:  Medications  ciprofloxacin (CILOXAN) 0.3 % ophthalmic solution 2 drop (2 drops Both Eyes Given 08/21/15 0003)    New Prescriptions   No medications on file       Noemi Chapel, MD 08/21/15 [id number]

## 2015-08-21 ENCOUNTER — Emergency Department (HOSPITAL_COMMUNITY): Payer: Medicare Other

## 2015-08-21 ENCOUNTER — Encounter (HOSPITAL_COMMUNITY): Payer: Self-pay | Admitting: Emergency Medicine

## 2015-08-21 ENCOUNTER — Emergency Department (HOSPITAL_COMMUNITY)
Admission: EM | Admit: 2015-08-21 | Discharge: 2015-08-21 | Disposition: A | Discharge status: Home / Self Care | Payer: Medicare Other | Attending: Emergency Medicine | Admitting: Emergency Medicine

## 2015-08-21 DIAGNOSIS — W19XXXA Unspecified fall, initial encounter: Secondary | ICD-10-CM | POA: Insufficient documentation

## 2015-08-21 DIAGNOSIS — S5292XA Unspecified fracture of left forearm, initial encounter for closed fracture: Secondary | ICD-10-CM

## 2015-08-21 DIAGNOSIS — N184 Chronic kidney disease, stage 4 (severe): Secondary | ICD-10-CM | POA: Insufficient documentation

## 2015-08-21 DIAGNOSIS — G309 Alzheimer's disease, unspecified: Secondary | ICD-10-CM | POA: Diagnosis not present

## 2015-08-21 DIAGNOSIS — Y999 Unspecified external cause status: Secondary | ICD-10-CM | POA: Insufficient documentation

## 2015-08-21 DIAGNOSIS — Y939 Activity, unspecified: Secondary | ICD-10-CM | POA: Insufficient documentation

## 2015-08-21 DIAGNOSIS — E1151 Type 2 diabetes mellitus with diabetic peripheral angiopathy without gangrene: Secondary | ICD-10-CM | POA: Insufficient documentation

## 2015-08-21 DIAGNOSIS — S4992XA Unspecified injury of left shoulder and upper arm, initial encounter: Secondary | ICD-10-CM | POA: Diagnosis present

## 2015-08-21 DIAGNOSIS — Y92121 Bathroom in nursing home as the place of occurrence of the external cause: Secondary | ICD-10-CM | POA: Insufficient documentation

## 2015-08-21 DIAGNOSIS — I129 Hypertensive chronic kidney disease with stage 1 through stage 4 chronic kidney disease, or unspecified chronic kidney disease: Secondary | ICD-10-CM | POA: Diagnosis not present

## 2015-08-21 DIAGNOSIS — Z79899 Other long term (current) drug therapy: Secondary | ICD-10-CM | POA: Insufficient documentation

## 2015-08-21 DIAGNOSIS — S52502A Unspecified fracture of the lower end of left radius, initial encounter for closed fracture: Secondary | ICD-10-CM | POA: Insufficient documentation

## 2015-08-21 DIAGNOSIS — E1122 Type 2 diabetes mellitus with diabetic chronic kidney disease: Secondary | ICD-10-CM | POA: Diagnosis not present

## 2015-08-21 DIAGNOSIS — W1800XA Striking against unspecified object with subsequent fall, initial encounter: Secondary | ICD-10-CM | POA: Diagnosis not present

## 2015-08-21 DIAGNOSIS — M1990 Unspecified osteoarthritis, unspecified site: Secondary | ICD-10-CM | POA: Diagnosis not present

## 2015-08-21 DIAGNOSIS — H1032 Unspecified acute conjunctivitis, left eye: Secondary | ICD-10-CM | POA: Diagnosis not present

## 2015-08-21 MED ORDER — HYDROCODONE-ACETAMINOPHEN 5-325 MG PO TABS: 1.0000 | ORAL_TABLET | Freq: Four times a day (QID) | ORAL | Status: DC | PRN

## 2015-08-21 NOTE — ED Notes (Signed)
Bed: [facility] Expected date:  Expected time:  Means of arrival:  Comments: 

## 2015-08-21 NOTE — ED Notes (Signed)
Per EMS, pt from [facility] pt found in hall bathroom with her wrist in the rail and her body on the floor. Pt guarding her left arm. Hx of dementia.

## 2015-08-21 NOTE — ED Notes (Signed)
[facility] called for transport.  

## 2015-08-21 NOTE — ED Notes (Signed)
Patient transported to X-ray 

## 2015-08-21 NOTE — Discharge Instructions (Signed)
°Cast or Splint Care  ° ° °Casts and splints support injured limbs and keep bones from moving while they heal. It is important to care for your cast or splint at home.  °HOME CARE INSTRUCTIONS  °Keep the cast or splint uncovered during the drying period. It can take 24 to 48 hours to dry if it is made of plaster. A fiberglass cast will dry in less than 1 hour.  °Do not rest the cast on anything harder than a pillow for the first 24 hours.  °Do not put weight on your injured limb or apply pressure to the cast until your health care provider gives you permission.  °Keep the cast or splint dry. Wet casts or splints can lose their shape and may not support the limb as well. A wet cast that has lost its shape can also create harmful pressure on your skin when it dries. Also, wet skin can become infected.  °Cover the cast or splint with a plastic bag when bathing or when out in the rain or snow. If the cast is on the trunk of the body, take sponge baths until the cast is removed.  °If your cast does become wet, dry it with a towel or a blow dryer on the cool setting only. °Keep your cast or splint clean. Soiled casts may be wiped with a moistened cloth.  °Do not place any hard or soft foreign objects under your cast or splint, such as cotton, toilet paper, lotion, or powder.  °Do not try to scratch the skin under the cast with any object. The object could get stuck inside the cast. Also, scratching could lead to an infection. If itching is a problem, use a blow dryer on a cool setting to relieve discomfort.  °Do not trim or cut your cast or remove padding from inside of it.  °Exercise all joints next to the injury that are not immobilized by the cast or splint. For example, if you have a long leg cast, exercise the hip joint and toes. If you have an arm cast or splint, exercise the shoulder, elbow, thumb, and fingers.  °Elevate your injured arm or leg on 1 or 2 pillows for the first 1 to 3 days to decrease swelling and  pain. It is best if you can comfortably elevate your cast so it is higher than your heart. °SEEK MEDICAL CARE IF:  °Your cast or splint cracks.  °Your cast or splint is too tight or too loose.  °You have unbearable itching inside the cast.  °Your cast becomes wet or develops a soft spot or area.  °You have a bad smell coming from inside your cast.  °You get an object stuck under your cast.  °Your skin around the cast becomes red or raw.  °You have new pain or worsening pain after the cast has been applied. °SEEK IMMEDIATE MEDICAL CARE IF:  °You have fluid leaking through the cast.  °You are unable to move your fingers or toes.  °You have discolored (blue or white), cool, painful, or very swollen fingers or toes beyond the cast.  °You have tingling or numbness around the injured area.  °You have severe pain or pressure under the cast.  °You have any difficulty with your breathing or have shortness of breath.  °You have chest pain. °This information is not intended to replace advice given to you by your health care provider. Make sure you discuss any questions you have with your health care provider.  °  Document Released: 02/02/2000 Document Revised: 11/25/2012 Document Reviewed: 08/13/2012  °Elsevier Interactive Patient Education ©2016 [employer]  ° °

## 2015-08-21 NOTE — ED Notes (Signed)
Patient with advanced dementia unable to answer questions or identify where pain is. Pt A&O to baseline.

## 2015-08-21 NOTE — ED Provider Notes (Signed)
CSN: [id number]     Arrival date & time 08/21/15  0940 History   First MD Initiated Contact with Patient 08/21/15 1000     Chief Complaint  Patient presents with  . Fall  . Arm Pain     (Consider location/radiation/quality/duration/timing/severity/associated sxs/prior Treatment) HPI Comments: Patient here after unwitnessed fall the nursing home where she was found in the bathroom with her left wrist against an object in her body on the floor. Has a history of dementia and does not communicate. No bleeding appreciated. No trauma noted. Transported here for further evaluation no treatment given prior to arrival.  Patient is a 82 y.o. female presenting with fall and arm pain. The history is provided by the patient, the EMS personnel and the nursing home. The history is limited by the condition of the patient.  Fall  Arm Pain    Past Medical History  Diagnosis Date  . Alzheimer disease   . Hypertension   . Diabetes mellitus without complication ([place])   . Arthritis   . Hearing impaired   . Cataracts, bilateral   . Recurrent falls   . Hemorrhoids   . Fecal impaction ([place]) 12/15/2012  . Protein-calorie malnutrition, severe ([place]) 12/16/2012  . Underweight 12/17/2012  . Anxiety disorder   . Chronic pain   . Heart block   . Peripheral vascular disease ([place])   . Chronic kidney disease, stage IV (severe) 12/14/2012   Past Surgical History  Procedure Laterality Date  . Tonsillectomy    . Appendectomy    . Cataract extraction, bilateral     Family History  Problem Relation Age of Onset  . Colon cancer Neg Hx   . Breast cancer      multiple  . Dementia Sister   . High blood pressure      multiple  . Diabetes      multiple   Social History  Substance Use Topics  . Smoking status: Never Smoker   . Smokeless tobacco: Never Used  . Alcohol Use: No   OB History    No data available     Review of Systems  Unable to perform ROS: Dementia      Allergies  Ace  inhibitors; Codeine; Penicillins; Sulfa antibiotics; and Tramadol  Home Medications   Prior to Admission medications   Medication Sig Start Date End Date Taking? Authorizing Provider  acetaminophen (TYLENOL) 325 MG tablet Take 650 mg by mouth every 6 (six) hours as needed for moderate pain.     Historical Provider, MD  amLODipine-valsartan (EXFORGE) 10-160 MG per tablet Take 1 tablet by mouth daily.     Historical Provider, MD  cholecalciferol (VITAMIN D) 1000 UNITS tablet Take 1,000 Units by mouth daily.     Historical Provider, MD  ciprofloxacin (CIPRO) 250 MG tablet Take 1 tablet (250 mg total) by mouth every 12 (twelve) hours. Patient not taking: Reported on 08/06/2015 07/18/15   Daleen Bo, MD  citalopram (CELEXA) 20 MG tablet Take 20 mg by mouth daily.    Historical Provider, MD  diclofenac sodium (VOLTAREN) 1 % GEL Apply 4 g topically 2 (two) times daily as needed (leg pain).     Historical Provider, MD  divalproex (DEPAKOTE SPRINKLE) 125 MG capsule Take 125-250 mg by mouth 2 (two) times daily. Take 1 capsule in the am and Take 2 capsules in the pm    Historical Provider, MD  ferrous sulfate 325 (65 FE) MG tablet Take 325 mg by mouth daily.  Historical Provider, MD  lidocaine (LIDODERM) 5 % Place 1 patch onto the skin every 12 (twelve) hours. Remove & Discard patch within 12 hours or as directed by MD (apply every morning and remove every night    Historical Provider, MD  LORazepam (ATIVAN) 0.5 MG tablet Take 0.5 mg by mouth 2 (two) times daily. May take an additional 0.25mg s every 6 hours as needed for agitation 08/11/13   Historical Provider, MD  Lorazepam POWD Apply 1 mg/mL topically every 8 (eight) hours as needed (for agitation (topical gel)).    Historical Provider, MD  menthol-cetylpyridinium (CEPACOL) 3 MG lozenge Take 1 lozenge by mouth daily as needed for sore throat.    Historical Provider, MD  metoprolol succinate (TOPROL-XL) 25 MG 24 hr tablet Take 12.5 mg by mouth daily.   08/09/13   Historical Provider, MD  ondansetron (ZOFRAN-ODT) 4 MG disintegrating tablet Take 1 tablet (4 mg total) by mouth every 8 (eight) hours as needed for nausea. 06/23/14   Varney Biles, MD  polyethylene glycol (MIRALAX / GLYCOLAX) packet Take 17 g by mouth daily as needed for mild constipation. Mix in 4 oz of water and drink    Historical Provider, MD  polyvinyl alcohol (LIQUIFILM TEARS) 1.4 % ophthalmic solution Place 2 drops into both eyes every 2 (two) hours as needed for dry eyes.    Historical Provider, MD  simethicone (MYLICON) 0000000 MG chewable tablet Chew 125 mg by mouth daily as needed for flatulence.     Historical Provider, MD  triamcinolone cream (KENALOG) 0.1 % Apply 1 application topically 3 (three) times daily as needed (dry skin).    Historical Provider, MD  vitamin C (ASCORBIC ACID) 500 MG tablet Take 500 mg by mouth daily.     Historical Provider, MD   BP 184/73 mmHg  Pulse 85  Temp(Src) 98 F (36.7 C) (Oral)  Resp 18  SpO2 96% Physical Exam  Constitutional: She appears cachectic. She is uncooperative.  HENT:  Head: Normocephalic and atraumatic.  Eyes: Conjunctivae, EOM and lids are normal. Pupils are equal, round, and reactive to light.  Neck: Normal range of motion. Neck supple. No tracheal deviation present. No thyroid mass present.  Cardiovascular: Normal rate, regular rhythm and normal heart sounds.  Exam reveals no gallop.   No murmur heard. Pulmonary/Chest: Effort normal and breath sounds normal. No stridor. No respiratory distress. She has no decreased breath sounds. She has no wheezes. She has no rhonchi. She has no rales.  Abdominal: Soft. Normal appearance and bowel sounds are normal. She exhibits no distension. There is no tenderness. There is no rebound and no CVA tenderness.  Musculoskeletal: Normal range of motion. She exhibits no edema.       Left elbow: She exhibits swelling.       Left wrist: She exhibits tenderness and bony tenderness.   Neurological: She is alert. She displays atrophy. No cranial nerve deficit or sensory deficit. GCS eye subscore is 4. GCS verbal subscore is 3. GCS motor subscore is 5.  Skin: Skin is warm and dry. No abrasion and no rash noted.  Nursing note and vitals reviewed.   ED Course  Procedures (including critical care time) Labs Review Labs Reviewed - No data to display  Imaging Review Dg Chest 2 View  08/21/2015  CLINICAL DATA:  82 year old female with productive cough. EXAM: CHEST  2 VIEW COMPARISON:  07/18/2015 FINDINGS: The cardiomediastinal contours are unchanged, mild atherosclerosis of the aortic arch. The lungs are clear. Pulmonary vasculature is  normal. No consolidation, pleural effusion, or pneumothorax. A skin fold projects over the right hemithorax. No acute osseous abnormalities are seen. Chronic change about the right shoulder. IMPRESSION: No active cardiopulmonary disease. Electronically Signed   By: Jeb Levering M.D.   On: 08/21/2015 00:17   I have personally reviewed and evaluated these images and lab results as part of my medical decision-making.   EKG Interpretation None      MDM   Final diagnoses:  None  Patient placed in splint by orthopedic tech and was given or the follow-up    Lacretia Leigh, MD 08/21/15 1209

## 2015-08-21 NOTE — ED Notes (Signed)
Bed: WA03 Expected date:  Expected time:  Means of arrival:  Comments: 63F/arm injury/deformity

## 2015-08-21 NOTE — Discharge Instructions (Signed)
Ciloxan - 2 drops in each eye every 12 hours for 7 days

## 2015-08-29 ENCOUNTER — Other Ambulatory Visit: Payer: Self-pay | Admitting: Orthopaedic Surgery

## 2015-08-29 MED ORDER — CLINDAMYCIN PHOSPHATE 900 MG/50ML IV SOLN
900.0000 mg | INTRAVENOUS | Status: AC
  Administered 2015-08-30: 900 mg via INTRAVENOUS
  Filled 2015-08-29: qty 50

## 2015-08-29 NOTE — Progress Notes (Signed)
Spoke with [employer], Transport planner at [facility] (Alzheimer's care facility). She verified pt's allergies, medications and medical history. Pt's legal guardian, who is her son-in-law will be bringing pt to the hospital.  Crystal requested that I send an order from the anesthesiologist of the meds to hold and which meds need to be given to pt in AM. Dr. Linna Caprice signed order sheet and it was faxed to [place] is diabetic. Crystal states pt is no longer treated for diabetes and they do no have an order to check her CBG's.

## 2015-08-30 ENCOUNTER — Ambulatory Visit (HOSPITAL_COMMUNITY): Payer: Medicare Other

## 2015-08-30 ENCOUNTER — Ambulatory Visit (HOSPITAL_COMMUNITY): Payer: Medicare Other | Admitting: Anesthesiology

## 2015-08-30 ENCOUNTER — Ambulatory Visit (HOSPITAL_COMMUNITY)
Admission: RE | Admit: 2015-08-30 | Discharge: 2015-08-30 | Disposition: A | Discharge status: Skilled Nursing Facility | Payer: Medicare Other | Source: Ambulatory Visit | Attending: Orthopaedic Surgery | Admitting: Orthopaedic Surgery

## 2015-08-30 ENCOUNTER — Encounter (HOSPITAL_COMMUNITY): Payer: Self-pay | Admitting: Urology

## 2015-08-30 ENCOUNTER — Encounter (HOSPITAL_COMMUNITY)
Admission: RE | Disposition: A | Discharge status: Skilled Nursing Facility | Payer: Self-pay | Source: Ambulatory Visit | Attending: Orthopaedic Surgery

## 2015-08-30 DIAGNOSIS — H9190 Unspecified hearing loss, unspecified ear: Secondary | ICD-10-CM | POA: Diagnosis not present

## 2015-08-30 DIAGNOSIS — I129 Hypertensive chronic kidney disease with stage 1 through stage 4 chronic kidney disease, or unspecified chronic kidney disease: Secondary | ICD-10-CM | POA: Diagnosis not present

## 2015-08-30 DIAGNOSIS — Z79899 Other long term (current) drug therapy: Secondary | ICD-10-CM | POA: Diagnosis not present

## 2015-08-30 DIAGNOSIS — N184 Chronic kidney disease, stage 4 (severe): Secondary | ICD-10-CM | POA: Insufficient documentation

## 2015-08-30 DIAGNOSIS — E43 Unspecified severe protein-calorie malnutrition: Secondary | ICD-10-CM | POA: Insufficient documentation

## 2015-08-30 DIAGNOSIS — G309 Alzheimer's disease, unspecified: Secondary | ICD-10-CM | POA: Insufficient documentation

## 2015-08-30 DIAGNOSIS — F419 Anxiety disorder, unspecified: Secondary | ICD-10-CM | POA: Insufficient documentation

## 2015-08-30 DIAGNOSIS — E1122 Type 2 diabetes mellitus with diabetic chronic kidney disease: Secondary | ICD-10-CM | POA: Diagnosis not present

## 2015-08-30 DIAGNOSIS — S52302A Unspecified fracture of shaft of left radius, initial encounter for closed fracture: Secondary | ICD-10-CM | POA: Insufficient documentation

## 2015-08-30 DIAGNOSIS — Z419 Encounter for procedure for purposes other than remedying health state, unspecified: Secondary | ICD-10-CM

## 2015-08-30 DIAGNOSIS — S52602A Unspecified fracture of lower end of left ulna, initial encounter for closed fracture: Secondary | ICD-10-CM | POA: Insufficient documentation

## 2015-08-30 DIAGNOSIS — X58XXXA Exposure to other specified factors, initial encounter: Secondary | ICD-10-CM | POA: Diagnosis not present

## 2015-08-30 HISTORY — DX: Anemia, unspecified: D64.9

## 2015-08-30 HISTORY — DX: Vitamin D deficiency, unspecified: E55.9

## 2015-08-30 HISTORY — DX: Other constipation: K59.09

## 2015-08-30 HISTORY — DX: Deficiency of other specified B group vitamins: E53.8

## 2015-08-30 HISTORY — PX: ORIF RADIAL FRACTURE: SHX5113

## 2015-08-30 LAB — POCT I-STAT, CHEM 8
BUN: 33 mg/dL — ABNORMAL HIGH (ref 6–20)
CALCIUM ION: 1.14 mmol/L (ref 1.12–1.23)
CREATININE: 1.1 mg/dL — AB (ref 0.44–1.00)
Chloride: 94 mmol/L — ABNORMAL LOW (ref 101–111)
GLUCOSE: 105 mg/dL — AB (ref 65–99)
HCT: 37 % (ref 36.0–46.0)
HEMOGLOBIN: 12.6 g/dL (ref 12.0–15.0)
POTASSIUM: 5.2 mmol/L — AB (ref 3.5–5.1)
Sodium: 133 mmol/L — ABNORMAL LOW (ref 135–145)
TCO2: 30 mmol/L (ref 0–100)

## 2015-08-30 LAB — GLUCOSE, CAPILLARY: Glucose-Capillary: 89 mg/dL (ref 65–99)

## 2015-08-30 SURGERY — OPEN REDUCTION INTERNAL FIXATION (ORIF) RADIAL FRACTURE
Anesthesia: Monitor Anesthesia Care | Site: Arm Lower | Laterality: Left

## 2015-08-30 MED ORDER — HYDROCODONE-ACETAMINOPHEN 5-325 MG PO TABS
1.0000 | ORAL_TABLET | Freq: Four times a day (QID) | ORAL | Status: DC | PRN
  Administered 2015-08-30: 1 via ORAL

## 2015-08-30 MED ORDER — LIDOCAINE 2% (20 MG/ML) 5 ML SYRINGE
INTRAMUSCULAR | Status: AC
  Filled 2015-08-30: qty 5

## 2015-08-30 MED ORDER — FENTANYL CITRATE (PF) 100 MCG/2ML IJ SOLN: 25.0000 ug | INTRAMUSCULAR | Status: DC | PRN

## 2015-08-30 MED ORDER — 0.9 % SODIUM CHLORIDE (POUR BTL) OPTIME
TOPICAL | Status: DC | PRN
  Administered 2015-08-30: 1000 mL

## 2015-08-30 MED ORDER — FENTANYL CITRATE (PF) 100 MCG/2ML IJ SOLN
INTRAMUSCULAR | Status: AC
  Filled 2015-08-30: qty 2

## 2015-08-30 MED ORDER — BUPIVACAINE-EPINEPHRINE (PF) 0.5% -1:200000 IJ SOLN
INTRAMUSCULAR | Status: DC | PRN
  Administered 2015-08-30: 25 mL via PERINEURAL

## 2015-08-30 MED ORDER — LACTATED RINGERS IV SOLN
INTRAVENOUS | Status: DC
Start: 2015-08-30 — End: 2015-08-30
  Administered 2015-08-30 (×2): via INTRAVENOUS

## 2015-08-30 MED ORDER — PHENYLEPHRINE 40 MCG/ML (10ML) SYRINGE FOR IV PUSH (FOR BLOOD PRESSURE SUPPORT)
PREFILLED_SYRINGE | INTRAVENOUS | Status: AC
  Filled 2015-08-30: qty 10

## 2015-08-30 MED ORDER — PROPOFOL 500 MG/50ML IV EMUL
INTRAVENOUS | Status: DC | PRN
  Administered 2015-08-30: 25 ug/kg/min via INTRAVENOUS

## 2015-08-30 MED ORDER — PHENYLEPHRINE HCL 10 MG/ML IJ SOLN
INTRAMUSCULAR | Status: DC | PRN
  Administered 2015-08-30 (×2): 80 ug via INTRAVENOUS

## 2015-08-30 MED ORDER — ONDANSETRON HCL 4 MG/2ML IJ SOLN
INTRAMUSCULAR | Status: AC
  Filled 2015-08-30: qty 2

## 2015-08-30 MED ORDER — HYDROCODONE-ACETAMINOPHEN 5-325 MG PO TABS: 1.0000 | ORAL_TABLET | Freq: Four times a day (QID) | ORAL | Status: DC | PRN

## 2015-08-30 MED ORDER — HYDROCODONE-ACETAMINOPHEN 5-325 MG PO TABS
ORAL_TABLET | ORAL | Status: AC
  Filled 2015-08-30: qty 1

## 2015-08-30 MED ORDER — PROPOFOL 10 MG/ML IV BOLUS
INTRAVENOUS | Status: AC
  Filled 2015-08-30: qty 20

## 2015-08-30 MED ORDER — FENTANYL CITRATE (PF) 100 MCG/2ML IJ SOLN: 100.0000 ug | Freq: Once | INTRAMUSCULAR | Status: DC

## 2015-08-30 SURGICAL SUPPLY — 66 items
BANDAGE ELASTIC 3 VELCRO ST LF (GAUZE/BANDAGES/DRESSINGS) ×3 IMPLANT
BANDAGE ELASTIC 4 VELCRO ST LF (GAUZE/BANDAGES/DRESSINGS) ×3 IMPLANT
BIT DRILL 2 FAST STEP (BIT) ×3 IMPLANT
BIT DRILL 2.0 (BIT) ×1
BIT DRILL 2.0MM (BIT) ×1
BIT DRILL 2.5X4 QC (BIT) ×3 IMPLANT
BIT DRILL 2X127 MARKED (BIT) ×3 IMPLANT
BIT DRILL 2XNS DISP SS SM FRAG (BIT) ×1 IMPLANT
BIT DRL 2XNS DISP SS SM FRAG (BIT) ×1
BLADE SURG 10 STRL SS (BLADE) ×3 IMPLANT
BLADE SURG 15 STRL LF DISP TIS (BLADE) ×1 IMPLANT
BLADE SURG 15 STRL SS (BLADE) ×2
BLADE SURG ROTATE 9660 (MISCELLANEOUS) IMPLANT
BNDG ESMARK 4X9 LF (GAUZE/BANDAGES/DRESSINGS) ×3 IMPLANT
BNDG GAUZE ELAST 4 BULKY (GAUZE/BANDAGES/DRESSINGS) ×3 IMPLANT
CORDS BIPOLAR (ELECTRODE) ×3 IMPLANT
COVER SURGICAL LIGHT HANDLE (MISCELLANEOUS) ×3 IMPLANT
CUFF TOURNIQUET SINGLE 18IN (TOURNIQUET CUFF) ×3 IMPLANT
CUFF TOURNIQUET SINGLE 24IN (TOURNIQUET CUFF) IMPLANT
DRAPE C-ARM 42X72 X-RAY (DRAPES) IMPLANT
DRAPE U-SHAPE 47X51 STRL (DRAPES) ×3 IMPLANT
ELECT CAUTERY BLADE 6.4 (BLADE) ×3 IMPLANT
GAUZE SPONGE 4X4 12PLY STRL (GAUZE/BANDAGES/DRESSINGS) ×3 IMPLANT
GAUZE XEROFORM 1X8 LF (GAUZE/BANDAGES/DRESSINGS) ×3 IMPLANT
GAUZE XEROFORM 5X9 LF (GAUZE/BANDAGES/DRESSINGS) ×3 IMPLANT
GLOVE SKINSENSE NS SZ7.5 (GLOVE) ×2
GLOVE SKINSENSE STRL SZ7.5 (GLOVE) ×1 IMPLANT
GOWN STRL REIN XL XLG (GOWN DISPOSABLE) ×3 IMPLANT
K-WIRE 1.6 (WIRE) ×4
K-WIRE FX5X1.6XNS BN SS (WIRE) ×2
KIT BASIN OR (CUSTOM PROCEDURE TRAY) ×3 IMPLANT
KIT ROOM TURNOVER OR (KITS) ×3 IMPLANT
KWIRE FX5X1.6XNS BN SS (WIRE) ×2 IMPLANT
NEEDLE 22X1 1/2 (OR ONLY) (NEEDLE) IMPLANT
NS IRRIG 1000ML POUR BTL (IV SOLUTION) ×3 IMPLANT
PACK ORTHO EXTREMITY (CUSTOM PROCEDURE TRAY) ×3 IMPLANT
PAD ARMBOARD 7.5X6 YLW CONV (MISCELLANEOUS) ×6 IMPLANT
PAD CAST 4YDX4 CTTN HI CHSV (CAST SUPPLIES) ×1 IMPLANT
PADDING CAST ABS 4INX4YD NS (CAST SUPPLIES) ×2
PADDING CAST ABS 6INX4YD NS (CAST SUPPLIES) ×2
PADDING CAST ABS COTTON 4X4 ST (CAST SUPPLIES) ×1 IMPLANT
PADDING CAST ABS COTTON 6X4 NS (CAST SUPPLIES) ×1 IMPLANT
PADDING CAST COTTON 4X4 STRL (CAST SUPPLIES) ×2
PLATE XLONG 24.4X89.5 LT (Plate) ×3 IMPLANT
SCREW BN 12X3.5XNS CORT TI (Screw) ×5 IMPLANT
SCREW CORT 3.5X12 (Screw) ×10 IMPLANT
SCREW CORTICAL 2.7MM  14MM (Screw) ×4 IMPLANT
SCREW CORTICAL 2.7MM 14MM (Screw) ×2 IMPLANT
SCREW LOCK 2.7X10 FOOT (Screw) ×12 IMPLANT
SCREW LOCK 2.7X12 FOOT (Screw) ×3 IMPLANT
SCREW LOCK 2.7X16 FOOT (Screw) ×3 IMPLANT
SCREW PEG 2.5X16 NONLOCK (Screw) ×6 IMPLANT
SCREW PEG 2.5X20 NONLOCK (Screw) ×6 IMPLANT
SCREW PEG LOCK 2.5X16 (Peg) ×6 IMPLANT
SPONGE GAUZE 4X4 12PLY STER LF (GAUZE/BANDAGES/DRESSINGS) ×3 IMPLANT
SPONGE LAP 4X18 X RAY DECT (DISPOSABLE) IMPLANT
SUT ETHILON 4 0 PS 2 18 (SUTURE) ×3 IMPLANT
SUT MNCRL AB 4-0 PS2 18 (SUTURE) IMPLANT
SUT PROLENE 3 0 PS 2 (SUTURE) IMPLANT
SUT VIC AB 3-0 FS2 27 (SUTURE) IMPLANT
SYR CONTROL 10ML LL (SYRINGE) IMPLANT
TOWEL OR 17X24 6PK STRL BLUE (TOWEL DISPOSABLE) ×3 IMPLANT
TOWEL OR 17X26 10 PK STRL BLUE (TOWEL DISPOSABLE) ×3 IMPLANT
TUBE CONNECTING 12'X1/4 (SUCTIONS) ×1
TUBE CONNECTING 12X1/4 (SUCTIONS) ×2 IMPLANT
UNDERPAD 30X30 INCONTINENT (UNDERPADS AND DIAPERS) ×3 IMPLANT

## 2015-08-30 NOTE — Op Note (Signed)
   Date of Surgery: 08/30/2015  INDICATIONS: [name] Ramos is a 82 y.o.-year-old female who sustained a forearm fracture; she was indicated for open reduction and internal fixation due to the displaced nature of the fracture and came to the operating room today for this procedure. The health care power of attorney did consent to the procedure after discussion of the risks and benefits.   PREOPERATIVE DIAGNOSIS: left forearm fracture (radius and ulna).  POSTOPERATIVE DIAGNOSIS: Same.  PROCEDURE: Open reduction and internal fixation left radius and ulna CPT: [id number]  SURGEON: N. Eduard Roux, M.D.  ASSIST: April Green, RNFA.  ANESTHESIA: regional and conscious sedation  IV FLUIDS AND URINE: See anesthesia.  ESTIMATED BLOOD LOSS: minimal mL.  IMPLANTS: Biomet  DRAINS: None.  COMPLICATIONS: None.  DESCRIPTION OF PROCEDURE: The patient was brought to the operating room and placed supine on the operating table.  The patient had been signed prior to the procedure. The patient had the anesthesia placed by the anesthesiologist.  The prep verification and incision time-outs were performed to confirm that this was the correct patient, site, side and location. The patient had SCDs in place on the lower extremities. The patient did receive antibiotics prior to the incision and was re-dosed during the procedure as needed at indicated intervals.  A well padded tourniquet was placed on the upper arm.  The upper extremity was prepped and draped in the standard fashion.  The bony landmarks were palpated and the incisions were drawn on the skin.  The tourniquet was inflated to 250 mmHg. A volar approach of Mallie Mussel was used to expose the radius. The interval between the FCR and radial artery was dissected down to the pronator quadratus distally. This was carried proximally and the FPL, pronator teres insertion were elevated off of the radial side of the radius gently, again protecting the neurovascular structures  and radial artery. The radial shaft fracture was first visualized and debrided of dead muscle. This was able to be reduced directly under direct visualization and the plate was placed in compression mode. The screws were all placed in a standard fashion: first drilling, then measuring with a depth gauge, then placing the screw on power and tightening by hand.  Attention was then turned to the ulna.  The incision was taken down directly to the ulna in the ECU/FCU interval.  The muscle was gently elevated off the fracture site and the fracture was able to be reduced directly.  The small fragment plate was again placed in compression mode as above.  The final x-rays were taken in both AP and lateral views to confirm the reduction and screw lengths and locations. The wounds were copiously irrigated with normal saline.  The wound closed in a layer fashion using 3.0 vicryl suture and the skin was re-approximated with 3-0 nylon. The wounds were cleaned and dried a final time and a sterile dressing was placed. The patient was then placed in a short arm splint. The patient was then transferred back to the bed and left the operating room in stable condition.  All sponge and instrument counts were correct.  POSTOPERATIVE PLAN: [name] will remain non-weight bearing on this arm for approximately three months; she will return for suture removal 2 weeks. Elbow range of motion, including supination and pronation, should start immediately.  [name] will receive DVT prophylaxis based on other medications, activity level, and risk ratio of bleeding to thrombosis.

## 2015-08-30 NOTE — Anesthesia Procedure Notes (Addendum)
Anesthesia Regional Block:  Supraclavicular block  Pre-Anesthetic Checklist: ,, timeout performed, Correct Patient, Correct Site, Correct Laterality, Correct Procedure, Correct Position, site marked, Risks and benefits discussed,  Surgical consent,  Pre-op evaluation,  At surgeon's request and post-op pain management  Laterality: Left  Prep: chloraprep       Needles:  Injection technique: Single-shot  Needle Type: Echogenic Stimulator Needle     Needle Length: 9cm 9 cm Needle Gauge: 21 and 21 G    Additional Needles:  Procedures: ultrasound guided (picture in chart) and nerve stimulator Supraclavicular block  Nerve Stimulator or Paresthesia:  Response: bicep and tricep, 0.5 mA,   Additional Responses:   Narrative:  Start time: 08/30/2015 4:25 PM End time: 08/30/2015 4:32 PM Injection made incrementally with aspirations every 5 mL.  Performed by: Personally  Anesthesiologist: Suzette Battiest  Additional Notes: Risks, benefits and alternative to block explained extensively.  Patient tolerated procedure well, without complications.   Procedure Name: MAC Date/Time: 08/30/2015 5:25 PM Performed by: Izora Gala Pre-anesthesia Checklist: Patient identified, Emergency Drugs available, Suction available and Patient being monitored Patient Re-evaluated:Patient Re-evaluated prior to inductionOxygen Delivery Method: Nasal cannula Preoxygenation: Pre-oxygenation with 100% oxygen Intubation Type: IV induction Placement Confirmation: positive ETCO2

## 2015-08-30 NOTE — H&P (Signed)
PREOPERATIVE H&P  Chief Complaint: Left radius and ulna fractures  HPI: [name] Ramos is a 82 y.o. female who presents for surgical treatment of Left radius and ulna fractures.  She denies any changes in medical history.  Past Medical History  Diagnosis Date  . Alzheimer disease   . Hypertension   . Diabetes mellitus without complication ([place])   . Arthritis   . Hearing impaired   . Cataracts, bilateral   . Recurrent falls   . Hemorrhoids   . Fecal impaction ([place]) 12/15/2012  . Protein-calorie malnutrition, severe ([place]) 12/16/2012  . Underweight 12/17/2012  . Anxiety disorder   . Chronic pain   . Heart block   . Peripheral vascular disease ([place])   . Chronic kidney disease, stage IV (severe) 12/14/2012   Past Surgical History  Procedure Laterality Date  . Tonsillectomy    . Appendectomy    . Cataract extraction, bilateral     Social History   Social History  . Marital Status: Married    Spouse Name: N/A  . Number of Children: N/A  . Years of Education: N/A   Social History Main Topics  . Smoking status: Never Smoker   . Smokeless tobacco: Never Used  . Alcohol Use: No  . Drug Use: No  . Sexual Activity: No   Other Topics Concern  . Not on file   Social History Narrative   Lives alone and uses rolling walker.    Family History  Problem Relation Age of Onset  . Colon cancer Neg Hx   . Breast cancer      multiple  . Dementia Sister   . High blood pressure      multiple  . Diabetes      multiple   Allergies  Allergen Reactions  . Ace Inhibitors Other (See Comments)    Reaction unknown  . Codeine Other (See Comments)    Reaction unknown   . Penicillins Other (See Comments)    Reaction unknown. Unable to answer penicillin questions due to pt not being able to provide answers did PCN reaction causing immediate rash, facial/tongue/throat swelling, SOB/lightheadedness with hypotension: unknown did PCN reaction causing severe rash involving  mucus membranes/skin necrosis: unknown did PCN reaction that required hospitalization : unknown did PCN reaction occurring within the last 10 years: unknown If all of the above answers are "NO", then may proceed with Cephalosporin use.   . Sulfa Antibiotics Other (See Comments)    Reaction unknown   . Tramadol Other (See Comments)    Reaction unknown    Prior to Admission medications   Medication Sig Start Date End Date Taking? Authorizing Provider  acetaminophen (TYLENOL) 325 MG tablet Take 650 mg by mouth every 6 (six) hours as needed for moderate pain.     Historical Provider, MD  amLODipine-valsartan (EXFORGE) 10-160 MG per tablet Take 1 tablet by mouth daily.     Historical Provider, MD  cholecalciferol (VITAMIN D) 1000 UNITS tablet Take 1,000 Units by mouth daily.     Historical Provider, MD  citalopram (CELEXA) 20 MG tablet Take 20 mg by mouth daily.    Historical Provider, MD  diclofenac sodium (VOLTAREN) 1 % GEL Apply 4 g topically 2 (two) times daily as needed (leg pain).     Historical Provider, MD  divalproex (DEPAKOTE SPRINKLE) 125 MG capsule Take 125-250 mg by mouth 2 (two) times daily. Order is written as takee 250mg  by mouth twice daily (8am and 8pm) and then another  order is written for 125mg  by mouth at night (8pm) for a total f 375 mg at night    Historical Provider, MD  ferrous sulfate 325 (65 FE) MG tablet Take 325 mg by mouth daily.     Historical Provider, MD  HYDROcodone-acetaminophen (NORCO/VICODIN) 5-325 MG tablet Take 1 tablet by mouth every 6 (six) hours as needed. 08/21/15   Lacretia Leigh, MD  lidocaine (LIDODERM) 5 % Place 1 patch onto the skin every 12 (twelve) hours. Remove & Discard patch within 12 hours or as directed by MD (apply every morning and remove every night    Historical Provider, MD  LORazepam (ATIVAN) 0.5 MG tablet Take 0.5 mg by mouth 2 (two) times daily. May take an additional 0.25mg s every 6 hours as needed for agitation 08/11/13   Historical  Provider, MD  Lorazepam POWD Apply 1 mg/mL topically every 8 (eight) hours as needed (for agitation (topical gel)).    Historical Provider, MD  menthol-cetylpyridinium (CEPACOL) 3 MG lozenge Take 1 lozenge by mouth daily as needed for sore throat.    Historical Provider, MD  metoprolol succinate (TOPROL-XL) 25 MG 24 hr tablet Take 12.5 mg by mouth daily.  08/09/13   Historical Provider, MD  ondansetron (ZOFRAN-ODT) 4 MG disintegrating tablet Take 1 tablet (4 mg total) by mouth every 8 (eight) hours as needed for nausea. 06/23/14   Varney Biles, MD  polyethylene glycol (MIRALAX / GLYCOLAX) packet Take 17 g by mouth daily as needed for mild constipation. Mix in 4 oz of water and drink    Historical Provider, MD  polyvinyl alcohol (LIQUIFILM TEARS) 1.4 % ophthalmic solution Place 2 drops into both eyes every 2 (two) hours as needed for dry eyes.    Historical Provider, MD  simethicone (MYLICON) 0000000 MG chewable tablet Chew 125 mg by mouth daily as needed for flatulence.     Historical Provider, MD  triamcinolone cream (KENALOG) 0.1 % Apply 1 application topically 3 (three) times daily as needed (dry skin).    Historical Provider, MD  vitamin C (ASCORBIC ACID) 500 MG tablet Take 500 mg by mouth daily.     Historical Provider, MD     Positive ROS: All other systems have been reviewed and were otherwise negative with the exception of those mentioned in the HPI and as above.  Physical Exam: General: Alert, no acute distress Cardiovascular: No pedal edema Respiratory: No cyanosis, no use of accessory musculature GI: abdomen soft Skin: No lesions in the area of chief complaint Neurologic: Sensation intact distally Psychiatric: Patient is competent for consent with normal mood and affect Lymphatic: no lymphedema  MUSCULOSKELETAL: exam stable  Assessment: Left radius and ulna fractures  Plan: Plan for Procedure(s): OPEN REDUCTION INTERNAL FIXATION (ORIF) LEFT RADIUS AND ULNA FRACTURES  The risks  benefits and alternatives were discussed with the patient including but not limited to the risks of nonoperative treatment, versus surgical intervention including infection, bleeding, nerve injury,  blood clots, cardiopulmonary complications, morbidity, mortality, among others, and they were willing to proceed.   Marianna Payment, MD   08/30/2015 8:09 AM

## 2015-08-30 NOTE — Transfer of Care (Signed)
Immediate Anesthesia Transfer of Care Note  Patient: [name] Ramos  Procedure(s) Performed: Procedure(s): OPEN REDUCTION INTERNAL FIXATION (ORIF) LEFT RADIUS AND ULNA FRACTURES (Left)  Patient Location: PACU  Anesthesia Type:MAC and Regional  Level of Consciousness: awake and patient cooperative  Airway & Oxygen Therapy: Patient Spontanous Breathing  Post-op Assessment: Report given to RN and Post -op Vital signs reviewed and stable  Post vital signs: Reviewed and stable  Last Vitals:  Filed Vitals:   08/30/15 1557  BP: 164/38  Pulse: 65  Temp: 36.6 C  Resp: 16    Last Pain: There were no vitals filed for this visit.       Complications: No apparent anesthesia complications

## 2015-08-30 NOTE — Anesthesia Preprocedure Evaluation (Addendum)
Anesthesia Evaluation  Patient identified by MRN, date of birth, ID band Patient confused    Reviewed: Allergy & Precautions, NPO status , Patient's Chart, lab work & pertinent test results, reviewed documented beta blocker date and time   Airway Mallampati: III  TM Distance: >3 FB Neck ROM: Full    Dental   Pulmonary neg pulmonary ROS,    breath sounds clear to auscultation       Cardiovascular hypertension, Pt. on medications and Pt. on home beta blockers + Peripheral Vascular Disease  + dysrhythmias  Rhythm:Regular Rate:Normal     Neuro/Psych Anxiety negative neurological ROS     GI/Hepatic negative GI ROS, Neg liver ROS,   Endo/Other  diabetes  Renal/GU CRFRenal disease     Musculoskeletal  (+) Arthritis ,   Abdominal   Peds  Hematology  (+) anemia ,   Anesthesia Other Findings   Reproductive/Obstetrics                           Lab Results  Component Value Date   WBC 5.2 08/09/2015   HGB 12.6 08/30/2015   HCT 37.0 08/30/2015   MCV 72.1* 08/09/2015   PLT 321 08/09/2015   Lab Results  Component Value Date   CREATININE 1.10* 08/30/2015   BUN 33* 08/30/2015   NA 133* 08/30/2015   K 5.2* 08/30/2015   CL 94* 08/30/2015   CO2 27 07/18/2015    Anesthesia Physical Anesthesia Plan  ASA: III  Anesthesia Plan: Regional and MAC   Post-op Pain Management:    Induction: Intravenous  Airway Management Planned: Natural Airway and Mask  Additional Equipment:   Intra-op Plan:   Post-operative Plan:   Informed Consent: I have reviewed the patients History and Physical, chart, labs and discussed the procedure including the risks, benefits and alternatives for the proposed anesthesia with the patient or authorized representative who has indicated his/her understanding and acceptance.     Plan Discussed with: CRNA  Anesthesia Plan Comments:        Anesthesia Quick  Evaluation

## 2015-08-30 NOTE — Anesthesia Postprocedure Evaluation (Signed)
Anesthesia Post Note  Patient: [name] Ramos  Procedure(s) Performed: Procedure(s) (LRB): OPEN REDUCTION INTERNAL FIXATION (ORIF) LEFT RADIUS AND ULNA FRACTURES (Left)  Patient location during evaluation: PACU Anesthesia Type: General and Regional Level of consciousness: awake, awake and alert and oriented Pain management: pain level controlled Vital Signs Assessment: post-procedure vital signs reviewed and stable Respiratory status: spontaneous breathing, nonlabored ventilation and respiratory function stable Cardiovascular status: blood pressure returned to baseline Anesthetic complications: no    Last Vitals:  Filed Vitals:   08/30/15 2030 08/30/15 2040  BP:    Pulse: 84 104  Temp: 36.4 C   Resp: 21 16    Last Pain: There were no vitals filed for this visit.               Josemaria Brining COKER

## 2015-08-30 NOTE — Discharge Instructions (Signed)
Postoperative instructions:  Weightbearing: non weight bearing  Dressing instructions: Keep your dressing and/or splint clean and dry at all times.  It will be removed at your first post-operative appointment.  Your stitches and/or staples will be removed at this visit.  Incision instructions:  Do not soak your incision for 3 weeks after surgery.  If the incision gets wet, pat dry and do not scrub the incision.  Pain control:  You have been given a prescription to be taken as directed for post-operative pain control.  In addition, elevate the operative extremity above the heart at all times to prevent swelling and throbbing pain.  Take over-the-counter Colace, 100mg  by mouth twice a day while taking narcotic pain medications to help prevent constipation.  Follow up appointments: 1) 10-14 days for suture removal and wound check. 2) Dr. Erlinda Hong as scheduled.   -------------------------------------------------------------------------------------------------------------  After Surgery Pain Control:  After your surgery, post-surgical discomfort or pain is likely. This discomfort can last several days to a few weeks. At certain times of the day your discomfort may be more intense.  Did you receive a nerve block?  A nerve block can provide pain relief for one hour to two days after your surgery. As long as the nerve block is working, you will experience little or no sensation in the area the surgeon operated on.  As the nerve block wears off, you will begin to experience pain or discomfort. It is very important that you begin taking your prescribed pain medication before the nerve block fully wears off. Treating your pain at the first sign of the block wearing off will ensure your pain is better controlled and more tolerable when full-sensation returns. Do not wait until the pain is intolerable, as the medicine will be less effective. It is better to treat pain in advance than to try and catch up.    General Anesthesia:  If you did not receive a nerve block during your surgery, you will need to start taking your pain medication shortly after your surgery and should continue to do so as prescribed by your surgeon.  Pain Medication:  Most commonly we prescribe Vicodin and Percocet for post-operative pain. Both of these medications contain a combination of acetaminophen (Tylenol) and a narcotic to help control pain.   It takes between 30 and 45 minutes before pain medication starts to work. It is important to take your medication before your pain level gets too intense.   Nausea is a common side effect of many pain medications. You will want to eat something before taking your pain medicine to help prevent nausea.   If you are taking a prescription pain medication that contains acetaminophen, we recommend that you do not take additional over the counter acetaminophen (Tylenol).  Other pain relieving options:   Using a cold pack to ice the affected area a few times a day (15 to 20 minutes at a time) can help to relieve pain, reduce swelling and bruising.   Elevation of the affected area can also help to reduce pain and swelling.

## 2015-08-30 NOTE — Progress Notes (Signed)
EMT Gwyndolyn Saxon arrived to PACU received report on pt and transported her back to [facility].

## 2015-08-30 NOTE — Progress Notes (Signed)
Report Called to Bradly Bienenstock, RN at [facility].  PTAR called and transportation has been initiated to return pt back to SNF.

## 2015-08-30 NOTE — H&P (Signed)

## 2015-08-30 NOTE — Progress Notes (Signed)
Son in Sports coach at bedside.  He is aware that pt is being transferred back to SNF tonight.  Will notify him when she is transferred.  Pt refused to take Norco tablet even when it was crushed and placed in Ice Cream.

## 2015-08-31 ENCOUNTER — Encounter (HOSPITAL_COMMUNITY): Payer: Self-pay | Admitting: Orthopaedic Surgery

## 2015-09-05 ENCOUNTER — Emergency Department (HOSPITAL_COMMUNITY): Payer: Medicare Other

## 2015-09-05 ENCOUNTER — Encounter (HOSPITAL_COMMUNITY): Payer: Self-pay

## 2015-09-05 ENCOUNTER — Emergency Department (HOSPITAL_COMMUNITY)
Admission: EM | Admit: 2015-09-05 | Discharge: 2015-09-05 | Disposition: A | Discharge status: Home / Self Care | Payer: Medicare Other | Attending: Emergency Medicine | Admitting: Emergency Medicine

## 2015-09-05 DIAGNOSIS — N184 Chronic kidney disease, stage 4 (severe): Secondary | ICD-10-CM | POA: Insufficient documentation

## 2015-09-05 DIAGNOSIS — Z7982 Long term (current) use of aspirin: Secondary | ICD-10-CM | POA: Insufficient documentation

## 2015-09-05 DIAGNOSIS — E119 Type 2 diabetes mellitus without complications: Secondary | ICD-10-CM | POA: Diagnosis not present

## 2015-09-05 DIAGNOSIS — Y999 Unspecified external cause status: Secondary | ICD-10-CM | POA: Diagnosis not present

## 2015-09-05 DIAGNOSIS — F0390 Unspecified dementia without behavioral disturbance: Secondary | ICD-10-CM | POA: Diagnosis present

## 2015-09-05 DIAGNOSIS — I129 Hypertensive chronic kidney disease with stage 1 through stage 4 chronic kidney disease, or unspecified chronic kidney disease: Secondary | ICD-10-CM | POA: Diagnosis not present

## 2015-09-05 DIAGNOSIS — Y9289 Other specified places as the place of occurrence of the external cause: Secondary | ICD-10-CM | POA: Diagnosis not present

## 2015-09-05 DIAGNOSIS — W050XXA Fall from non-moving wheelchair, initial encounter: Secondary | ICD-10-CM | POA: Diagnosis not present

## 2015-09-05 DIAGNOSIS — W19XXXA Unspecified fall, initial encounter: Secondary | ICD-10-CM

## 2015-09-05 DIAGNOSIS — Y939 Activity, unspecified: Secondary | ICD-10-CM | POA: Insufficient documentation

## 2015-09-05 NOTE — ED Notes (Signed)
Upon assessment, no injury noted to head or extremities

## 2015-09-05 NOTE — ED Notes (Signed)
Pt returned from x-ray and placed back on monitor.  

## 2015-09-05 NOTE — ED Provider Notes (Signed)
CSN: [id number]     Arrival date & time 09/05/15  0715 History   First MD Initiated Contact with Patient 09/05/15 0740     Chief Complaint  Patient presents with  . Fall     (Consider location/radiation/quality/duration/timing/severity/associated sxs/prior Treatment) HPI....Marland KitchenMarland KitchenLevel V caveat for dementia. Patient apparently fell out of her wheelchair today. She is in no obvious pain. She is difficult to evaluate secondary to her dementia. She has no complaints of pain. No prodromal illnesses. Status post fracture of left radius and ulna on 08/30/2015  Past Medical History  Diagnosis Date  . Alzheimer disease   . Hypertension   . Diabetes mellitus without complication ([place])   . Arthritis   . Hearing impaired   . Cataracts, bilateral   . Recurrent falls   . Hemorrhoids   . Fecal impaction ([place]) 12/15/2012  . Protein-calorie malnutrition, severe ([place]) 12/16/2012  . Underweight 12/17/2012  . Anxiety disorder   . Chronic pain   . Heart block   . Peripheral vascular disease ([place])   . Chronic kidney disease, stage IV (severe) 12/14/2012  . Anemia   . Vitamin B 12 deficiency   . Vitamin D deficiency   . Chronic constipation    Past Surgical History  Procedure Laterality Date  . Tonsillectomy    . Appendectomy    . Cataract extraction, bilateral    . Orif radial fracture Left 08/30/2015    Procedure: OPEN REDUCTION INTERNAL FIXATION (ORIF) LEFT RADIUS AND ULNA FRACTURES;  Surgeon: Leandrew Koyanagi, MD;  Location: [place];  Service: Orthopedics;  Laterality: Left;   Family History  Problem Relation Age of Onset  . Colon cancer Neg Hx   . Breast cancer      multiple  . Dementia Sister   . High blood pressure      multiple  . Diabetes      multiple   Social History  Substance Use Topics  . Smoking status: Never Smoker   . Smokeless tobacco: Never Used  . Alcohol Use: No   OB History    No data available     Review of Systems  Reason unable to perform ROS: dementia.       Allergies  Ace inhibitors; Codeine; Penicillins; Sulfa antibiotics; and Tramadol  Home Medications   Prior to Admission medications   Medication Sig Start Date End Date Taking? Authorizing Provider  acetaminophen (TYLENOL) 325 MG tablet Take 650 mg by mouth every 6 (six) hours as needed for moderate pain.    Yes Historical Provider, MD  amLODipine-valsartan (EXFORGE) 10-160 MG per tablet Take 1 tablet by mouth daily.    Yes Historical Provider, MD  aspirin EC 81 MG tablet Take 81 mg by mouth daily.   Yes Historical Provider, MD  cholecalciferol (VITAMIN D) 1000 UNITS tablet Take 1,000 Units by mouth daily.    Yes Historical Provider, MD  diclofenac sodium (VOLTAREN) 1 % GEL Apply 4 g topically 2 (two) times daily as needed (leg pain).    Yes Historical Provider, MD  divalproex (DEPAKOTE SPRINKLE) 125 MG capsule Take 125-250 mg by mouth 2 (two) times daily. Order is written as take 250mg  by mouth twice daily (8am and 8pm) and then another order is written for 125mg  by mouth at night (8pm) for a total of 375 mg at night   Yes Historical Provider, MD  ferrous sulfate 325 (65 FE) MG tablet Take 325 mg by mouth daily.    Yes Historical Provider, MD  HYDROcodone-acetaminophen (  NORCO) 5-325 MG tablet Take 1-2 tablets by mouth every 6 (six) hours as needed. 08/30/15  Yes Naiping Ephriam Jenkins, MD  lidocaine (LIDODERM) 5 % Place 1 patch onto the skin every 12 (twelve) hours. Remove & Discard patch within 12 hours or as directed by MD (apply every morning and remove every night   Yes Historical Provider, MD  LORazepam (ATIVAN) 0.5 MG tablet Take 0.5 mg by mouth 2 (two) times daily. May take an additional 0.25mg s every 6 hours as needed for agitation 08/11/13  Yes Historical Provider, MD  menthol-cetylpyridinium (CEPACOL) 3 MG lozenge Take 1 lozenge by mouth daily as needed for sore throat.   Yes Historical Provider, MD  metoprolol succinate (TOPROL-XL) 25 MG 24 hr tablet Take 12.5 mg by mouth daily.  08/09/13   Yes Historical Provider, MD  ondansetron (ZOFRAN-ODT) 4 MG disintegrating tablet Take 1 tablet (4 mg total) by mouth every 8 (eight) hours as needed for nausea. 06/23/14  Yes Varney Biles, MD  polyethylene glycol (MIRALAX / GLYCOLAX) packet Take 17 g by mouth daily as needed for mild constipation. Mix in 4 oz of water and drink   Yes Historical Provider, MD  polyvinyl alcohol (LIQUIFILM TEARS) 1.4 % ophthalmic solution Place 2 drops into both eyes every 2 (two) hours as needed for dry eyes.   Yes Historical Provider, MD  sertraline (ZOLOFT) 50 MG tablet Take 75 mg by mouth daily.   Yes Historical Provider, MD  simethicone (MYLICON) 0000000 MG chewable tablet Chew 125 mg by mouth daily as needed for flatulence.    Yes Historical Provider, MD  triamcinolone cream (KENALOG) 0.1 % Apply 1 application topically 3 (three) times daily as needed (dry skin).   Yes Historical Provider, MD  vitamin C (ASCORBIC ACID) 500 MG tablet Take 500 mg by mouth daily.    Yes Historical Provider, MD   BP 161/51 mmHg  Pulse 66  Temp(Src) 97.5 F (36.4 C) (Oral)  Resp 16  Ht 5\' 3"  (1.6 m)  Wt 100 lb (45.36 kg)  BMI 17.72 kg/m2  SpO2 100% Physical Exam  Constitutional:  Frail, demented, states no pain  HENT:  Head: Normocephalic and atraumatic.  Eyes: Conjunctivae are normal.  Neck: Neck supple.  Cardiovascular: Normal rate and regular rhythm.   Pulmonary/Chest: Effort normal and breath sounds normal.  Abdominal: Soft. Bowel sounds are normal.  Musculoskeletal:  Left forearm immobilized  Neurological: She is alert.  Skin: Skin is warm and dry.  Psychiatric:  Demented  Nursing note and vitals reviewed.   ED Course  Procedures (including critical care time) Labs Review Labs Reviewed - No data to display  Imaging Review Dg Shoulder Right  09/05/2015  CLINICAL DATA:  Fall with right shoulder pain. EXAM: RIGHT SHOULDER - 2+ VIEW COMPARISON:  04/10/2015 FINDINGS: There are degenerative changes of the [facility]  joint and glenohumeral joints. Severe narrowing of the acromial humeral joint space unchanged compatible with chronic rotator cuff tear. No acute fracture or dislocation. 1.2 cm loose body posterior to the humeral head unchanged. IMPRESSION: No acute findings. Degenerative changes of the [facility] joint and glenohumeral joints. 1.2 cm loose body posterior to the humeral head unchanged. Evidence of chronic rotator cuff tear. Electronically Signed   By: Marin Olp M.D.   On: 09/05/2015 09:39   Ct Head Wo Contrast  09/05/2015  CLINICAL DATA:  82 year old female with unwitnessed fall in bathroom. Hypertension, diabetes and dementia. Subsequent encounter. EXAM: CT HEAD WITHOUT CONTRAST TECHNIQUE: Contiguous axial images were obtained  from the base of the skull through the vertex without intravenous contrast. COMPARISON:  08/21/2015 CT. FINDINGS: No skull fracture or intracranial hemorrhage. Chronic microvascular changes without CT evidence of large acute infarct. Moderate global atrophy with ventricular prominence similar to prior exam and probably related to central atrophy. No intracranial mass lesion noted on this unenhanced exam. Vascular calcifications. No acute orbital abnormality. Mastoid air cells, middle ear cavities and visualized paranasal sinuses are clear. IMPRESSION: No acute intracranial abnormality. Electronically Signed   By: Genia Del M.D.   On: 09/05/2015 09:58   I have personally reviewed and evaluated these images and lab results as part of my medical decision-making.   EKG Interpretation None      MDM   Final diagnoses:  Fall, initial encounter    Patient appears to be at baseline. Vital signs are stable. CT head shows no subdural hematoma. Plain films of right shoulder negative.    Nat Christen, MD 09/05/15 1027

## 2015-09-05 NOTE — ED Notes (Signed)
[employer] - Transporter here to take pt to x-ray.

## 2015-09-05 NOTE — Discharge Instructions (Signed)
CT head and x-ray right shoulder were negative. Follow-up your primary care doctor.

## 2015-09-05 NOTE — ED Notes (Signed)
Pt. Being D/C back to [facility] by PTAR. Pt dressed and Appears stable and showing no signs of pain. Facesheet. Med necessities, and transfer paperwork given to PTAR.

## 2015-09-05 NOTE — ED Notes (Signed)
Per [facility], Pt was found down in facility in the bathroom. Facility reports patient normally uses wheelchair to get around. Pt was found on her right side with complaints of right shoulder pain. No deformity noted. No deformity noted to hip or other extremities. Pt has no bruising or injury present upon assessment. Facility reports patient is at her baseline. Unsure about verbal status, pt will respond with nods and "groans." Opened eyes to name. Vitals per EMS: 163/73, 66 HR, 22 RR, 98% on RA, 101 CBG

## 2015-09-19 ENCOUNTER — Encounter (HOSPITAL_COMMUNITY): Payer: Self-pay

## 2015-09-19 ENCOUNTER — Emergency Department (HOSPITAL_COMMUNITY)
Admission: EM | Admit: 2015-09-19 | Discharge: 2015-09-19 | Disposition: A | Discharge status: Home / Self Care | Payer: Medicare Other | Attending: Emergency Medicine | Admitting: Emergency Medicine

## 2015-09-19 ENCOUNTER — Emergency Department (HOSPITAL_COMMUNITY): Payer: Medicare Other

## 2015-09-19 DIAGNOSIS — Y92129 Unspecified place in nursing home as the place of occurrence of the external cause: Secondary | ICD-10-CM | POA: Diagnosis not present

## 2015-09-19 DIAGNOSIS — Z79899 Other long term (current) drug therapy: Secondary | ICD-10-CM | POA: Insufficient documentation

## 2015-09-19 DIAGNOSIS — M79632 Pain in left forearm: Secondary | ICD-10-CM | POA: Diagnosis present

## 2015-09-19 DIAGNOSIS — N184 Chronic kidney disease, stage 4 (severe): Secondary | ICD-10-CM | POA: Diagnosis not present

## 2015-09-19 DIAGNOSIS — Y999 Unspecified external cause status: Secondary | ICD-10-CM | POA: Diagnosis not present

## 2015-09-19 DIAGNOSIS — R296 Repeated falls: Secondary | ICD-10-CM | POA: Diagnosis not present

## 2015-09-19 DIAGNOSIS — W19XXXA Unspecified fall, initial encounter: Secondary | ICD-10-CM | POA: Diagnosis not present

## 2015-09-19 DIAGNOSIS — I129 Hypertensive chronic kidney disease with stage 1 through stage 4 chronic kidney disease, or unspecified chronic kidney disease: Secondary | ICD-10-CM | POA: Diagnosis not present

## 2015-09-19 DIAGNOSIS — G309 Alzheimer's disease, unspecified: Secondary | ICD-10-CM | POA: Insufficient documentation

## 2015-09-19 DIAGNOSIS — Y939 Activity, unspecified: Secondary | ICD-10-CM | POA: Diagnosis not present

## 2015-09-19 DIAGNOSIS — E1122 Type 2 diabetes mellitus with diabetic chronic kidney disease: Secondary | ICD-10-CM | POA: Diagnosis not present

## 2015-09-19 DIAGNOSIS — Z7982 Long term (current) use of aspirin: Secondary | ICD-10-CM | POA: Insufficient documentation

## 2015-09-19 DIAGNOSIS — E1151 Type 2 diabetes mellitus with diabetic peripheral angiopathy without gangrene: Secondary | ICD-10-CM | POA: Insufficient documentation

## 2015-09-19 NOTE — ED Provider Notes (Signed)
[facility] [facility] DEPT Provider Note   CSN: [id number] Arrival date & time: 09/19/15  [id number]  First Provider Contact:  First MD Initiated Contact with Patient 09/19/15 [phone]        History   Chief Complaint Chief Complaint  Patient presents with  . Fall    HPI [name] Ramos is a 82 y.o. female.  HPI  82 year old female with a history of Alzheimer's, hypertension, diabetes, recurrent falls, recent surgical repair of left radius and ulna fracture, presents with concern for unwitnessed fall from [place]. Discussed with facility, and patient hasn't had no other changes in behavior and no fever. Patient has a history of multiple falls. She reports pain in her left forearm, however denies any other areas of pain, although history is limited by her dementia. Not on anticoagulation. Multiple past CTs reviewed and negative. Patient was found next to her wheelchair. Facility reports that she frequently forgets she is unable to ambulate independently, and gets up and falls. Patient is otherwise at baseline when she is found. Patient denies areas of pain on history or during exam except for around her left forearm. Patient recently had surgery on this area. Per report, patient's POA did not want her transported to the emergency department, however was unable to sign a form, and patient was brought here.   Past Medical History:  Diagnosis Date  . Alzheimer disease   . Anemia   . Anxiety disorder   . Arthritis   . Cataracts, bilateral   . Chronic constipation   . Chronic kidney disease, stage IV (severe) 12/14/2012  . Chronic pain   . Diabetes mellitus without complication ([place])   . Fecal impaction ([place]) 12/15/2012  . Hearing impaired   . Heart block   . Hemorrhoids   . Hypertension   . Peripheral vascular disease ([place])   . Protein-calorie malnutrition, severe ([place]) 12/16/2012  . Recurrent falls   . Underweight 12/17/2012  . Vitamin B 12 deficiency   . Vitamin D deficiency      Patient Active Problem List   Diagnosis Date Noted  . SVT (supraventricular tachycardia) ([place]) 08/12/2013  . Anxiety state, unspecified 02/01/2013  . Essential thrombocythemia ([place]) 02/01/2013  . Unspecified constipation 01/21/2013  . Secondary renovascular hypertension, benign 01/18/2013  . Anemia in chronic kidney disease(285.21) 01/18/2013  . Underweight 12/17/2012  . Protein-calorie malnutrition, severe ([place]) 12/16/2012  . Fecal impaction ([place]) 12/15/2012  . Chronic kidney disease, stage IV (severe) ([place]) 12/14/2012  . Alzheimer disease   . Hypertension   . Arthritis   . Hemorrhoids     Past Surgical History:  Procedure Laterality Date  . APPENDECTOMY    . CATARACT EXTRACTION, BILATERAL    . ORIF RADIAL FRACTURE Left 08/30/2015   Procedure: OPEN REDUCTION INTERNAL FIXATION (ORIF) LEFT RADIUS AND ULNA FRACTURES;  Surgeon: Leandrew Koyanagi, MD;  Location: [place];  Service: Orthopedics;  Laterality: Left;  . TONSILLECTOMY      OB History    No data available       Home Medications    Prior to Admission medications   Medication Sig Start Date End Date Taking? Authorizing Provider  acetaminophen (TYLENOL) 325 MG tablet Take 650 mg by mouth every 6 (six) hours as needed for moderate pain.     Historical Provider, MD  amLODipine-valsartan (EXFORGE) 10-160 MG per tablet Take 1 tablet by mouth daily.     Historical Provider, MD  aspirin EC 81 MG tablet Take 81 mg by mouth daily.  Historical Provider, MD  cholecalciferol (VITAMIN D) 1000 UNITS tablet Take 1,000 Units by mouth daily.     Historical Provider, MD  diclofenac sodium (VOLTAREN) 1 % GEL Apply 4 g topically 2 (two) times daily as needed (leg pain).     Historical Provider, MD  divalproex (DEPAKOTE SPRINKLE) 125 MG capsule Take 125-250 mg by mouth 2 (two) times daily. Order is written as take 250mg  by mouth twice daily (8am and 8pm) and then another order is written for 125mg  by mouth at night (8pm) for a total of 375  mg at night    Historical Provider, MD  ferrous sulfate 325 (65 FE) MG tablet Take 325 mg by mouth daily.     Historical Provider, MD  HYDROcodone-acetaminophen (NORCO) 5-325 MG tablet Take 1-2 tablets by mouth every 6 (six) hours as needed. 08/30/15   Naiping Ephriam Jenkins, MD  lidocaine (LIDODERM) 5 % Place 1 patch onto the skin every 12 (twelve) hours. Remove & Discard patch within 12 hours or as directed by MD (apply every morning and remove every night    Historical Provider, MD  LORazepam (ATIVAN) 0.5 MG tablet Take 0.5 mg by mouth 2 (two) times daily. May take an additional 0.25mg s every 6 hours as needed for agitation 08/11/13   Historical Provider, MD  menthol-cetylpyridinium (CEPACOL) 3 MG lozenge Take 1 lozenge by mouth daily as needed for sore throat.    Historical Provider, MD  metoprolol succinate (TOPROL-XL) 25 MG 24 hr tablet Take 12.5 mg by mouth daily.  08/09/13   Historical Provider, MD  ondansetron (ZOFRAN-ODT) 4 MG disintegrating tablet Take 1 tablet (4 mg total) by mouth every 8 (eight) hours as needed for nausea. 06/23/14   Varney Biles, MD  polyethylene glycol (MIRALAX / GLYCOLAX) packet Take 17 g by mouth daily as needed for mild constipation. Mix in 4 oz of water and drink    Historical Provider, MD  polyvinyl alcohol (LIQUIFILM TEARS) 1.4 % ophthalmic solution Place 2 drops into both eyes every 2 (two) hours as needed for dry eyes.    Historical Provider, MD  sertraline (ZOLOFT) 50 MG tablet Take 75 mg by mouth daily.    Historical Provider, MD  simethicone (MYLICON) 0000000 MG chewable tablet Chew 125 mg by mouth daily as needed for flatulence.     Historical Provider, MD  triamcinolone cream (KENALOG) 0.1 % Apply 1 application topically 3 (three) times daily as needed (dry skin).    Historical Provider, MD  vitamin C (ASCORBIC ACID) 500 MG tablet Take 500 mg by mouth daily.     Historical Provider, MD    Family History Family History  Problem Relation Age of Onset  . Breast cancer       multiple  . Dementia Sister   . High blood pressure      multiple  . Diabetes      multiple  . Colon cancer Neg Hx     Social History Social History  Substance Use Topics  . Smoking status: Never Smoker  . Smokeless tobacco: Never Used  . Alcohol use No     Allergies   Ace inhibitors; Codeine; Penicillins; Sulfa antibiotics; and Tramadol   Review of Systems Review of Systems  Unable to perform ROS: Dementia  Constitutional: Negative for fever.  Gastrointestinal: Negative for vomiting.  Musculoskeletal: Positive for arthralgias.     Physical Exam Updated Vital Signs BP 159/58 (BP Location: Left Arm)   Pulse 64   Temp 97.8 F (36.6  C) (Oral)   Resp 18   SpO2 99%   Physical Exam  Constitutional: She appears well-developed and well-nourished. No distress.  HENT:  Head: Normocephalic and atraumatic.  Right Ear: No hemotympanum.  Left Ear: No hemotympanum.  Mouth/Throat: Oropharynx is clear and moist. No trismus in the jaw.  Eyes: Conjunctivae and EOM are normal. Pupils are equal, round, and reactive to light.  Neck: Normal range of motion.  Cardiovascular: Normal rate, regular rhythm, normal heart sounds and intact distal pulses.  Exam reveals no gallop and no friction rub.   No murmur heard. Pulmonary/Chest: Effort normal and breath sounds normal. No respiratory distress. She has no wheezes. She has no rales.  Abdominal: Soft. She exhibits no distension. There is no tenderness. There is no guarding.  Musculoskeletal: She exhibits no edema.       Right hip: She exhibits no bony tenderness.       Left hip: She exhibits no bony tenderness.       Cervical back: She exhibits no tenderness and no bony tenderness.       Left forearm: She exhibits tenderness and swelling.  Healing incisions left forearm, C/D/I sutures intact, no surrounding erythema, no fluctuance  Neurological: GCS eye subscore is 3. GCS verbal subscore is 4. GCS motor subscore is 6.   Intermittently follows commands Squeezes hands, holds legs up when lifted bilaterally, tongue midline, PERRL  Skin: Skin is warm and dry. No rash noted. She is not diaphoretic. No erythema.  Nursing note and vitals reviewed.    ED Treatments / Results  Labs (all labs ordered are listed, but only abnormal results are displayed) Labs Reviewed - No data to display  EKG  EKG Interpretation None       Radiology No results found.  Procedures Procedures (including critical care time)  Medications Ordered in ED Medications - No data to display   Initial Impression / Assessment and Plan / ED Course  I have reviewed the triage vital signs and the nursing notes.  Pertinent labs & imaging results that were available during my care of the patient were reviewed by me and considered in my medical decision making (see chart for details).  Clinical Course   82 year old female with a history of Alzheimer's, hypertension, diabetes, recurrent falls, recent surgical repair of left radius and ulna fracture, presents with concern for unwitnessed fall from [place]. Discussed with facility, and patient hasn't had no other changes in behavior and no fever. Patient has a history of multiple falls. She reports pain in her left forearm, however denies any other areas of pain, although history is limited by her dementia. There are no signs of head trauma on exam, no battle sign, no raccoon's eyes, no sign of head trauma, no hemotympanum, no tenderness around the skull or cervical spine, patient is able to move all 4 extremities, and have low suspicion at this time for intracranial or cervical injury. Given patient reports left arm pain, and had recent surgery, XR repeated showing surgical fixation without acute abnormality.  Patient otherwise at baseline, normal vital signs, appropriate for outpatient follow up, no other areas of pain or tenderness on exam. Patient discharged in stable condition  with understanding of reasons to return.   Final Clinical Impressions(s) / ED Diagnoses   Final diagnoses:  None    New Prescriptions New Prescriptions   No medications on file     Gareth Morgan, MD 09/20/15 [phone]

## 2015-09-19 NOTE — ED Triage Notes (Addendum)
Patient arrived via [facility] from [place]. Patient had unwitnessed fall this morning per staff report. Was found in her room in sitting position on the floor by staff.  Upon EMS arrival patient has no obvious signs of trauma and reports no complaints. Per EMS Patient's POA did not want transport to the hospital but POA was unable to sign a refusal form so facility required transport.

## 2015-09-19 NOTE — ED Notes (Signed)
Bed: WA15 Expected date:  Expected time:  Means of arrival:  Comments: EMS 82 yo fall 

## 2015-09-19 NOTE — Progress Notes (Signed)
Pt with [facility] ED visits x 15 with no admissions Pt residing at [facility] Recent 08/30/15 surgery for Open reduction internal fixation (ORIF) left radius and ulna fractures  09/19/15 ED visit for fall Pt with recurrent falls with ED visits  No ED CP Not eligible for [facility]  pcp samuel bowen

## 2015-09-19 NOTE — ED Notes (Signed)
Patient awoke asking for her mother.  Attempted to reorient patient.

## 2015-10-06 ENCOUNTER — Encounter (HOSPITAL_COMMUNITY): Payer: Self-pay

## 2015-10-06 ENCOUNTER — Emergency Department (HOSPITAL_COMMUNITY)
Admission: EM | Admit: 2015-10-06 | Discharge: 2015-10-06 | Disposition: A | Discharge status: Home / Self Care | Payer: Medicare Other | Attending: Emergency Medicine | Admitting: Emergency Medicine

## 2015-10-06 ENCOUNTER — Emergency Department (HOSPITAL_COMMUNITY): Payer: Medicare Other

## 2015-10-06 DIAGNOSIS — Y939 Activity, unspecified: Secondary | ICD-10-CM | POA: Insufficient documentation

## 2015-10-06 DIAGNOSIS — Y92121 Bathroom in nursing home as the place of occurrence of the external cause: Secondary | ICD-10-CM | POA: Diagnosis not present

## 2015-10-06 DIAGNOSIS — I129 Hypertensive chronic kidney disease with stage 1 through stage 4 chronic kidney disease, or unspecified chronic kidney disease: Secondary | ICD-10-CM | POA: Diagnosis not present

## 2015-10-06 DIAGNOSIS — Y999 Unspecified external cause status: Secondary | ICD-10-CM | POA: Insufficient documentation

## 2015-10-06 DIAGNOSIS — F0390 Unspecified dementia without behavioral disturbance: Secondary | ICD-10-CM | POA: Insufficient documentation

## 2015-10-06 DIAGNOSIS — Z79899 Other long term (current) drug therapy: Secondary | ICD-10-CM | POA: Insufficient documentation

## 2015-10-06 DIAGNOSIS — S0990XA Unspecified injury of head, initial encounter: Secondary | ICD-10-CM | POA: Diagnosis present

## 2015-10-06 DIAGNOSIS — N184 Chronic kidney disease, stage 4 (severe): Secondary | ICD-10-CM | POA: Insufficient documentation

## 2015-10-06 DIAGNOSIS — E1122 Type 2 diabetes mellitus with diabetic chronic kidney disease: Secondary | ICD-10-CM | POA: Diagnosis not present

## 2015-10-06 DIAGNOSIS — Y92129 Unspecified place in nursing home as the place of occurrence of the external cause: Secondary | ICD-10-CM

## 2015-10-06 DIAGNOSIS — W19XXXA Unspecified fall, initial encounter: Secondary | ICD-10-CM

## 2015-10-06 DIAGNOSIS — W1809XA Striking against other object with subsequent fall, initial encounter: Secondary | ICD-10-CM | POA: Diagnosis not present

## 2015-10-06 NOTE — ED Notes (Signed)
Bed: [id number] Expected date:  Expected time:  Means of arrival:  Comments: EMS 82 yo female fall/hit head

## 2015-10-06 NOTE — ED Notes (Signed)
Per EMS- Came from [facility] after unwitnessed fall when going to bathroom. Stated that she hit the back of head on bed. No LOC. Bump on back left side of head. Not on blood thinners.

## 2015-10-06 NOTE — ED Provider Notes (Signed)
[facility] DEPT Provider Note   CSN: [id number] Arrival date & time: 10/06/15  0140  By signing my name below, I, Jasmyn B. Alexander, attest that this documentation has been prepared under the direction and in the presence of Delora Fuel, MD. Electronically Signed: Tedra Coupe. Sheppard Coil, ED Scribe. 10/06/15. 3:37 AM.  History   Chief Complaint Chief Complaint  Patient presents with  . Fall  . Head Injury   LEVEL V CAVEAT - DEMENTIA  HPI HPI Comments: [name] Ramos is a 82 y.o. female brought in by ambulance, with PMhx of Alzheimer's disease, DM, hypertension, and recurrent falls who presents to the Emergency Department complaining of sudden onset head injury s/p ground-level unwitnessed fall that occurred PTA. Per EMS, pt was transported from [facility] assisted living facility and fell while attempting to go to the bathroom. She hit her head on the bed frame but no LOC. Patient is not on any anticoagulants. Pt does not have any complaints at this time and mildly confused due to Alzheimer's dementia.  The history is provided by the patient and the EMS personnel. No language interpreter was used.   Past Medical History:  Diagnosis Date  . Alzheimer disease   . Anemia   . Anxiety disorder   . Arthritis   . Cataracts, bilateral   . Chronic constipation   . Chronic kidney disease, stage IV (severe) 12/14/2012  . Chronic pain   . Diabetes mellitus without complication ([place])   . Fecal impaction ([place]) 12/15/2012  . Hearing impaired   . Heart block   . Hemorrhoids   . Hypertension   . Peripheral vascular disease ([place])   . Protein-calorie malnutrition, severe ([place]) 12/16/2012  . Recurrent falls   . Underweight 12/17/2012  . Vitamin B 12 deficiency   . Vitamin D deficiency     Patient Active Problem List   Diagnosis Date Noted  . SVT (supraventricular tachycardia) ([place]) 08/12/2013  . Anxiety state, unspecified 02/01/2013  . Essential thrombocythemia ([place])  02/01/2013  . Unspecified constipation 01/21/2013  . Secondary renovascular hypertension, benign 01/18/2013  . Anemia in chronic kidney disease(285.21) 01/18/2013  . Underweight 12/17/2012  . Protein-calorie malnutrition, severe ([place]) 12/16/2012  . Fecal impaction ([place]) 12/15/2012  . Chronic kidney disease, stage IV (severe) ([place]) 12/14/2012  . Alzheimer disease   . Hypertension   . Arthritis   . Hemorrhoids     Past Surgical History:  Procedure Laterality Date  . APPENDECTOMY    . CATARACT EXTRACTION, BILATERAL    . ORIF RADIAL FRACTURE Left 08/30/2015   Procedure: OPEN REDUCTION INTERNAL FIXATION (ORIF) LEFT RADIUS AND ULNA FRACTURES;  Surgeon: Leandrew Koyanagi, MD;  Location: [facility];  Service: Orthopedics;  Laterality: Left;  . TONSILLECTOMY      OB History    No data available       Home Medications    Prior to Admission medications   Medication Sig Start Date End Date Taking? Authorizing Provider  acetaminophen (TYLENOL) 325 MG tablet Take 650 mg by mouth every 6 (six) hours as needed for moderate pain.     Historical Provider, MD  amLODipine-valsartan (EXFORGE) 10-160 MG per tablet Take 1 tablet by mouth daily.     Historical Provider, MD  aspirin EC 81 MG tablet Take 81 mg by mouth daily.    Historical Provider, MD  cholecalciferol (VITAMIN D) 1000 UNITS tablet Take 1,000 Units by mouth daily.     Historical Provider, MD  diclofenac sodium (VOLTAREN) 1 % GEL  Apply 4 g topically 2 (two) times daily as needed (leg pain).     Historical Provider, MD  divalproex (DEPAKOTE SPRINKLE) 125 MG capsule Take 125-250 mg by mouth 2 (two) times daily. Order is written as take 250mg  by mouth twice daily (8am and 8pm) and then another order is written for 125mg  by mouth at night (8pm) for a total of 375 mg at night    Historical Provider, MD  ferrous sulfate 325 (65 FE) MG tablet Take 325 mg by mouth daily.     Historical Provider, MD  HYDROcodone-acetaminophen (NORCO) 5-325 MG tablet Take  1-2 tablets by mouth every 6 (six) hours as needed. 08/30/15   Naiping Ephriam Jenkins, MD  lidocaine (LIDODERM) 5 % Place 1 patch onto the skin every 12 (twelve) hours. Remove & Discard patch within 12 hours or as directed by MD (apply every morning and remove every night    Historical Provider, MD  LORazepam (ATIVAN) 0.5 MG tablet Take 0.5 mg by mouth 2 (two) times daily. May take an additional 0.25mg s every 6 hours as needed for agitation 08/11/13   Historical Provider, MD  menthol-cetylpyridinium (CEPACOL) 3 MG lozenge Take 1 lozenge by mouth daily as needed for sore throat.    Historical Provider, MD  metoprolol succinate (TOPROL-XL) 25 MG 24 hr tablet Take 12.5 mg by mouth daily.  08/09/13   Historical Provider, MD  ondansetron (ZOFRAN-ODT) 4 MG disintegrating tablet Take 1 tablet (4 mg total) by mouth every 8 (eight) hours as needed for nausea. 06/23/14   Varney Biles, MD  polyethylene glycol (MIRALAX / GLYCOLAX) packet Take 17 g by mouth daily as needed for mild constipation. Mix in 4 oz of water and drink    Historical Provider, MD  polyvinyl alcohol (LIQUIFILM TEARS) 1.4 % ophthalmic solution Place 2 drops into both eyes every 2 (two) hours as needed for dry eyes.    Historical Provider, MD  sertraline (ZOLOFT) 50 MG tablet Take 75 mg by mouth daily.    Historical Provider, MD  simethicone (MYLICON) 0000000 MG chewable tablet Chew 125 mg by mouth daily as needed for flatulence.     Historical Provider, MD  triamcinolone cream (KENALOG) 0.1 % Apply 1 application topically 3 (three) times daily as needed (dry skin).    Historical Provider, MD  vitamin C (ASCORBIC ACID) 500 MG tablet Take 500 mg by mouth daily.     Historical Provider, MD    Family History Family History  Problem Relation Age of Onset  . Breast cancer      multiple  . Dementia Sister   . High blood pressure      multiple  . Diabetes      multiple  . Colon cancer Neg Hx     Social History Social History  Substance Use Topics  .  Smoking status: Never Smoker  . Smokeless tobacco: Never Used  . Alcohol use No     Allergies   Ace inhibitors; Codeine; Penicillins; Sulfa antibiotics; and Tramadol   Review of Systems Review of Systems  Unable to perform ROS: Dementia   Physical Exam Updated Vital Signs BP 144/75 (BP Location: Left Arm)   Pulse 79   Temp 98.2 F (36.8 C)   Resp 16   SpO2 100%   Physical Exam  Constitutional: She appears well-developed and well-nourished.  HENT:  Head: Normocephalic and atraumatic.  Eyes: EOM are normal. Pupils are equal, round, and reactive to light.  Neck: Normal range of motion. Neck  supple. No JVD present.  Cardiovascular: Normal rate, regular rhythm and normal heart sounds.   No murmur heard. Pulmonary/Chest: Effort normal and breath sounds normal. She has no wheezes. She has no rales. She exhibits no tenderness.  Abdominal: Soft. Bowel sounds are normal. She exhibits no distension and no mass. There is no tenderness.  Musculoskeletal: Normal range of motion. She exhibits no edema.  Lymphadenopathy:    She has no cervical adenopathy.  Neurological: She is alert. No cranial nerve deficit. Coordination normal.  Alert and oriented to person and place but not time. Moderate cogwheel rigidty. Inappropriate laughter  Skin: Skin is warm and dry. No rash noted.  Psychiatric: She has a normal mood and affect. Her behavior is normal. Judgment and thought content normal.  Nursing note and vitals reviewed.  ED Treatments / Results  DIAGNOSTIC STUDIES: Oxygen Saturation is 100% on RA, normal by my interpretation.    COORDINATION OF CARE: 3:30 AM-Discussed treatment plan which includes CT head and C-spine.   Labs (all labs ordered are listed, but only abnormal results are displayed) Labs Reviewed - No data to display  Radiology No results found.  Procedures Procedures (including critical care time)  Medications Ordered in ED Medications - No data to  display  Initial Impression / Assessment and Plan / ED Course  I have reviewed the triage vital signs and the nursing notes.  Pertinent labs & imaging results that were available during my care of the patient were reviewed by me and considered in my medical decision making (see chart for details).  Clinical Course    Fall without apparent injury. She she is sent for CT of head and cervical spine which is significant for degenerative changes in the cervical spine, no acute injury. Old records are reviewed, and she has no relevant past visits.  Final Clinical Impressions(s) / ED Diagnoses   Final diagnoses:  Fall at nursing home, initial encounter    New Prescriptions New Prescriptions   No medications on file   I personally performed the services described in this documentation, which was scribed in my presence. The recorded information has been reviewed and is accurate.       Delora Fuel, MD [phone]

## 2015-10-18 ENCOUNTER — Emergency Department (HOSPITAL_COMMUNITY)
Admission: EM | Admit: 2015-10-18 | Discharge: 2015-10-18 | Disposition: A | Discharge status: Home / Self Care | Payer: Medicare Other | Attending: Emergency Medicine | Admitting: Emergency Medicine

## 2015-10-18 ENCOUNTER — Emergency Department (HOSPITAL_COMMUNITY): Payer: Medicare Other

## 2015-10-18 ENCOUNTER — Encounter (HOSPITAL_COMMUNITY): Payer: Self-pay

## 2015-10-18 DIAGNOSIS — N184 Chronic kidney disease, stage 4 (severe): Secondary | ICD-10-CM | POA: Diagnosis not present

## 2015-10-18 DIAGNOSIS — E1122 Type 2 diabetes mellitus with diabetic chronic kidney disease: Secondary | ICD-10-CM | POA: Diagnosis not present

## 2015-10-18 DIAGNOSIS — Z79899 Other long term (current) drug therapy: Secondary | ICD-10-CM | POA: Diagnosis not present

## 2015-10-18 DIAGNOSIS — Y999 Unspecified external cause status: Secondary | ICD-10-CM | POA: Insufficient documentation

## 2015-10-18 DIAGNOSIS — W19XXXA Unspecified fall, initial encounter: Secondary | ICD-10-CM | POA: Diagnosis not present

## 2015-10-18 DIAGNOSIS — M25511 Pain in right shoulder: Secondary | ICD-10-CM | POA: Insufficient documentation

## 2015-10-18 DIAGNOSIS — Y929 Unspecified place or not applicable: Secondary | ICD-10-CM | POA: Diagnosis not present

## 2015-10-18 DIAGNOSIS — Y939 Activity, unspecified: Secondary | ICD-10-CM | POA: Insufficient documentation

## 2015-10-18 DIAGNOSIS — F0390 Unspecified dementia without behavioral disturbance: Secondary | ICD-10-CM | POA: Diagnosis not present

## 2015-10-18 DIAGNOSIS — I129 Hypertensive chronic kidney disease with stage 1 through stage 4 chronic kidney disease, or unspecified chronic kidney disease: Secondary | ICD-10-CM | POA: Insufficient documentation

## 2015-10-18 NOTE — ED Triage Notes (Signed)
Per [facility] Pt resides at [facility]. Unwitnessed fall. ? LOC. Pt found supine on bathroom floor. No trauma noted post fall. Towel roll around neck for spinal support. HX of dementia. Pt c/o of rt arm, shoulder pain and mouth pain. HX of frequent fa;;s.

## 2015-10-18 NOTE — ED Notes (Signed)
Bed: [facility] Expected date:  Expected time:  Means of arrival:  Comments: EMS- 47s F, fall/arm pain/"something wrong with her mouth"

## 2015-10-18 NOTE — ED Notes (Signed)
Patient transported to X-ray 

## 2015-10-18 NOTE — ED Notes (Signed)
Unable to continue screening. HX of Dementia. Pt unable to answer questions

## 2015-10-18 NOTE — ED Provider Notes (Signed)
[facility] DEPT Provider Note   CSN: [id number] Arrival date & time: 10/18/15  1436     History   Chief Complaint Chief Complaint  Patient presents with  . Fall  . Arm Pain    RT ARM PAIN  . Shoulder Pain    HPI [name] Ramos is a 82 y.o. female.  The history is provided by the EMS personnel. History limited by: dementia.  Fall  This is a new problem. Episode onset: unknown. The problem occurs constantly. The problem has not changed since onset.Associated symptoms comments: None. Nothing aggravates the symptoms. Nothing relieves the symptoms. She has tried nothing for the symptoms.    Past Medical History:  Diagnosis Date  . Alzheimer disease   . Anemia   . Anxiety disorder   . Arthritis   . Cataracts, bilateral   . Chronic constipation   . Chronic kidney disease, stage IV (severe) 12/14/2012  . Chronic pain   . Diabetes mellitus without complication ([place])   . Fecal impaction ([place]) 12/15/2012  . Hearing impaired   . Heart block   . Hemorrhoids   . Hypertension   . Peripheral vascular disease ([place])   . Protein-calorie malnutrition, severe ([place]) 12/16/2012  . Recurrent falls   . Underweight 12/17/2012  . Vitamin B 12 deficiency   . Vitamin D deficiency     Patient Active Problem List   Diagnosis Date Noted  . SVT (supraventricular tachycardia) ([place]) 08/12/2013  . Anxiety state, unspecified 02/01/2013  . Essential thrombocythemia ([place]) 02/01/2013  . Unspecified constipation 01/21/2013  . Secondary renovascular hypertension, benign 01/18/2013  . Anemia in chronic kidney disease(285.21) 01/18/2013  . Underweight 12/17/2012  . Protein-calorie malnutrition, severe ([place]) 12/16/2012  . Fecal impaction ([place]) 12/15/2012  . Chronic kidney disease, stage IV (severe) ([place]) 12/14/2012  . Alzheimer disease   . Hypertension   . Arthritis   . Hemorrhoids     Past Surgical History:  Procedure Laterality Date  . APPENDECTOMY    . CATARACT EXTRACTION,  BILATERAL    . ORIF RADIAL FRACTURE Left 08/30/2015   Procedure: OPEN REDUCTION INTERNAL FIXATION (ORIF) LEFT RADIUS AND ULNA FRACTURES;  Surgeon: Leandrew Koyanagi, MD;  Location: [facility];  Service: Orthopedics;  Laterality: Left;  . TONSILLECTOMY      OB History    No data available       Home Medications    Prior to Admission medications   Medication Sig Start Date End Date Taking? Authorizing Provider  acetaminophen (TYLENOL) 325 MG tablet Take 650 mg by mouth every 6 (six) hours as needed for moderate pain.     Historical Provider, MD  amLODipine-valsartan (EXFORGE) 10-160 MG per tablet Take 1 tablet by mouth daily.     Historical Provider, MD  cholecalciferol (VITAMIN D) 1000 UNITS tablet Take 1,000 Units by mouth daily.     Historical Provider, MD  citalopram (CELEXA) 20 MG tablet Take 20 mg by mouth daily.    Historical Provider, MD  diclofenac sodium (VOLTAREN) 1 % GEL Apply 4 g topically 2 (two) times daily as needed (leg pain).     Historical Provider, MD  divalproex (DEPAKOTE SPRINKLE) 125 MG capsule Take 125-250 mg by mouth 2 (two) times daily. Order is written as take 250mg  by mouth twice daily (8am and 8pm) and then another order is written for 125mg  by mouth at night (8pm) for a total of 375 mg at night    Historical Provider, MD  docusate sodium (COLACE) 100 MG  capsule Take 100 mg by mouth daily.    Historical Provider, MD  ferrous sulfate 325 (65 FE) MG tablet Take 325 mg by mouth daily.     Historical Provider, MD  HYDROcodone-acetaminophen (NORCO) 5-325 MG tablet Take 1-2 tablets by mouth every 6 (six) hours as needed. Patient taking differently: Take 1-2 tablets by mouth every 12 (twelve) hours as needed for moderate pain.  08/30/15   Naiping Ephriam Jenkins, MD  lidocaine (LIDODERM) 5 % Place 1 patch onto the skin every 12 (twelve) hours. Remove & Discard patch within 12 hours or as directed by MD (apply every morning and remove every night    Historical Provider, MD  LORazepam (ATIVAN)  0.5 MG tablet Take 0.5 mg by mouth 2 (two) times daily. May take an additional 0.25mg s every 6 hours as needed for agitation 08/11/13   Historical Provider, MD  LORAZEPAM PO Take 1 mg by mouth every 8 (eight) hours as needed (agitation).    Historical Provider, MD  menthol-cetylpyridinium (CEPACOL) 3 MG lozenge Take 1 lozenge by mouth daily as needed for sore throat.    Historical Provider, MD  metoprolol succinate (TOPROL-XL) 25 MG 24 hr tablet Take 12.5 mg by mouth daily.  08/09/13   Historical Provider, MD  ondansetron (ZOFRAN-ODT) 4 MG disintegrating tablet Take 1 tablet (4 mg total) by mouth every 8 (eight) hours as needed for nausea. 06/23/14   Varney Biles, MD  polyethylene glycol (MIRALAX / GLYCOLAX) packet Take 17 g by mouth daily as needed for mild constipation. Mix in 4 oz of water and drink    Historical Provider, MD  polyvinyl alcohol (LIQUIFILM TEARS) 1.4 % ophthalmic solution Place 2 drops into both eyes every 2 (two) hours as needed for dry eyes.    Historical Provider, MD  simethicone (MYLICON) 0000000 MG chewable tablet Chew 125 mg by mouth daily as needed for flatulence.     Historical Provider, MD  triamcinolone cream (KENALOG) 0.1 % Apply 1 application topically 3 (three) times daily as needed (dry skin).    Historical Provider, MD  vitamin C (ASCORBIC ACID) 500 MG tablet Take 500 mg by mouth daily.     Historical Provider, MD    Family History Family History  Problem Relation Age of Onset  . Breast cancer      multiple  . Dementia Sister   . High blood pressure      multiple  . Diabetes      multiple  . Colon cancer Neg Hx     Social History Social History  Substance Use Topics  . Smoking status: Never Smoker  . Smokeless tobacco: Never Used  . Alcohol use No     Allergies   Ace inhibitors; Codeine; Penicillins; Sulfa antibiotics; and Tramadol   Review of Systems Review of Systems  Unable to perform ROS: Dementia   Level 5 exception to history: dementia    Physical Exam Updated Vital Signs BP 120/72 (BP Location: Left Arm)   Pulse 61   Temp 97.5 F (36.4 C) (Oral)   Resp 18   SpO2 100%   Physical Exam  Constitutional: She appears well-developed and well-nourished. No distress.  HENT:  Head: Normocephalic and atraumatic.  Eyes: Conjunctivae are normal.  Neck: Neck supple. No tracheal deviation present.  Cardiovascular: Normal rate, regular rhythm and normal heart sounds.   Pulmonary/Chest: Effort normal and breath sounds normal. No respiratory distress.  Abdominal: Soft. She exhibits no distension.  Musculoskeletal:  No tenderness of all 4  extremities with full range of motion maintained.  Neurological: She is alert. She is disoriented (at baseline). No cranial nerve deficit.  Skin: Skin is warm and dry.  Psychiatric: She has a normal mood and affect.     ED Treatments / Results  Labs (all labs ordered are listed, but only abnormal results are displayed) Labs Reviewed - No data to display  EKG  EKG Interpretation None       Radiology Dg Humerus Right  Result Date: 10/18/2015 CLINICAL DATA:  Pain following fall EXAM: RIGHT HUMERUS - 2+ VIEW COMPARISON:  Right shoulder September 05, 2015 FINDINGS: Frontal and lateral views were obtained. There is no fracture or dislocation. There is moderate generalized osteoarthritic change in the shoulder joint. No abnormal periosteal reaction. IMPRESSION: Moderate generalized osteoarthritic change in the right shoulder region. No evident fracture or dislocation. Electronically Signed   By: Lowella Grip III M.D.   On: 10/18/2015 16:02    Procedures Procedures (including critical care time)  Medications Ordered in ED Medications - No data to display   Initial Impression / Assessment and Plan / ED Course  I have reviewed the triage vital signs and the nursing notes.  Pertinent labs & imaging results that were available during my care of the patient were reviewed by me and  considered in my medical decision making (see chart for details).  Clinical Course    82 year old female with history of dementia presents after an unwitnessed fall where she was found laying on the ground. By report she was clutching her right arm after the fall but has no external evidence of injury on examination here. Plain film was ordered to evaluate for occult injury. The patient was unable to provide any further history. Plan to follow up with PCP as needed and return precautions discussed for worsening or new concerning symptoms.   Final Clinical Impressions(s) / ED Diagnoses   Final diagnoses:  Fall, initial encounter    New Prescriptions New Prescriptions   No medications on file     Leo Grosser, MD 10/18/15 [phone]

## 2015-10-18 NOTE — ED Notes (Signed)
MD at bedside. 

## 2015-10-18 NOTE — ED Notes (Signed)
Apple juice given. Pt tolerated with assisst

## 2015-11-09 ENCOUNTER — Emergency Department (HOSPITAL_COMMUNITY)
Admission: EM | Admit: 2015-11-09 | Discharge: 2015-11-10 | Disposition: A | Discharge status: Home / Self Care | Payer: Medicare Other | Attending: Emergency Medicine | Admitting: Emergency Medicine

## 2015-11-09 ENCOUNTER — Encounter (HOSPITAL_COMMUNITY): Payer: Self-pay

## 2015-11-09 DIAGNOSIS — Y92129 Unspecified place in nursing home as the place of occurrence of the external cause: Secondary | ICD-10-CM | POA: Insufficient documentation

## 2015-11-09 DIAGNOSIS — Y999 Unspecified external cause status: Secondary | ICD-10-CM | POA: Diagnosis not present

## 2015-11-09 DIAGNOSIS — W19XXXA Unspecified fall, initial encounter: Secondary | ICD-10-CM

## 2015-11-09 DIAGNOSIS — N390 Urinary tract infection, site not specified: Secondary | ICD-10-CM | POA: Diagnosis not present

## 2015-11-09 DIAGNOSIS — W1839XA Other fall on same level, initial encounter: Secondary | ICD-10-CM | POA: Diagnosis not present

## 2015-11-09 DIAGNOSIS — Y939 Activity, unspecified: Secondary | ICD-10-CM | POA: Insufficient documentation

## 2015-11-09 DIAGNOSIS — I129 Hypertensive chronic kidney disease with stage 1 through stage 4 chronic kidney disease, or unspecified chronic kidney disease: Secondary | ICD-10-CM | POA: Diagnosis not present

## 2015-11-09 DIAGNOSIS — Z043 Encounter for examination and observation following other accident: Secondary | ICD-10-CM | POA: Diagnosis present

## 2015-11-09 DIAGNOSIS — Z79899 Other long term (current) drug therapy: Secondary | ICD-10-CM | POA: Insufficient documentation

## 2015-11-09 DIAGNOSIS — N184 Chronic kidney disease, stage 4 (severe): Secondary | ICD-10-CM | POA: Insufficient documentation

## 2015-11-09 DIAGNOSIS — E1122 Type 2 diabetes mellitus with diabetic chronic kidney disease: Secondary | ICD-10-CM | POA: Insufficient documentation

## 2015-11-09 LAB — URINALYSIS, ROUTINE W REFLEX MICROSCOPIC
Bilirubin Urine: NEGATIVE
Glucose, UA: NEGATIVE mg/dL
Ketones, ur: NEGATIVE mg/dL
Nitrite: NEGATIVE
Protein, ur: NEGATIVE mg/dL
Specific Gravity, Urine: 1.02 (ref 1.005–1.030)
pH: 5.5 (ref 5.0–8.0)

## 2015-11-09 LAB — CBC
HEMATOCRIT: 30.7 % — AB (ref 36.0–46.0)
HEMOGLOBIN: 9.8 g/dL — AB (ref 12.0–15.0)
MCH: 23.4 pg — ABNORMAL LOW (ref 26.0–34.0)
MCHC: 31.9 g/dL (ref 30.0–36.0)
MCV: 73.3 fL — ABNORMAL LOW (ref 78.0–100.0)
Platelets: 304 10*3/uL (ref 150–400)
RBC: 4.19 MIL/uL (ref 3.87–5.11)
RDW: 15.8 % — AB (ref 11.5–15.5)
WBC: 3.9 10*3/uL — AB (ref 4.0–10.5)

## 2015-11-09 LAB — BASIC METABOLIC PANEL WITH GFR
Anion gap: 7 (ref 5–15)
BUN: 35 mg/dL — ABNORMAL HIGH (ref 6–20)
CO2: 28 mmol/L (ref 22–32)
Calcium: 8.5 mg/dL — ABNORMAL LOW (ref 8.9–10.3)
Chloride: 98 mmol/L — ABNORMAL LOW (ref 101–111)
Creatinine, Ser: 0.87 mg/dL (ref 0.44–1.00)
GFR calc Af Amer: >60 mL/min
GFR calc non Af Amer: 54 mL/min — ABNORMAL LOW
Glucose, Bld: 121 mg/dL — ABNORMAL HIGH (ref 65–99)
Potassium: 4.3 mmol/L (ref 3.5–5.1)
Sodium: 133 mmol/L — ABNORMAL LOW (ref 135–145)

## 2015-11-09 LAB — URINE MICROSCOPIC-ADD ON

## 2015-11-09 MED ORDER — CIPROFLOXACIN HCL 500 MG PO TABS: 500.0000 mg | ORAL_TABLET | Freq: Two times a day (BID) | ORAL | 0 refills | Status: DC

## 2015-11-09 MED ORDER — CIPROFLOXACIN HCL 500 MG PO TABS
500.0000 mg | ORAL_TABLET | Freq: Once | ORAL | Status: AC
  Administered 2015-11-09: 500 mg via ORAL
  Filled 2015-11-09: qty 1

## 2015-11-09 NOTE — ED Triage Notes (Signed)
Pt arrived via [facility] from facility who found her on commode in hallway and then slid her to the floor.  Denies any injuries. Hx: Dementia facility states she is at baseline

## 2015-11-09 NOTE — ED Notes (Signed)
Updated Arlyce Harman at [facility] regarding care and prescription.

## 2015-11-09 NOTE — ED Provider Notes (Signed)
[name] Ramos   CSN: [id number] Arrival date & time: 11/09/15  2115     History   Chief Complaint Chief Complaint  Patient presents with  . Fall    HPI [name] Ramos is a 82 y.o. female.  HPI  A LEVEL 5 CAVEAT PERTAINS DUE TO DEMENTIA  Pt presenting after fall at nursing facility.  Per EMS she was found fallen over a toilet, she was helped down to the floor.  No head injury.  Per EMS nursing staff said she was at her baseline. She has not current complaints.    Past Medical History:  Diagnosis Date  . Alzheimer disease   . Anemia   . Anxiety disorder   . Arthritis   . Cataracts, bilateral   . Chronic constipation   . Chronic kidney disease, stage IV (severe) 12/14/2012  . Chronic pain   . Diabetes mellitus without complication ([place])   . Fecal impaction ([place]) 12/15/2012  . Hearing impaired   . Heart block   . Hemorrhoids   . Hypertension   . Peripheral vascular disease ([place])   . Protein-calorie malnutrition, severe ([place]) 12/16/2012  . Recurrent falls   . Underweight 12/17/2012  . Vitamin B 12 deficiency   . Vitamin D deficiency     Patient Active Problem List   Diagnosis Date Noted  . SVT (supraventricular tachycardia) ([place]) 08/12/2013  . Anxiety state, unspecified 02/01/2013  . Essential thrombocythemia ([place]) 02/01/2013  . Unspecified constipation 01/21/2013  . Secondary renovascular hypertension, benign 01/18/2013  . Anemia in chronic kidney disease(285.21) 01/18/2013  . Underweight 12/17/2012  . Protein-calorie malnutrition, severe ([place]) 12/16/2012  . Fecal impaction ([place]) 12/15/2012  . Chronic kidney disease, stage IV (severe) ([place]) 12/14/2012  . Alzheimer disease   . Hypertension   . Arthritis   . Hemorrhoids     Past Surgical History:  Procedure Laterality Date  . APPENDECTOMY    . CATARACT EXTRACTION, BILATERAL    . ORIF RADIAL FRACTURE Left 08/30/2015   Procedure: OPEN REDUCTION INTERNAL FIXATION (ORIF) LEFT RADIUS AND  ULNA FRACTURES;  Surgeon: Leandrew Koyanagi, MD;  Location: [facility];  Service: Orthopedics;  Laterality: Left;  . TONSILLECTOMY      OB History    No data available       Home Medications    Prior to Admission medications   Medication Sig Start Date End Date Taking? Authorizing Provider  acetaminophen (TYLENOL) 325 MG tablet Take 650 mg by mouth every 6 (six) hours as needed for moderate pain.    Yes Historical Provider, MD  alum & mag hydroxide-simeth (MAALOX/MYLANTA) 200-200-20 MG/5ML suspension Take 30 mLs by mouth every 6 (six) hours as needed for indigestion or heartburn.   Yes Historical Provider, MD  amLODipine-valsartan (EXFORGE) 10-160 MG per tablet Take 1 tablet by mouth daily.    Yes Historical Provider, MD  cholecalciferol (VITAMIN D) 1000 UNITS tablet Take 1,000 Units by mouth daily.    Yes Historical Provider, MD  citalopram (CELEXA) 20 MG tablet Take 20 mg by mouth daily.   Yes Historical Provider, MD  diclofenac sodium (VOLTAREN) 1 % GEL Apply 4 g topically 2 (two) times daily as needed (leg pain).    Yes Historical Provider, MD  divalproex (DEPAKOTE SPRINKLE) 125 MG capsule Take 125-250 mg by mouth 2 (two) times daily. Order is written as take 250mg  by mouth twice daily (8am and 8pm) and then another order is written for 125mg  by mouth at night (8pm) for  a total of 375 mg at night   Yes Historical Provider, MD  docusate sodium (COLACE) 100 MG capsule Take 100 mg by mouth daily.   Yes Historical Provider, MD  ferrous sulfate 325 (65 FE) MG tablet Take 325 mg by mouth daily.    Yes Historical Provider, MD  HYDROcodone-acetaminophen (NORCO) 5-325 MG tablet Take 1-2 tablets by mouth every 6 (six) hours as needed. Patient taking differently: Take 1-2 tablets by mouth 3 (three) times daily.  08/30/15  Yes Naiping Ephriam Jenkins, MD  lidocaine (LIDODERM) 5 % Place 1 patch onto the skin daily. Remove & Discard patch within 12 hours or as directed by MD (apply every morning and remove every night     Yes Historical Provider, MD  loperamide (IMODIUM) 2 MG capsule Take 2 mg by mouth as needed for diarrhea or loose stools.   Yes Historical Provider, MD  LORazepam (ATIVAN) 0.5 MG tablet Take 0.5 mg by mouth See admin instructions. Takes 0.5mg  twice daily, Can take 0.25mg  as needed for agitation 08/11/13  Yes Historical Provider, MD  menthol-cetylpyridinium (CEPACOL) 3 MG lozenge Take 1 lozenge by mouth daily as needed for sore throat.   Yes Historical Provider, MD  metoprolol succinate (TOPROL-XL) 25 MG 24 hr tablet Take 12.5 mg by mouth daily.  08/09/13  Yes Historical Provider, MD  ondansetron (ZOFRAN) 4 MG tablet Take 4 mg by mouth every 8 (eight) hours as needed for nausea or vomiting.   Yes Historical Provider, MD  polyethylene glycol (MIRALAX / GLYCOLAX) packet Take 17 g by mouth daily as needed for mild constipation. Mix in 4 oz of water and drink   Yes Historical Provider, MD  polyvinyl alcohol (LIQUIFILM TEARS) 1.4 % ophthalmic solution Place 2 drops into both eyes every 2 (two) hours as needed for dry eyes.   Yes Historical Provider, MD  simethicone (MYLICON) 0000000 MG chewable tablet Chew 125 mg by mouth daily as needed for flatulence.    Yes Historical Provider, MD  triamcinolone cream (KENALOG) 0.1 % Apply 1 application topically 3 (three) times daily as needed (dry skin).   Yes Historical Provider, MD  vitamin C (ASCORBIC ACID) 500 MG tablet Take 500 mg by mouth daily.    Yes Historical Provider, MD  ciprofloxacin (CIPRO) 500 MG tablet Take 1 tablet (500 mg total) by mouth every 12 (twelve) hours. 11/09/15   Alfonzo Beers, MD  ondansetron (ZOFRAN-ODT) 4 MG disintegrating tablet Take 1 tablet (4 mg total) by mouth every 8 (eight) hours as needed for nausea. Patient not taking: Reported on 11/09/2015 06/23/14   Varney Biles, MD    Family History Family History  Problem Relation Age of Onset  . Breast cancer      multiple  . Dementia Sister   . High blood pressure      multiple  .  Diabetes      multiple  . Colon cancer Neg Hx     Social History Social History  Substance Use Topics  . Smoking status: Never Smoker  . Smokeless tobacco: Never Used  . Alcohol use No     Allergies   Ace inhibitors; Codeine; Penicillins; Sulfa antibiotics; and Tramadol   Review of Systems Review of Systems   UNABLE TO OBTAIN ROS DUE TO LEVEL 5 CAVEAT   Physical Exam Updated Vital Signs BP (!) 104/47   Pulse (!) 48   Temp 97.8 F (36.6 C) (Oral)   Resp 15   SpO2 99%  Vitals reviewed Physical Exam  Physical Examination: General appearance - frail elderly female, lying in bed, and in no distress Mental status - sleeping but arousable, not oriented Chest - clear to auscultation, no wheezes, rales or rhonchi, symmetric air entry Heart - normal rate, regular rhythm, normal S1, S2, no murmurs, rubs, clicks or gallops Abdomen - soft, nontender, nondistended, no masses or organomegaly Neurological - sleeping but arousable, not oriented, moves all extremities  Extremities - peripheral pulses normal, no pedal edema, no clubbing or cyanosis Skin - normal coloration and turgor, no rashes  ED Treatments / Results  Labs (all labs ordered are listed, but only abnormal results are displayed) Labs Reviewed  CBC - Abnormal; Notable for the following:       Result Value   WBC 3.9 (*)    Hemoglobin 9.8 (*)    HCT 30.7 (*)    MCV 73.3 (*)    MCH 23.4 (*)    RDW 15.8 (*)    All other components within normal limits  BASIC METABOLIC PANEL - Abnormal; Notable for the following:    Sodium 133 (*)    Chloride 98 (*)    Glucose, Bld 121 (*)    BUN 35 (*)    Calcium 8.5 (*)    GFR calc non Af Amer 54 (*)    All other components within normal limits  URINALYSIS, ROUTINE W REFLEX MICROSCOPIC (NOT AT [facility]) - Abnormal; Notable for the following:    APPearance HAZY (*)    Hgb urine dipstick TRACE (*)    Leukocytes, UA LARGE (*)    All other components within normal limits  URINE  MICROSCOPIC-ADD ON - Abnormal; Notable for the following:    Squamous Epithelial / LPF 0-5 (*)    Bacteria, UA FEW (*)    All other components within normal limits  URINE CULTURE    EKG  EKG Interpretation  Date/Time:  Thursday November 09 2015 22:10:11 EDT Ventricular Rate:  55 PR Interval:    QRS Duration: 94 QT Interval:  448 QTC Calculation: 429 R Axis:   82 Text Interpretation:  Sinus bradycardia Borderline right axis deviation Nonspecific T abnormalities, lateral leads Minimal ST elevation, anterior leads Since previous tracing rate slower Confirmed by Canary Brim  MD, Nachelle Negrette [phone]) on 11/09/2015 10:18:44 PM       Radiology No results found.  Procedures Procedures (including critical care time)  Medications Ordered in ED Medications  ciprofloxacin (CIPRO) tablet 500 mg (500 mg Oral Given 11/09/15 2340)     Initial Impression / Assessment and Plan / ED Course  I have reviewed the triage vital signs and the nursing notes.  Pertinent labs & imaging results that were available during my care of the patient were reviewed by me and considered in my medical decision making (see chart for details).  Clinical Course    Pt presenting after fall, she has eveidence of UTI on urinalysis- urine culture pending. She has no traumatic findings on exam.  Started on cipro  In the ED due to pencillin allergy.  Discharged with strict return precautions.  Pt agreeable with plan.  Final Clinical Impressions(s) / ED Diagnoses   Final diagnoses:  Fall, initial encounter  UTI (lower urinary tract infection)    New Prescriptions New Prescriptions   CIPROFLOXACIN (CIPRO) 500 MG TABLET    Take 1 tablet (500 mg total) by mouth every 12 (twelve) hours.     Alfonzo Beers, MD 11/09/15 [phone]

## 2015-11-09 NOTE — ED Notes (Signed)
Pt stable, facility and [facility] contacted.

## 2015-11-09 NOTE — Discharge Instructions (Signed)
Return to the ED with any concerns including vomiting and not able to keep down liquids or medications, fainting, weakness of arms or legs, decreased level of alertness/lethargy, or any other alarming symptoms

## 2015-11-09 NOTE — ED Notes (Signed)
Contacted [facility] for transport to [facility]

## 2015-11-11 LAB — URINE CULTURE: CULTURE: NO GROWTH

## 2016-03-16 ENCOUNTER — Encounter (HOSPITAL_COMMUNITY): Payer: Self-pay | Admitting: *Deleted

## 2016-03-16 ENCOUNTER — Emergency Department (HOSPITAL_COMMUNITY): Payer: Medicare Other

## 2016-03-16 ENCOUNTER — Emergency Department (HOSPITAL_COMMUNITY)
Admission: EM | Admit: 2016-03-16 | Discharge: 2016-03-17 | Disposition: A | Discharge status: Home / Self Care | Payer: Medicare Other | Attending: Emergency Medicine | Admitting: Emergency Medicine

## 2016-03-16 DIAGNOSIS — Z79899 Other long term (current) drug therapy: Secondary | ICD-10-CM | POA: Diagnosis not present

## 2016-03-16 DIAGNOSIS — G309 Alzheimer's disease, unspecified: Secondary | ICD-10-CM | POA: Insufficient documentation

## 2016-03-16 DIAGNOSIS — F0280 Dementia in other diseases classified elsewhere without behavioral disturbance: Secondary | ICD-10-CM | POA: Insufficient documentation

## 2016-03-16 DIAGNOSIS — N184 Chronic kidney disease, stage 4 (severe): Secondary | ICD-10-CM | POA: Diagnosis not present

## 2016-03-16 DIAGNOSIS — S7002XA Contusion of left hip, initial encounter: Secondary | ICD-10-CM | POA: Insufficient documentation

## 2016-03-16 DIAGNOSIS — Y929 Unspecified place or not applicable: Secondary | ICD-10-CM | POA: Insufficient documentation

## 2016-03-16 DIAGNOSIS — W19XXXA Unspecified fall, initial encounter: Secondary | ICD-10-CM | POA: Insufficient documentation

## 2016-03-16 DIAGNOSIS — I129 Hypertensive chronic kidney disease with stage 1 through stage 4 chronic kidney disease, or unspecified chronic kidney disease: Secondary | ICD-10-CM | POA: Diagnosis not present

## 2016-03-16 DIAGNOSIS — Y939 Activity, unspecified: Secondary | ICD-10-CM | POA: Diagnosis not present

## 2016-03-16 DIAGNOSIS — Y999 Unspecified external cause status: Secondary | ICD-10-CM | POA: Diagnosis not present

## 2016-03-16 DIAGNOSIS — E1122 Type 2 diabetes mellitus with diabetic chronic kidney disease: Secondary | ICD-10-CM | POA: Diagnosis not present

## 2016-03-16 DIAGNOSIS — S79912A Unspecified injury of left hip, initial encounter: Secondary | ICD-10-CM | POA: Diagnosis present

## 2016-03-16 MED ORDER — ACETAMINOPHEN 325 MG PO TABS
650.0000 mg | ORAL_TABLET | Freq: Once | ORAL | Status: DC
  Filled 2016-03-16: qty 2

## 2016-03-16 NOTE — ED Notes (Signed)
Bed: [id number] Expected date:  Expected time:  Means of arrival:  Comments: 83 yo fall

## 2016-03-16 NOTE — ED Notes (Signed)
Patient transported to X-ray 

## 2016-03-16 NOTE — ED Notes (Signed)
Dr. Morton Amy at bedside to evaluate pt.

## 2016-03-16 NOTE — ED Notes (Signed)
Pt now responding to staff and refusing medications prior to going to radiology. MD notified.

## 2016-03-16 NOTE — ED Triage Notes (Signed)
Per EMS, pt from [facility] was found by staff laying down in front of toilet. Staff are not sure when pt fell. Pt has no apparent injury or complaints. Pt is at baseline mental status. CBG 145

## 2016-03-16 NOTE — ED Notes (Signed)
Pt sent from [facility] for evaluation of unwitnessed fall. Per EMS pt is at baseline mentality with inconprehensable speech and moaning. Pt unable to define location of pain. No obvious injury noted.

## 2016-03-16 NOTE — ED Provider Notes (Signed)
[facility] DEPT Provider Note   CSN: [id number] Arrival date & time: 03/16/16  2139  By signing my name below, I, Dora Sims, attest that this documentation has been prepared under the direction and in the presence of physician practitioner, Sherwood Gambler, MD. Electronically Signed: Dora Sims, Scribe. 03/16/2016. 11:23 PM.  History   Chief Complaint Chief Complaint  Patient presents with  . Fall    unwitnessed    The history is provided by medical records and the nursing home. No language interpreter was used.     HPI Comments: LEVEL 5 CAVEAT DUE TO ALZHEIMER'S [name] Ramos is a 83 y.o. female with PMHx significant for Alzheimer's who presents to the Emergency Department via EMS for evaluation after an unwitnessed fall occurring PTA. Nursing staff reports that patient was found on the floor in front of a toilet without any obvious signs of sustained trauma. Nursing staff indicates that patient has been at her baseline mental status. No complaints noted at this time.  Past Medical History:  Diagnosis Date  . Alzheimer disease   . Anemia   . Anxiety disorder   . Arthritis   . Cataracts, bilateral   . Chronic constipation   . Chronic kidney disease, stage IV (severe) 12/14/2012  . Chronic pain   . Diabetes mellitus without complication ([place])   . Fecal impaction ([place]) 12/15/2012  . Hearing impaired   . Heart block   . Hemorrhoids   . Hypertension   . Peripheral vascular disease ([place])   . Protein-calorie malnutrition, severe ([place]) 12/16/2012  . Recurrent falls   . Underweight 12/17/2012  . Vitamin B 12 deficiency   . Vitamin D deficiency     Patient Active Problem List   Diagnosis Date Noted  . SVT (supraventricular tachycardia) ([place]) 08/12/2013  . Anxiety state, unspecified 02/01/2013  . Essential thrombocythemia ([place]) 02/01/2013  . Unspecified constipation 01/21/2013  . Secondary renovascular hypertension, benign 01/18/2013  . Anemia in chronic  kidney disease(285.21) 01/18/2013  . Underweight 12/17/2012  . Protein-calorie malnutrition, severe ([place]) 12/16/2012  . Fecal impaction ([place]) 12/15/2012  . Chronic kidney disease, stage IV (severe) ([place]) 12/14/2012  . Alzheimer disease   . Hypertension   . Arthritis   . Hemorrhoids     Past Surgical History:  Procedure Laterality Date  . APPENDECTOMY    . CATARACT EXTRACTION, BILATERAL    . ORIF RADIAL FRACTURE Left 08/30/2015   Procedure: OPEN REDUCTION INTERNAL FIXATION (ORIF) LEFT RADIUS AND ULNA FRACTURES;  Surgeon: Leandrew Koyanagi, MD;  Location: [facility];  Service: Orthopedics;  Laterality: Left;  . TONSILLECTOMY      OB History    No data available       Home Medications    Prior to Admission medications   Medication Sig Start Date End Date Taking? Authorizing Provider  acetaminophen (TYLENOL) 500 MG tablet Take 500 mg by mouth every 4 (four) hours as needed for moderate pain.   Yes Historical Provider, MD  alum & mag hydroxide-simeth (MAALOX/MYLANTA) 200-200-20 MG/5ML suspension Take 30 mLs by mouth every 6 (six) hours as needed for indigestion or heartburn.   Yes Historical Provider, MD  amLODipine-valsartan (EXFORGE) 10-160 MG per tablet Take 1 tablet by mouth daily.    Yes Historical Provider, MD  cholecalciferol (VITAMIN D) 1000 UNITS tablet Take 1,000 Units by mouth daily.    Yes Historical Provider, MD  citalopram (CELEXA) 10 MG tablet Take 15 mg by mouth daily.   Yes Historical Provider, MD  diclofenac  sodium (VOLTAREN) 1 % GEL Apply 4 g topically 2 (two) times daily as needed (leg pain).    Yes Historical Provider, MD  divalproex (DEPAKOTE SPRINKLE) 125 MG capsule Take 125-250 mg by mouth 2 (two) times daily. Order is written as take 250mg  by mouth twice daily (8am and 8pm) and then another order is written for 125mg  by mouth at night (8pm) for a total of 375 mg at night   Yes Historical Provider, MD  docusate sodium (COLACE) 100 MG capsule Take 100 mg by mouth daily.    Yes Historical Provider, MD  ferrous sulfate 325 (65 FE) MG tablet Take 325 mg by mouth daily.    Yes Historical Provider, MD  lidocaine (LIDODERM) 5 % Place 1 patch onto the skin daily. Remove & Discard patch within 12 hours or as directed by MD (apply every morning and remove every night    Yes Historical Provider, MD  loperamide (IMODIUM) 2 MG capsule Take 2 mg by mouth as needed for diarrhea or loose stools.   Yes Historical Provider, MD  LORazepam (ATIVAN) 0.5 MG tablet Take 0.5 mg by mouth 2 (two) times daily. Takes 0.5mg  twice daily, Can take 0.25mg  as needed for agitation 08/11/13  Yes Historical Provider, MD  menthol-cetylpyridinium (CEPACOL) 3 MG lozenge Take 1 lozenge by mouth daily as needed for sore throat.   Yes Historical Provider, MD  metoprolol succinate (TOPROL-XL) 25 MG 24 hr tablet Take 12.5 mg by mouth daily.  08/09/13  Yes Historical Provider, MD  ondansetron (ZOFRAN) 4 MG tablet Take 4 mg by mouth every 8 (eight) hours as needed for nausea or vomiting.   Yes Historical Provider, MD  polyethylene glycol (MIRALAX / GLYCOLAX) packet Take 17 g by mouth daily as needed for mild constipation. Mix in 4 oz of water and drink   Yes Historical Provider, MD  polyvinyl alcohol (LIQUIFILM TEARS) 1.4 % ophthalmic solution Place 2 drops into both eyes every 2 (two) hours as needed for dry eyes.   Yes Historical Provider, MD  simethicone (MYLICON) 0000000 MG chewable tablet Chew 125 mg by mouth daily as needed for flatulence.    Yes Historical Provider, MD  Skin Protectants, Misc. (EUCERIN) cream Apply 1 application topically 2 (two) times daily.   Yes Historical Provider, MD  triamcinolone cream (KENALOG) 0.1 % Apply 1 application topically 3 (three) times daily as needed (dry skin).   Yes Historical Provider, MD  vitamin C (ASCORBIC ACID) 500 MG tablet Take 500 mg by mouth daily.    Yes Historical Provider, MD  acetaminophen (TYLENOL) 325 MG tablet Take 650 mg by mouth every 6 (six) hours as needed  for moderate pain.     Historical Provider, MD  ciprofloxacin (CIPRO) 500 MG tablet Take 1 tablet (500 mg total) by mouth every 12 (twelve) hours. Patient not taking: Reported on 03/16/2016 11/09/15   Alfonzo Beers, MD  HYDROcodone-acetaminophen [facility]) 5-325 MG tablet Take 1-2 tablets by mouth every 6 (six) hours as needed. Patient not taking: Reported on 03/16/2016 08/30/15   Leandrew Koyanagi, MD  ondansetron (ZOFRAN-ODT) 4 MG disintegrating tablet Take 1 tablet (4 mg total) by mouth every 8 (eight) hours as needed for nausea. Patient not taking: Reported on 11/09/2015 06/23/14   Varney Biles, MD    Family History Family History  Problem Relation Age of Onset  . Breast cancer      multiple  . Dementia Sister   . High blood pressure      multiple  .  Diabetes      multiple  . Colon cancer Neg Hx     Social History Social History  Substance Use Topics  . Smoking status: Never Smoker  . Smokeless tobacco: Never Used  . Alcohol use No     Allergies   Ace inhibitors; Codeine; Penicillins; Sulfa antibiotics; and Tramadol   Review of Systems Review of Systems  Unable to perform ROS: Dementia     Physical Exam Updated Vital Signs BP (!) 115/42 (BP Location: Left Arm)   Pulse 62   Temp 97.8 F (36.6 C) (Axillary)   Resp 19   Ht 5\' 3"  (1.6 m)   Wt 100 lb (45.4 kg)   SpO2 92%   BMI 17.71 kg/m   Physical Exam  Constitutional: She appears well-developed and well-nourished.  HENT:  Head: Normocephalic and atraumatic.  Right Ear: External ear normal.  Left Ear: External ear normal.  Nose: Nose normal.  No obvious signs of head trauma.  Eyes: Right eye exhibits no discharge. Left eye exhibits no discharge.  Cardiovascular: Normal rate, regular rhythm and normal heart sounds.   2+ DP pulses bilaterally.  Pulmonary/Chest: Effort normal and breath sounds normal.  Abdominal: Soft. There is no tenderness.  Musculoskeletal:  Tenderness over left lateral hip and pain with ROM,  difficult to localize if there is pain throughout the rest of her extremity. Mild knee swelling.  Neurological: She is alert.  Alert, does not verbally respond or follow commands.  Skin: Skin is warm and dry.  Nursing note and vitals reviewed.    ED Treatments / Results  Labs (all labs ordered are listed, but only abnormal results are displayed) Labs Reviewed  CBG MONITORING, ED - Abnormal; Notable for the following:       Result Value   Glucose-Capillary 101 (*)    All other components within normal limits    EKG  EKG Interpretation  Date/Time:  Sunday March 17 2016 00:42:08 EST Ventricular Rate:  72 PR Interval:    QRS Duration: 77 QT Interval:  372 QTC Calculation: 408 R Axis:   69 Text Interpretation:  Sinus rhythm Abnormal T, consider ischemia, lateral leads T wave change similar to Sept 2017 Confirmed by Regenia Skeeter MD, Cristen Bredeson [phone]) on 03/17/2016 1:03:31 AM       Radiology Ct Head Wo Contrast  Result Date: 03/17/2016 CLINICAL DATA:  83 year old female with fall. EXAM: CT HEAD WITHOUT CONTRAST CT CERVICAL SPINE WITHOUT CONTRAST TECHNIQUE: Multidetector CT imaging of the head and cervical spine was performed following the standard protocol without intravenous contrast. Multiplanar CT image reconstructions of the cervical spine were also generated. COMPARISON:  CT dated 10/06/2015 FINDINGS: CT HEAD FINDINGS Brain: There is mild age-related atrophy and chronic microvascular ischemic changes. No acute intracranial hemorrhage. No mass effect or midline shift noted. Vascular: No hyperdense vessel or unexpected calcification. Skull: Normal. Negative for fracture or focal lesion. Sinuses/Orbits: There is mucoperiosteal thickening of the right maxillary sinus. No air-fluid level. The remainder of the visualized paranasal sinuses and mastoid air cells are clear. Other: None CT CERVICAL SPINE FINDINGS Alignment: No acute subluxation. There is grade 1 C3-C4 anterolisthesis. Skull base and  vertebrae: No acute fracture. The bones are osteopenic. No suspicious lesions. Soft tissues and spinal canal: No prevertebral fluid or swelling. No visible canal hematoma. Disc levels: There is disc space narrowing at C5-C6. Multilevel anterior bridging osteophyte compatible with diffuse idiopathic skeletal hyperostosis. Upper chest: Negative. Other: None IMPRESSION: No acute intracranial hemorrhage. Mild age-related atrophy and  chronic microvascular ischemic changes. No acute/traumatic cervical spine pathology. Osteopenia with multilevel degenerative changes. Electronically Signed   By: Anner Crete M.D.   On: 03/17/2016 00:54   Ct Cervical Spine Wo Contrast  Result Date: 03/17/2016 CLINICAL DATA:  83 year old female with fall. EXAM: CT HEAD WITHOUT CONTRAST CT CERVICAL SPINE WITHOUT CONTRAST TECHNIQUE: Multidetector CT imaging of the head and cervical spine was performed following the standard protocol without intravenous contrast. Multiplanar CT image reconstructions of the cervical spine were also generated. COMPARISON:  CT dated 10/06/2015 FINDINGS: CT HEAD FINDINGS Brain: There is mild age-related atrophy and chronic microvascular ischemic changes. No acute intracranial hemorrhage. No mass effect or midline shift noted. Vascular: No hyperdense vessel or unexpected calcification. Skull: Normal. Negative for fracture or focal lesion. Sinuses/Orbits: There is mucoperiosteal thickening of the right maxillary sinus. No air-fluid level. The remainder of the visualized paranasal sinuses and mastoid air cells are clear. Other: None CT CERVICAL SPINE FINDINGS Alignment: No acute subluxation. There is grade 1 C3-C4 anterolisthesis. Skull base and vertebrae: No acute fracture. The bones are osteopenic. No suspicious lesions. Soft tissues and spinal canal: No prevertebral fluid or swelling. No visible canal hematoma. Disc levels: There is disc space narrowing at C5-C6. Multilevel anterior bridging osteophyte  compatible with diffuse idiopathic skeletal hyperostosis. Upper chest: Negative. Other: None IMPRESSION: No acute intracranial hemorrhage. Mild age-related atrophy and chronic microvascular ischemic changes. No acute/traumatic cervical spine pathology. Osteopenia with multilevel degenerative changes. Electronically Signed   By: Anner Crete M.D.   On: 03/17/2016 00:54   Ct Hip Left Wo Contrast  Result Date: 03/17/2016 CLINICAL DATA:  Left hip pain after fall EXAM: CT OF THE LEFT HIP WITHOUT CONTRAST TECHNIQUE: Multidetector CT imaging of the left hip was performed according to the standard protocol. Multiplanar CT image reconstructions were also generated. COMPARISON:  03/16/2016 left hip and pelvic radiographs, 08/21/2015 pelvic and left hip radiographs FINDINGS: Bones/Joint/Cartilage No acute displaced fracture of the left hip. Mild degenerative joint space narrowing is noted. No significant joint effusion . Ligaments Suboptimally assessed by CT. Muscles and Tendons No intramuscular hematoma nor avulsion injury. Soft tissues Mild soft tissue fatty induration along the lateral aspect of thigh and left hip likely representing soft tissue contusion. IMPRESSION: Mild soft tissue fatty induration lateral to the left hip consistent with mild soft tissue contusion. No acute fracture of the left hip identified. Electronically Signed   By: Ashley Royalty M.D.   On: 03/17/2016 01:58   Dg Knee Complete 4 Views Left  Result Date: 03/17/2016 CLINICAL DATA:  Unwitnessed fall, pain EXAM: LEFT KNEE - COMPLETE 4+ VIEW COMPARISON:  07/20/2015 FINDINGS: No evidence of fracture, dislocation, or joint effusion. No evidence of arthropathy or other focal bone abnormality. Soft tissues are unremarkable. IMPRESSION: No acute osseous abnormality. Electronically Signed   By: Ashley Royalty M.D.   On: 03/17/2016 00:31   Dg Hip Unilat With Pelvis 2-3 Views Left  Result Date: 03/17/2016 CLINICAL DATA:  Unwitnessed fall.  Left hip  pain. EXAM: DG HIP (WITH OR WITHOUT PELVIS) 2-3V LEFT COMPARISON:  Pelvic radiograph 09/10/2015 FINDINGS: There is no evidence of hip fracture or dislocation. Hip joints are maintained bilaterally. There is lower lumbar facet arthropathy. The bony pelvis appears intact. No significant change from previous exam. IMPRESSION: No acute fracture nor bone destruction. Lower lumbar facet arthropathy. Electronically Signed   By: Ashley Royalty M.D.   On: 03/17/2016 00:29   Dg Femur Min 2 Views Left  Result Date: 03/17/2016 CLINICAL  DATA:  Pain after fall EXAM: LEFT FEMUR 2 VIEWS COMPARISON:  08/06/2015 FINDINGS: There is no evidence of fracture or other focal bone lesions. Soft tissues are unremarkable. Views of the distal femur were included with the same day knee radiographs. IMPRESSION: Negative. Electronically Signed   By: Ashley Royalty M.D.   On: 03/17/2016 00:34    Procedures Procedures (including critical care time)  DIAGNOSTIC STUDIES: Oxygen Saturation is 99% on RA, normal by my interpretation.    Medications Ordered in ED Medications - No data to display   Initial Impression / Assessment and Plan / ED Course  I have reviewed the triage vital signs and the nursing notes.  Pertinent labs & imaging results that were available during my care of the patient were reviewed by me and considered in my medical decision making (see chart for details).  Clinical Course as of Mar 18 1455  Sat Mar 16, 2016  2327 History is quite difficult as she is non-verbal except incomprehensible sounds. However with her seeming to have pain in LLE, will get xrays. Given poor history and unwitnessed fall will get CT head/cspine  [SG]    Clinical Course User Index [SG] Sherwood Gambler, MD    Patient with unwitnessed fall. Many prior Ed visits for same. Is nonambulatory per NH and in a wheelchair. Given hip pain with negative xray a CT was evaluated and negative for fracture. Unable to test mobility due to being  nonambulatory but at this time I think hip fracture is less likely. ECG benign. VSS. D/c back to NH.  Final Clinical Impressions(s) / ED Diagnoses   Final diagnoses:  Fall, initial encounter  Contusion of left hip, initial encounter    New Prescriptions Discharge Medication List as of 03/17/2016  2:54 AM     I personally performed the services described in this documentation, which was scribed in my presence. The recorded information has been reviewed and is accurate.    Sherwood Gambler, MD 03/17/16 [phone]

## 2016-03-17 ENCOUNTER — Emergency Department (HOSPITAL_COMMUNITY): Payer: Medicare Other

## 2016-03-17 DIAGNOSIS — S7002XA Contusion of left hip, initial encounter: Secondary | ICD-10-CM | POA: Diagnosis not present

## 2016-03-17 LAB — CBG MONITORING, ED: GLUCOSE-CAPILLARY: 101 mg/dL — AB (ref 65–99)

## 2016-03-17 NOTE — ED Notes (Signed)
[facility] here for transport back to [facility]

## 2016-03-17 NOTE — ED Notes (Signed)
[facility] notified of need for transport of pt back to residence.  

## 2016-05-14 ENCOUNTER — Encounter (HOSPITAL_COMMUNITY): Payer: Self-pay | Admitting: Emergency Medicine

## 2016-05-14 ENCOUNTER — Emergency Department (HOSPITAL_COMMUNITY)
Admission: EM | Admit: 2016-05-14 | Discharge: 2016-05-14 | Disposition: A | Discharge status: Home / Self Care | Payer: Medicare Other | Attending: Emergency Medicine | Admitting: Emergency Medicine

## 2016-05-14 DIAGNOSIS — F0390 Unspecified dementia without behavioral disturbance: Secondary | ICD-10-CM | POA: Diagnosis present

## 2016-05-14 DIAGNOSIS — Y939 Activity, unspecified: Secondary | ICD-10-CM | POA: Diagnosis not present

## 2016-05-14 DIAGNOSIS — Z79899 Other long term (current) drug therapy: Secondary | ICD-10-CM | POA: Insufficient documentation

## 2016-05-14 DIAGNOSIS — Y92129 Unspecified place in nursing home as the place of occurrence of the external cause: Secondary | ICD-10-CM | POA: Insufficient documentation

## 2016-05-14 DIAGNOSIS — N184 Chronic kidney disease, stage 4 (severe): Secondary | ICD-10-CM | POA: Diagnosis not present

## 2016-05-14 DIAGNOSIS — E1122 Type 2 diabetes mellitus with diabetic chronic kidney disease: Secondary | ICD-10-CM | POA: Diagnosis not present

## 2016-05-14 DIAGNOSIS — I129 Hypertensive chronic kidney disease with stage 1 through stage 4 chronic kidney disease, or unspecified chronic kidney disease: Secondary | ICD-10-CM | POA: Diagnosis not present

## 2016-05-14 DIAGNOSIS — Y999 Unspecified external cause status: Secondary | ICD-10-CM | POA: Insufficient documentation

## 2016-05-14 DIAGNOSIS — W19XXXA Unspecified fall, initial encounter: Secondary | ICD-10-CM

## 2016-05-14 DIAGNOSIS — W050XXA Fall from non-moving wheelchair, initial encounter: Secondary | ICD-10-CM | POA: Insufficient documentation

## 2016-05-14 NOTE — ED Provider Notes (Signed)
[facility] DEPT Provider Note   CSN: [id number] Arrival date & time: 05/14/16  1629     History   Chief Complaint Chief Complaint  Patient presents with  . Fall    HPI [name] Ramos is a 83 y.o. female.  HPI Patient is sent from [facility] with an unwitnessed fall. Patient is suspected to have fallen out of her wheelchair. No report from nursing home of specific concern for injury pattern. Reportedly patient is at her baseline. She makes a recurrent small moaning, humming sound which is reportedly normal. She reportedly stays in a wheelchair and is very stiff. Past Medical History:  Diagnosis Date  . Alzheimer disease   . Anemia   . Anxiety disorder   . Arthritis   . Cataracts, bilateral   . Chronic constipation   . Chronic kidney disease, stage IV (severe) 12/14/2012  . Chronic pain   . Diabetes mellitus without complication ([place])   . Fecal impaction ([place]) 12/15/2012  . Hearing impaired   . Heart block   . Hemorrhoids   . Hypertension   . Peripheral vascular disease ([place])   . Protein-calorie malnutrition, severe ([place]) 12/16/2012  . Recurrent falls   . Underweight 12/17/2012  . Vitamin B 12 deficiency   . Vitamin D deficiency     Patient Active Problem List   Diagnosis Date Noted  . SVT (supraventricular tachycardia) ([place]) 08/12/2013  . Anxiety state, unspecified 02/01/2013  . Essential thrombocythemia ([place]) 02/01/2013  . Unspecified constipation 01/21/2013  . Secondary renovascular hypertension, benign 01/18/2013  . Anemia in chronic kidney disease(285.21) 01/18/2013  . Underweight 12/17/2012  . Protein-calorie malnutrition, severe ([place]) 12/16/2012  . Fecal impaction ([place]) 12/15/2012  . Chronic kidney disease, stage IV (severe) ([place]) 12/14/2012  . Alzheimer disease   . Hypertension   . Arthritis   . Hemorrhoids     Past Surgical History:  Procedure Laterality Date  . APPENDECTOMY    . CATARACT EXTRACTION, BILATERAL    . ORIF RADIAL  FRACTURE Left 08/30/2015   Procedure: OPEN REDUCTION INTERNAL FIXATION (ORIF) LEFT RADIUS AND ULNA FRACTURES;  Surgeon: Leandrew Koyanagi, MD;  Location: [facility];  Service: Orthopedics;  Laterality: Left;  . TONSILLECTOMY      OB History    No data available       Home Medications    Prior to Admission medications   Medication Sig Start Date End Date Taking? Authorizing Provider  acetaminophen (TYLENOL) 500 MG tablet Take 500 mg by mouth every 4 (four) hours as needed for moderate pain.   Yes Historical Provider, MD  alum & mag hydroxide-simeth (MAALOX/MYLANTA) 200-200-20 MG/5ML suspension Take 30 mLs by mouth every 6 (six) hours as needed for indigestion or heartburn.   Yes Historical Provider, MD  amLODipine-valsartan (EXFORGE) 10-160 MG per tablet Take 1 tablet by mouth daily.    Yes Historical Provider, MD  cholecalciferol (VITAMIN D) 1000 UNITS tablet Take 1,000 Units by mouth daily.    Yes Historical Provider, MD  citalopram (CELEXA) 10 MG tablet Take 15 mg by mouth daily.   Yes Historical Provider, MD  diclofenac sodium (VOLTAREN) 1 % GEL Apply 4 g topically 2 (two) times daily as needed (leg pain).    Yes Historical Provider, MD  divalproex (DEPAKOTE SPRINKLE) 125 MG capsule Take 125-250 mg by mouth 2 (two) times daily. Order is written as take 250mg  by mouth twice daily (8am and 8pm) and then another order is written for 125mg  by mouth at night (8pm)  for a total of 375 mg at night   Yes Historical Provider, MD  docusate sodium (COLACE) 100 MG capsule Take 100 mg by mouth daily.   Yes Historical Provider, MD  ferrous sulfate 325 (65 FE) MG tablet Take 325 mg by mouth daily.    Yes Historical Provider, MD  guaifenesin (ROBITUSSIN) 100 MG/5ML syrup Take 200 mg by mouth every 6 (six) hours as needed for cough.   Yes Historical Provider, MD  lidocaine (LIDODERM) 5 % Place 1 patch onto the skin daily. Remove & Discard patch within 12 hours or as directed by MD (apply every morning and remove  every night    Yes Historical Provider, MD  loperamide (IMODIUM) 2 MG capsule Take 2 mg by mouth as needed for diarrhea or loose stools.   Yes Historical Provider, MD  LORazepam (ATIVAN) 0.5 MG tablet Take 0.5 mg by mouth 2 (two) times daily.  08/11/13  Yes Historical Provider, MD  menthol-cetylpyridinium (CEPACOL) 3 MG lozenge Take 1 lozenge by mouth daily as needed for sore throat.   Yes Historical Provider, MD  metoprolol succinate (TOPROL-XL) 25 MG 24 hr tablet Take 12.5 mg by mouth daily.  08/09/13  Yes Historical Provider, MD  neomycin-bacitracin-polymyxin (NEOSPORIN) [phone] ointment Apply 1 application topically as needed (minor skin tears or abrasions).   Yes Historical Provider, MD  ondansetron (ZOFRAN) 4 MG tablet Take 4 mg by mouth every 8 (eight) hours as needed for nausea or vomiting.   Yes Historical Provider, MD  polyethylene glycol (MIRALAX / GLYCOLAX) packet Take 17 g by mouth daily as needed for mild constipation. Mix in 4 oz of water and drink   Yes Historical Provider, MD  polyvinyl alcohol (LIQUIFILM TEARS) 1.4 % ophthalmic solution Place 2 drops into both eyes every 2 (two) hours as needed for dry eyes.   Yes Historical Provider, MD  Skin Protectants, Misc. (EUCERIN) cream Apply 1 application topically 2 (two) times daily.   Yes Historical Provider, MD  triamcinolone cream (KENALOG) 0.1 % Apply 1 application topically 3 (three) times daily as needed (dry skin).   Yes Historical Provider, MD  vitamin C (ASCORBIC ACID) 500 MG tablet Take 500 mg by mouth daily.    Yes Historical Provider, MD  ciprofloxacin (CIPRO) 500 MG tablet Take 1 tablet (500 mg total) by mouth every 12 (twelve) hours. Patient not taking: Reported on 03/16/2016 11/09/15   Alfonzo Beers, MD    Family History Family History  Problem Relation Age of Onset  . Breast cancer      multiple  . Dementia Sister   . High blood pressure      multiple  . Diabetes      multiple  . Colon cancer Neg Hx     Social  History Social History  Substance Use Topics  . Smoking status: Never Smoker  . Smokeless tobacco: Never Used  . Alcohol use No     Allergies   Ace inhibitors; Codeine; Penicillins; Sulfa antibiotics; and Tramadol   Review of Systems Review of Systems Cannot obtain a level V caveat dementia  Physical Exam Updated Vital Signs BP (!) 143/54 (BP Location: Left Arm)   Pulse 63   Temp 97.7 F (36.5 C) (Oral)   Resp 18   SpO2 99%   Physical Exam  Constitutional:  Patient is resting with her eyes closed. She makes an ongoing undulating humming moaning sound.  HENT:  Head: Normocephalic and atraumatic.  Right Ear: External ear normal.  Left Ear:  External ear normal.  Nose: Nose normal.  Mouth/Throat: Oropharynx is clear and moist.  Eyes: EOM are normal.  Neck:  Patient does not express pain response to palpation of the cervical spine.  Cardiovascular: Normal rate, regular rhythm, normal heart sounds and intact distal pulses.   Pulmonary/Chest: Effort normal and breath sounds normal. She exhibits no tenderness.  Patient does not express pain or increased distress with compression of the chest wall. No palpable crepitus.  Abdominal: Soft. She exhibits no distension. There is no tenderness.  Musculoskeletal:  Patient holds all extremities fairly stiffly yesterday body. Can however do some range of motion with the upper extremities in which she will assist in moving her arms and can hold them out once I release them. No evident deformities. She holds the lower extremities quite stiffly extended. No deformities.  Neurological:  Patient resting with her eyes closed but opens them to name. She does not make any intelligible conversation. As patient does not follow commands.  Skin: Skin is warm and dry.     ED Treatments / Results  Labs (all labs ordered are listed, but only abnormal results are displayed) Labs Reviewed - No data to display  EKG  EKG Interpretation None        Radiology No results found.  Procedures Procedures (including critical care time)  Medications Ordered in ED Medications - No data to display   Initial Impression / Assessment and Plan / ED Course  I have reviewed the triage vital signs and the nursing notes.  Pertinent labs & imaging results that were available during my care of the patient were reviewed by me and considered in my medical decision making (see chart for details).      Final Clinical Impressions(s) / ED Diagnoses   Final diagnoses:  Fall, initial encounter  Patient had an unwitnessed fall in nursing home. No specific injury or concern was reported. On examination patient does not have localizing pain or injury. Patient will be returned to nursing facility for ongoing observation and monitoring.  New Prescriptions New Prescriptions   No medications on file     Charlesetta Shanks, MD 05/14/16 1752

## 2016-05-14 NOTE — ED Notes (Signed)
Pt transported  via [facility] . ?

## 2016-05-14 NOTE — ED Notes (Signed)
Bed: [facility] Expected date:  Expected time:  Means of arrival:  Comments: 83 yo f fall

## 2016-05-14 NOTE — ED Triage Notes (Signed)
Per EMS pt had unwitnessed fall. Pt transported from [facility]. Per staff they believe she fell out of wheelchair. Unknown if pt hit head or experienced LOC as pt cannot verbalize events of fall. Pt hx of falls. Pt is moaning at this time, per caregivers this is pts normal.

## 2016-05-14 NOTE — Discharge Instructions (Signed)
No specific injury was identified during her evaluation in the emergency department. Please observe the patient for any changes in her baseline function. Refer to primary provider for recheck in one to 2 days.

## 2016-08-22 ENCOUNTER — Emergency Department (HOSPITAL_COMMUNITY): Payer: Medicare Other

## 2016-08-22 ENCOUNTER — Emergency Department (HOSPITAL_COMMUNITY)
Admission: EM | Admit: 2016-08-22 | Discharge: 2016-08-22 | Disposition: A | Discharge status: Home / Self Care | Payer: Medicare Other | Attending: Emergency Medicine | Admitting: Emergency Medicine

## 2016-08-22 ENCOUNTER — Encounter (HOSPITAL_COMMUNITY): Payer: Self-pay | Admitting: Emergency Medicine

## 2016-08-22 DIAGNOSIS — Z79899 Other long term (current) drug therapy: Secondary | ICD-10-CM | POA: Insufficient documentation

## 2016-08-22 DIAGNOSIS — I120 Hypertensive chronic kidney disease with stage 5 chronic kidney disease or end stage renal disease: Secondary | ICD-10-CM | POA: Diagnosis not present

## 2016-08-22 DIAGNOSIS — Y999 Unspecified external cause status: Secondary | ICD-10-CM | POA: Insufficient documentation

## 2016-08-22 DIAGNOSIS — S0003XA Contusion of scalp, initial encounter: Secondary | ICD-10-CM | POA: Diagnosis not present

## 2016-08-22 DIAGNOSIS — S0990XA Unspecified injury of head, initial encounter: Secondary | ICD-10-CM | POA: Diagnosis present

## 2016-08-22 DIAGNOSIS — Y939 Activity, unspecified: Secondary | ICD-10-CM | POA: Diagnosis not present

## 2016-08-22 DIAGNOSIS — M79605 Pain in left leg: Secondary | ICD-10-CM | POA: Insufficient documentation

## 2016-08-22 DIAGNOSIS — G309 Alzheimer's disease, unspecified: Secondary | ICD-10-CM | POA: Diagnosis not present

## 2016-08-22 DIAGNOSIS — N185 Chronic kidney disease, stage 5: Secondary | ICD-10-CM | POA: Diagnosis not present

## 2016-08-22 DIAGNOSIS — W1830XA Fall on same level, unspecified, initial encounter: Secondary | ICD-10-CM | POA: Diagnosis not present

## 2016-08-22 DIAGNOSIS — E1122 Type 2 diabetes mellitus with diabetic chronic kidney disease: Secondary | ICD-10-CM | POA: Insufficient documentation

## 2016-08-22 DIAGNOSIS — W19XXXA Unspecified fall, initial encounter: Secondary | ICD-10-CM

## 2016-08-22 DIAGNOSIS — Y92129 Unspecified place in nursing home as the place of occurrence of the external cause: Secondary | ICD-10-CM | POA: Diagnosis not present

## 2016-08-22 DIAGNOSIS — M79602 Pain in left arm: Secondary | ICD-10-CM | POA: Insufficient documentation

## 2016-08-22 DIAGNOSIS — M25552 Pain in left hip: Secondary | ICD-10-CM | POA: Insufficient documentation

## 2016-08-22 DIAGNOSIS — M25512 Pain in left shoulder: Secondary | ICD-10-CM | POA: Insufficient documentation

## 2016-08-22 MED ORDER — ACETAMINOPHEN 325 MG PO TABS
650.0000 mg | ORAL_TABLET | Freq: Once | ORAL | Status: AC
  Administered 2016-08-22: 650 mg via ORAL
  Filled 2016-08-22: qty 2

## 2016-08-22 NOTE — ED Notes (Signed)
Patient transported to CT 

## 2016-08-22 NOTE — ED Notes (Signed)
Attempted to call report for pt d/c.  Will try again.

## 2016-08-22 NOTE — ED Triage Notes (Signed)
Pt via EMS from [facility].  Unwitnessed fall, found on floor about 45 minutes ago.  Tenderness in back and knot/swelling on R side of head.  No lacs or obvious signs of deformity.  Hx dementia, at baseline neuro status per staff.  Pt moaning and tensed up in bed.  No blood thinners.  Nonambulatory. VS: 164/82, 76 bpm, 18 RR, 100% RA, CBG 143

## 2016-08-22 NOTE — ED Provider Notes (Signed)
[facility] DEPT Provider Note   CSN: [id number] Arrival date & time: 08/22/16  1617  LEVEL 5 CAVEAT - DEMENTIA   History   Chief Complaint Chief Complaint  Patient presents with  . Fall    HPI [name] Ramos is a 83 y.o. female.  HPI  83 year old female with a history of Alzheimer dementia and multiple prior falls resents with a fall. She had an unwitnessed fall at the facility where they found her lying on the floor. She was noticed to have a knot to the back of her head. On my exam, the patient seems to be in pain in her left lower extremity and left upper extremity during examination but has a hard time pointing to exactly where the pain is. She cannot tell me whether or not she fell or how she fell. She is mostly moaning and speaking garbled speech. Nursing facility reports the patient is at her mental baseline.  Past Medical History:  Diagnosis Date  . Alzheimer disease   . Anemia   . Anxiety disorder   . Arthritis   . Cataracts, bilateral   . Chronic constipation   . Chronic kidney disease, stage IV (severe) 12/14/2012  . Chronic pain   . Diabetes mellitus without complication ([place])   . Fecal impaction ([place]) 12/15/2012  . Hearing impaired   . Heart block   . Hemorrhoids   . Hypertension   . Peripheral vascular disease ([place])   . Protein-calorie malnutrition, severe ([place]) 12/16/2012  . Recurrent falls   . Underweight 12/17/2012  . Vitamin B 12 deficiency   . Vitamin D deficiency     Patient Active Problem List   Diagnosis Date Noted  . SVT (supraventricular tachycardia) ([place]) 08/12/2013  . Anxiety state, unspecified 02/01/2013  . Essential thrombocythemia ([place]) 02/01/2013  . Unspecified constipation 01/21/2013  . Secondary renovascular hypertension, benign 01/18/2013  . Anemia in chronic kidney disease(285.21) 01/18/2013  . Underweight 12/17/2012  . Protein-calorie malnutrition, severe ([place]) 12/16/2012  . Fecal impaction ([place]) 12/15/2012  . Chronic  kidney disease, stage IV (severe) ([place]) 12/14/2012  . Alzheimer disease   . Hypertension   . Arthritis   . Hemorrhoids     Past Surgical History:  Procedure Laterality Date  . APPENDECTOMY    . CATARACT EXTRACTION, BILATERAL    . ORIF RADIAL FRACTURE Left 08/30/2015   Procedure: OPEN REDUCTION INTERNAL FIXATION (ORIF) LEFT RADIUS AND ULNA FRACTURES;  Surgeon: Leandrew Koyanagi, MD;  Location: [facility];  Service: Orthopedics;  Laterality: Left;  . TONSILLECTOMY      OB History    No data available       Home Medications    Prior to Admission medications   Medication Sig Start Date End Date Taking? Authorizing Provider  acetaminophen (TYLENOL) 500 MG tablet Take 500 mg by mouth every 4 (four) hours as needed for moderate pain.   Yes [provider]  alum & mag hydroxide-simeth (MAALOX/MYLANTA) 200-200-20 MG/5ML suspension Take 30 mLs by mouth every 6 (six) hours as needed for indigestion or heartburn.   Yes [provider]  amLODipine-valsartan (EXFORGE) 10-160 MG per tablet Take 1 tablet by mouth daily.    Yes [provider]  cholecalciferol (VITAMIN D) 1000 UNITS tablet Take 1,000 Units by mouth daily.    Yes [provider]  citalopram (CELEXA) 10 MG tablet Take 15 mg by mouth daily.   Yes [provider]  divalproex (DEPAKOTE SPRINKLE) 125 MG capsule Take 125-250 mg by mouth  2 (two) times daily. Order is written as take 250mg  by mouth twice daily (8am and 8pm) and then another order is written for 125mg  by mouth at night (8pm) for a total of 375 mg at night   Yes [provider]  docusate sodium (COLACE) 100 MG capsule Take 100 mg by mouth daily.   Yes [provider]  ferrous sulfate 325 (65 FE) MG tablet Take 325 mg by mouth daily.    Yes [provider]  guaifenesin (ROBITUSSIN) 100 MG/5ML syrup Take 200 mg by mouth every 6 (six) hours as needed for cough.   Yes [provider]  lidocaine (LIDODERM) 5 %  Place 1 patch onto the skin daily. Remove & Discard patch within 12 hours or as directed by MD (apply every morning and remove every night    Yes [provider]  loperamide (IMODIUM) 2 MG capsule Take 2 mg by mouth as needed for diarrhea or loose stools.   Yes [provider]  LORazepam (ATIVAN) 0.5 MG tablet Take 0.25 mg by mouth 2 (two) times daily.  08/11/13  Yes [provider]  menthol-cetylpyridinium (CEPACOL) 3 MG lozenge Take 1 lozenge by mouth daily as needed for sore throat.   Yes [provider]  metoprolol succinate (TOPROL-XL) 25 MG 24 hr tablet Take 12.5 mg by mouth daily.  08/09/13  Yes [provider]  neomycin-bacitracin-polymyxin (NEOSPORIN) [phone] ointment Apply 1 application topically as needed (minor skin tears or abrasions).   Yes [provider]  ondansetron (ZOFRAN) 4 MG tablet Take 4 mg by mouth every 8 (eight) hours as needed for nausea or vomiting.   Yes [provider]  polyethylene glycol (MIRALAX / GLYCOLAX) packet Take 17 g by mouth daily as needed for mild constipation. Mix in 4 oz of water and drink   Yes [provider]  polyvinyl alcohol (LIQUIFILM TEARS) 1.4 % ophthalmic solution Place 2 drops into both eyes every 2 (two) hours as needed for dry eyes.   Yes [provider]  Skin Protectants, Misc. (EUCERIN) cream Apply 1 application topically 2 (two) times daily.   Yes [provider]  triamcinolone cream (KENALOG) 0.1 % Apply 1 application topically 3 (three) times daily as needed (dry skin).   Yes [provider]  vitamin C (ASCORBIC ACID) 500 MG tablet Take 500 mg by mouth daily.    Yes [provider]  ciprofloxacin (CIPRO) 500 MG tablet Take 1 tablet (500 mg total) by mouth every 12 (twelve) hours. Patient not taking: Reported on 03/16/2016 11/09/15   Alfonzo Beers, MD    Family History Family History  Problem Relation Age of Onset  . Breast cancer  Unknown        multiple  . Dementia Sister   . High blood pressure Unknown        multiple  . Diabetes Unknown        multiple  . Colon cancer Neg Hx     Social History Social History  Substance Use Topics  . Smoking status: Never Smoker  . Smokeless tobacco: Never Used  . Alcohol use No     Allergies   Ace inhibitors; Codeine; Penicillins; Sulfa antibiotics; and Tramadol   Review of Systems Review of Systems  Unable to perform ROS: Dementia     Physical Exam Updated Vital Signs BP (!) 135/46   Pulse 70   Temp 97.8 F (36.6 C) (Oral)   Resp 18   SpO2 100%  Physical Exam  Constitutional: She appears well-developed and well-nourished.  HENT:  Head: Normocephalic.  Right Ear: External ear normal.  Left Ear: External ear normal.  Nose: Nose normal.  Right occiput swelling and tenderness, no laceration  Eyes: Right eye exhibits no discharge. Left eye exhibits no discharge.  Neck: Neck supple. No spinous process tenderness and no muscular tenderness present.  Cardiovascular: Normal rate, regular rhythm and normal heart sounds.   Pulses:      Radial pulses are 2+ on the right side, and 2+ on the left side.       Dorsalis pedis pulses are 2+ on the right side, and 2+ on the left side.  Pulmonary/Chest: Effort normal and breath sounds normal.  Abdominal: Soft. There is no tenderness.  Musculoskeletal: She exhibits no deformity.  No obvious extremity swelling or deformity She has pain with ROM of left leg, mostly seems to be knee/distal thigh but difficult to tell. She resists trying to range her knee and hip. No bruising Some pain in LUE, points to left upper arm. Normal ROM of elbow, no swelling/tenderness to left forearm, wrist. Some decreased ROM of shoulder due to pain, no deformity  Neurological: She is alert. She is disoriented.  Awake, alert, does not follow commands. Speech is nonsensical, frequently moaning.  Skin: Skin is warm and dry.  Nursing note and  vitals reviewed.    ED Treatments / Results  Labs (all labs ordered are listed, but only abnormal results are displayed) Labs Reviewed - No data to display  EKG  EKG Interpretation None       Radiology Dg Tibia/fibula Left  Result Date: 08/22/2016 CLINICAL DATA:  Pain after trauma EXAM: LEFT TIBIA AND FIBULA - 2 VIEW COMPARISON:  None. FINDINGS: Vascular calcifications are identified. The patella is high-riding on the lateral view, likely due to imaging with the leg straight. No joint effusion identified. No fracture is noted. IMPRESSION: The patella is high-riding, likely due to positioning. Recommend clinical correlation. No fracture noted. Electronically Signed   By: Dorise Bullion III M.D   On: 08/22/2016 19:10   Ct Head Wo Contrast  Result Date: 08/22/2016 CLINICAL DATA:  83 year old female with head and neck injury following fall. Initial encounter. EXAM: CT HEAD WITHOUT CONTRAST CT CERVICAL SPINE WITHOUT CONTRAST TECHNIQUE: Multidetector CT imaging of the head and cervical spine was performed following the standard protocol without intravenous contrast. Multiplanar CT image reconstructions of the cervical spine were also generated. COMPARISON:  03/16/2016 and prior CTs FINDINGS: CT HEAD FINDINGS Brain: No evidence of acute infarction, hemorrhage, hydrocephalus, extra-axial collection or mass lesion/mass effect. Atrophy and chronic small-vessel white matter ischemic changes noted. Vascular: No hyperdense vessel or unexpected calcification. Skull: Normal. Negative for fracture or focal lesion. Sinuses/Orbits: No acute finding. Other: Posterior right scalp soft tissue swelling noted. CT CERVICAL SPINE FINDINGS Alignment: 3 mm anterolisthesis of C3 on C4 and C4 on C5 is unchanged. No new subluxation identified. Skull base and vertebrae: No acute fracture. No primary bone lesion or focal pathologic process. Soft tissues and spinal canal: No prevertebral fluid or swelling. No visible canal  hematoma. Disc levels: Mild multilevel degenerative disc disease, spondylosis and facet arthropathy again noted. Upper chest: No acute abnormality Other: None IMPRESSION: Posterior right scalp soft tissue swelling without fracture. No evidence of acute intracranial abnormality. Atrophy and chronic small-vessel white matter ischemic changes. No evidence of acute injury to the cervical spine. Mild spondylolisthesis and degenerative changes again noted. Electronically Signed   By: Dellis Filbert  Hu M.D.   On: 08/22/2016 18:05   Ct Cervical Spine Wo Contrast  Result Date: 08/22/2016 CLINICAL DATA:  83 year old female with head and neck injury following fall. Initial encounter. EXAM: CT HEAD WITHOUT CONTRAST CT CERVICAL SPINE WITHOUT CONTRAST TECHNIQUE: Multidetector CT imaging of the head and cervical spine was performed following the standard protocol without intravenous contrast. Multiplanar CT image reconstructions of the cervical spine were also generated. COMPARISON:  03/16/2016 and prior CTs FINDINGS: CT HEAD FINDINGS Brain: No evidence of acute infarction, hemorrhage, hydrocephalus, extra-axial collection or mass lesion/mass effect. Atrophy and chronic small-vessel white matter ischemic changes noted. Vascular: No hyperdense vessel or unexpected calcification. Skull: Normal. Negative for fracture or focal lesion. Sinuses/Orbits: No acute finding. Other: Posterior right scalp soft tissue swelling noted. CT CERVICAL SPINE FINDINGS Alignment: 3 mm anterolisthesis of C3 on C4 and C4 on C5 is unchanged. No new subluxation identified. Skull base and vertebrae: No acute fracture. No primary bone lesion or focal pathologic process. Soft tissues and spinal canal: No prevertebral fluid or swelling. No visible canal hematoma. Disc levels: Mild multilevel degenerative disc disease, spondylosis and facet arthropathy again noted. Upper chest: No acute abnormality Other: None IMPRESSION: Posterior right scalp soft tissue swelling  without fracture. No evidence of acute intracranial abnormality. Atrophy and chronic small-vessel white matter ischemic changes. No evidence of acute injury to the cervical spine. Mild spondylolisthesis and degenerative changes again noted. Electronically Signed   By: Margarette Canada M.D.   On: 08/22/2016 18:05   Dg Shoulder Left  Result Date: 08/22/2016 CLINICAL DATA:  Unwitnessed fall.  Pain. EXAM: LEFT SHOULDER - 2+ VIEW COMPARISON:  None. FINDINGS: There is no evidence of fracture or dislocation. There is no evidence of arthropathy or other focal bone abnormality. Soft tissues are unremarkable. IMPRESSION: Negative. Electronically Signed   By: Dorise Bullion III M.D   On: 08/22/2016 19:05   Dg Knee Complete 4 Views Left  Result Date: 08/22/2016 CLINICAL DATA:  Pain after fall EXAM: LEFT KNEE - COMPLETE 4+ VIEW COMPARISON:  None. FINDINGS: The study is limited as a lateral view was not obtained. Irregularity in the medial compartment is likely degenerative. No acute fracture is seen. Vascular calcifications are noted. IMPRESSION: The study is limited as above. Degenerative changes in the medial compartment. No evidence of fracture. Electronically Signed   By: Dorise Bullion III M.D   On: 08/22/2016 19:09   Dg Humerus Left  Result Date: 08/22/2016 CLINICAL DATA:  Pain after fall EXAM: LEFT HUMERUS - 2+ VIEW COMPARISON:  None. FINDINGS: There is no evidence of fracture or other focal bone lesions. Soft tissues are unremarkable. IMPRESSION: Negative. Electronically Signed   By: Dorise Bullion III M.D   On: 08/22/2016 19:06   Dg Hip Unilat With Pelvis 2-3 Views Left  Result Date: 08/22/2016 CLINICAL DATA:  Pain after fall EXAM: DG HIP (WITH OR WITHOUT PELVIS) 2-3V LEFT COMPARISON:  None. FINDINGS: There is no evidence of hip fracture or dislocation. There is no evidence of arthropathy or other focal bone abnormality. IMPRESSION: Negative. Electronically Signed   By: Dorise Bullion III M.D   On: 08/22/2016  19:07   Dg Femur Min 2 Views Left  Result Date: 08/22/2016 CLINICAL DATA:  Pain after trauma EXAM: LEFT FEMUR 2 VIEWS COMPARISON:  None. FINDINGS: Vascular calcifications.  No fractures. IMPRESSION: Negative. Electronically Signed   By: Dorise Bullion III M.D   On: 08/22/2016 19:11    Procedures Procedures (including critical care time)  Medications  Ordered in ED Medications  acetaminophen (TYLENOL) tablet 650 mg (650 mg Oral Given 08/22/16 1727)     Initial Impression / Assessment and Plan / ED Course  I have reviewed the triage vital signs and the nursing notes.  Pertinent labs & imaging results that were available during my care of the patient were reviewed by me and considered in my medical decision making (see chart for details).     CTs and Xrays unremarkable. Knee xray limited, but on re-eval I am able to fully range her hip and knee without inducing pain or difficulty. Thus suspicion of fracture is low. Frequent falls, with benign vital signs, I don't think further workup is needed. D/c back to facility.  Final Clinical Impressions(s) / ED Diagnoses   Final diagnoses:  Fall, initial encounter  Contusion of scalp, initial encounter    New Prescriptions Discharge Medication List as of 08/22/2016  7:59 PM       Sherwood Gambler, MD 08/23/16 [phone]

## 2016-08-22 NOTE — ED Notes (Signed)
Bed: GO77 Expected date:  Expected time:  Means of arrival:  Comments: EMS 83 y/o fall, dementia

## 2016-08-22 NOTE — ED Notes (Signed)
Called report.  [facility] taking pt home.

## 2016-08-22 NOTE — ED Notes (Signed)
Bed: [facility] Expected date:  Expected time:  Means of arrival:  Comments: 

## 2016-11-15 IMAGING — CR DG CHEST 2V
2 series · 2 of 2 positions shown · non-contrast
Comparison: 07/18/2015

CLINICAL DATA: [AGE] female with productive cough.

EXAM:
CHEST  2 VIEW

[w chest lat]
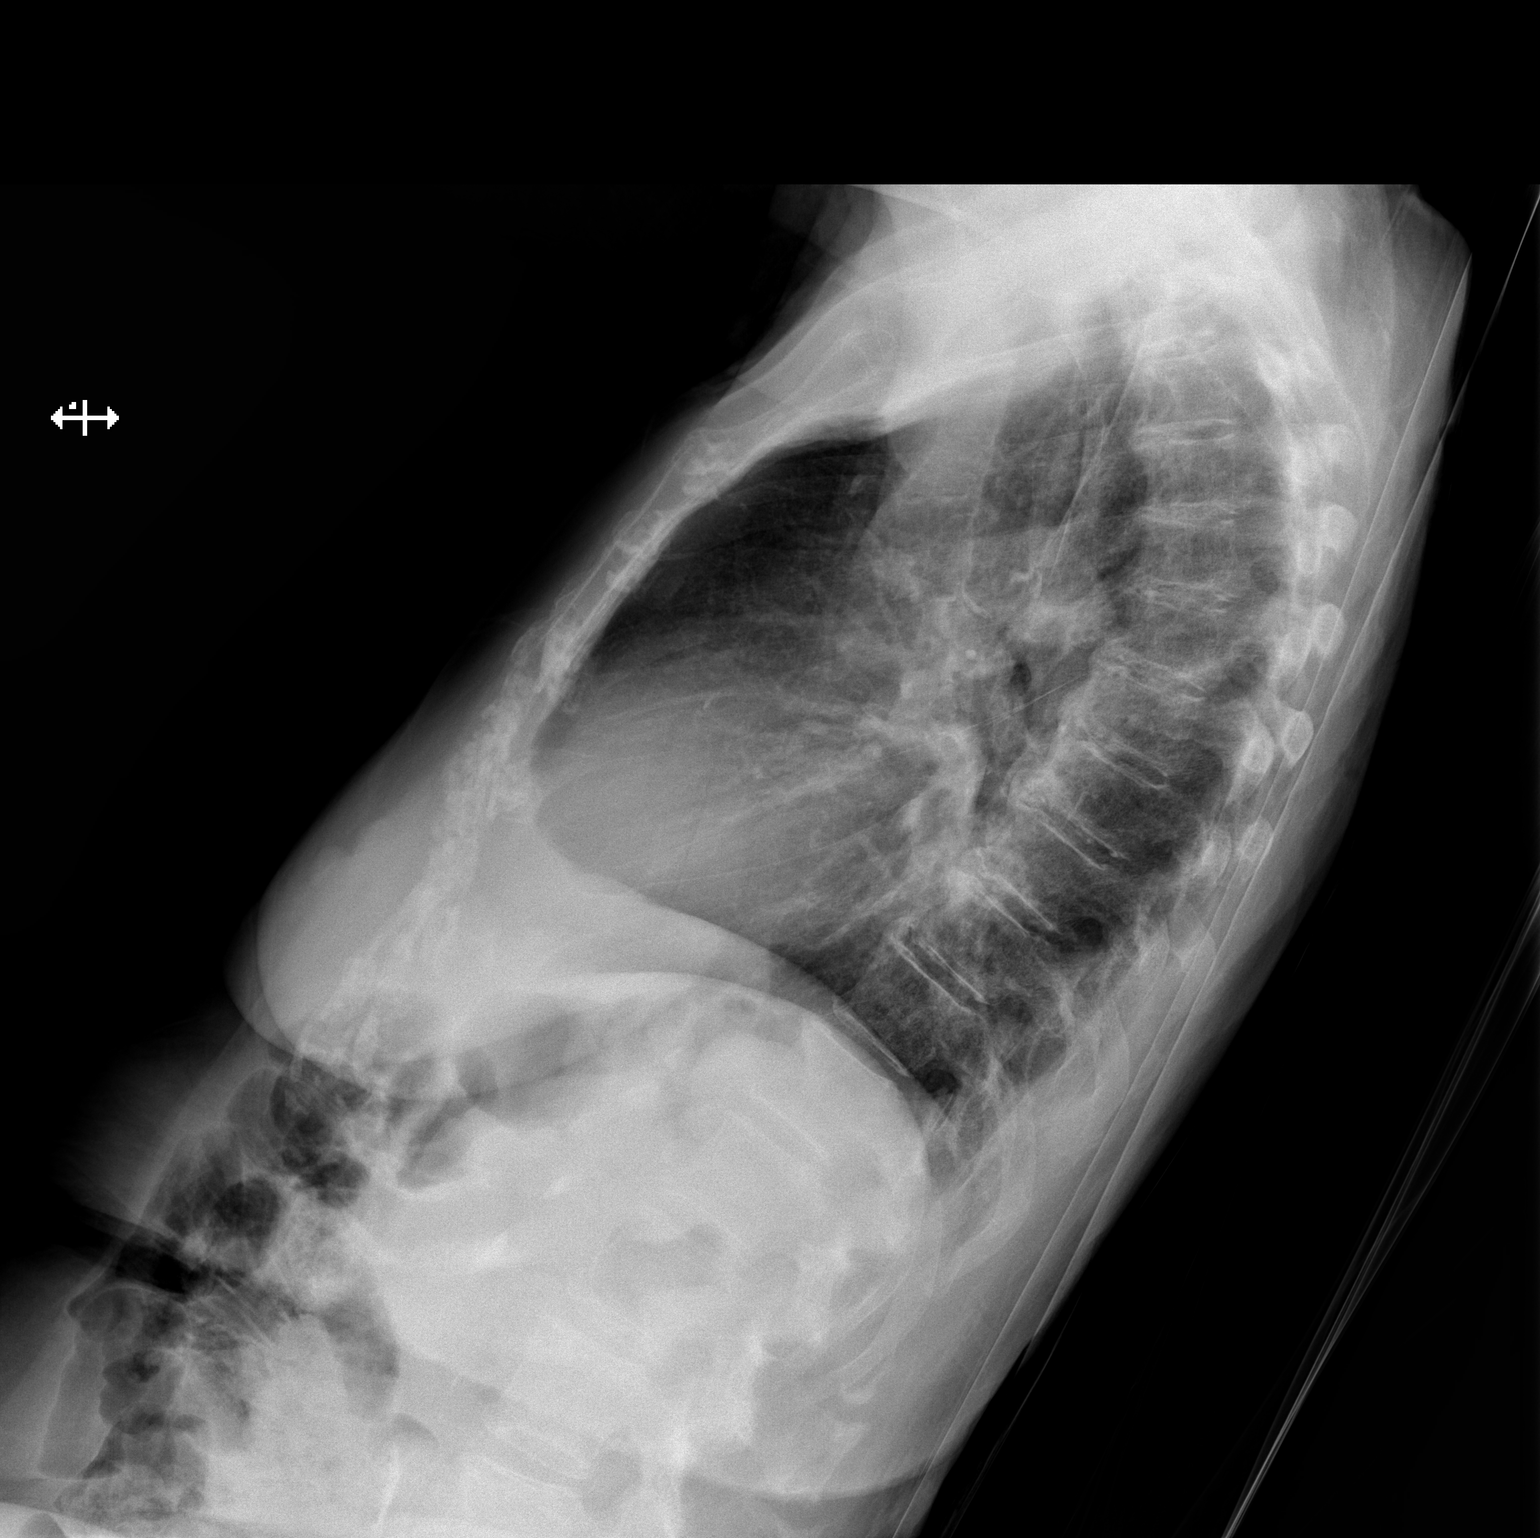

[x chest ap]
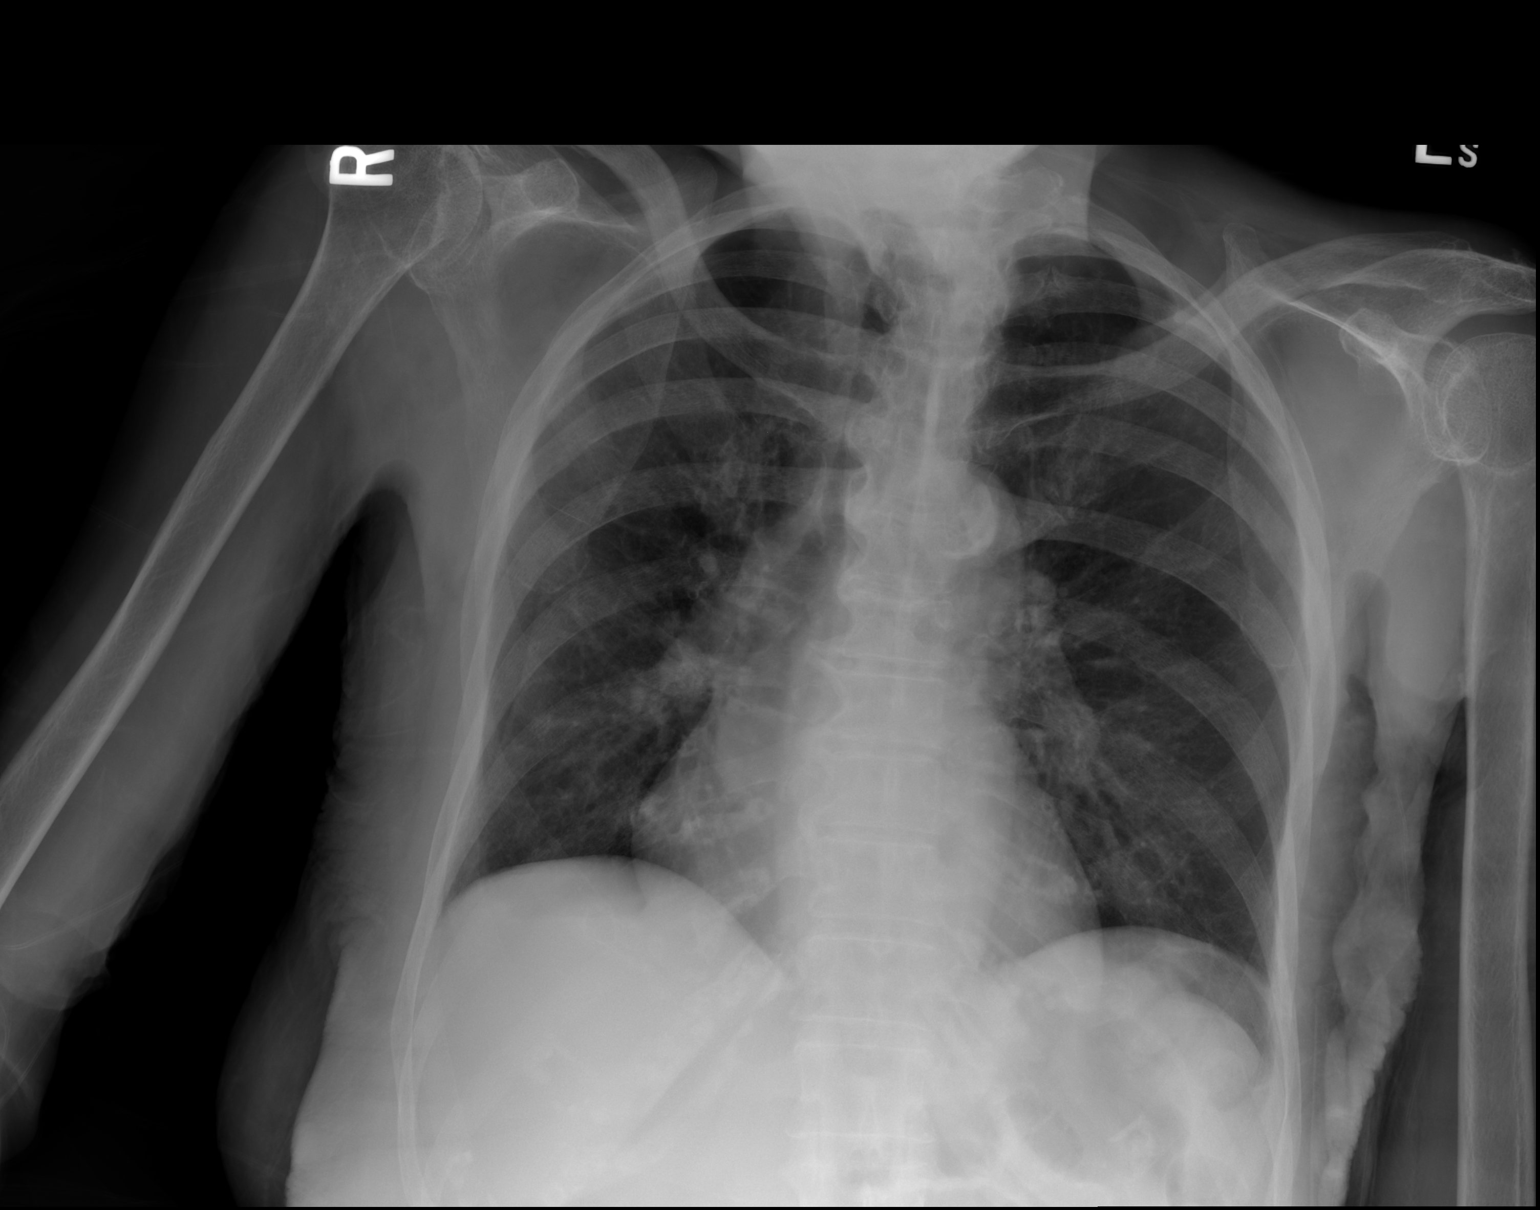

[2 of 2 positions shown; findings below may reference images not displayed]

FINDINGS: The cardiomediastinal contours are unchanged, mild atherosclerosis
of the aortic arch. The lungs are clear. Pulmonary vasculature is
normal. No consolidation, pleural effusion, or pneumothorax. A skin
fold projects over the right hemithorax. No acute osseous
abnormalities are seen. Chronic change about the right shoulder.
IMPRESSION: No active cardiopulmonary disease.

## 2016-12-01 IMAGING — CT CT HEAD W/O CM
4 series · 19 of 47 positions shown, 21 images · non-contrast
Comparison: 08/21/2015 CT.

CLINICAL DATA: [AGE] female with unwitnessed fall in
bathroom. Hypertension, diabetes and dementia. Subsequent encounter.

EXAM:
CT HEAD WITHOUT CONTRAST
TECHNIQUE: Contiguous axial images were obtained from the base of the skull
through the vertex without intravenous contrast.

[Series 201: head w/o, idose (1) · axial · non-contrast · 0.43mm/px · z∈[+164,+274]mm · 8 of 30 slices shown, 10 images]
[im 4/30  brain]
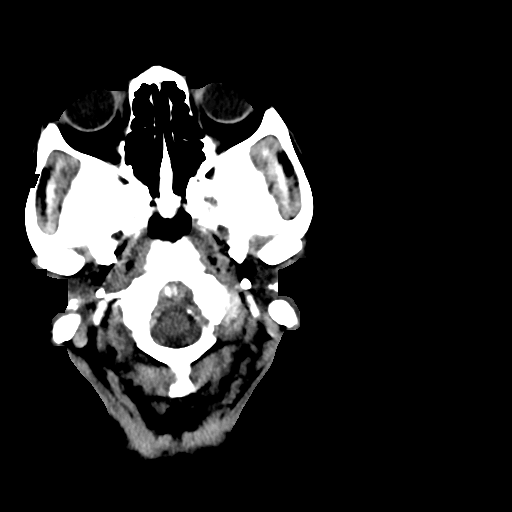
[im 4/30  bone]
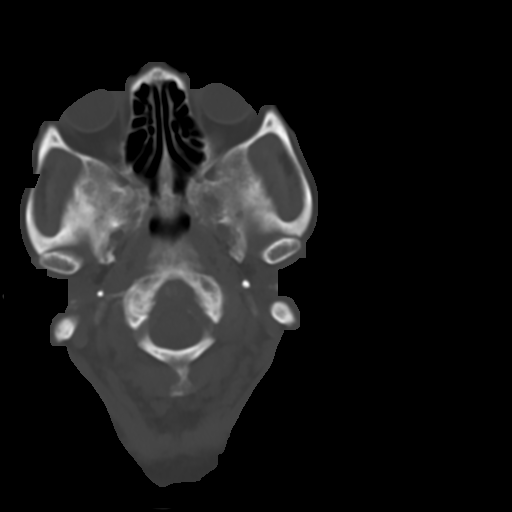
[im 7/30  brain]
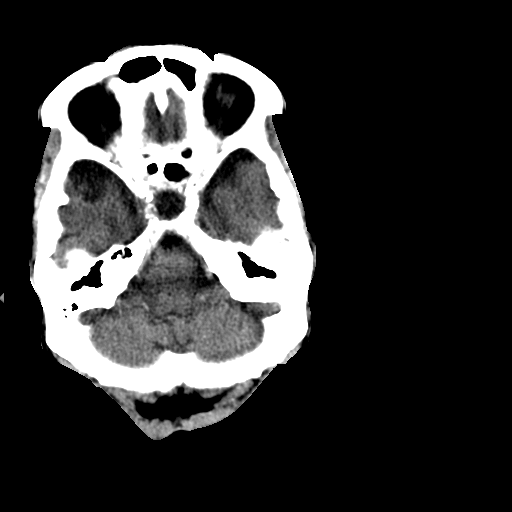
[im 10/30  brain]
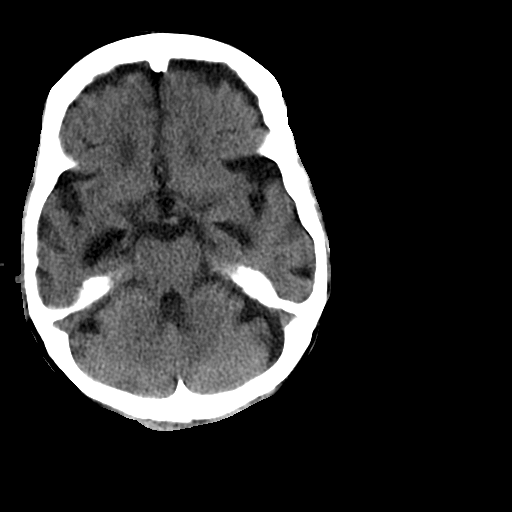
[im 13/30  brain]
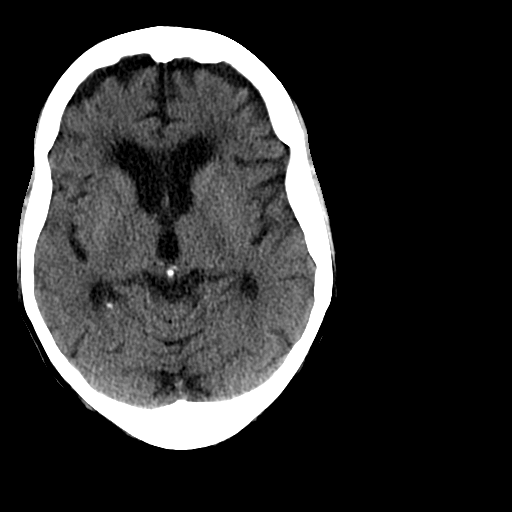
[im 17/30  brain]
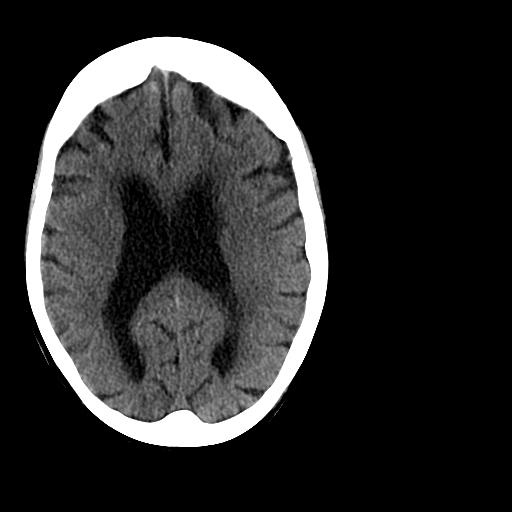
[im 17/30  bone]
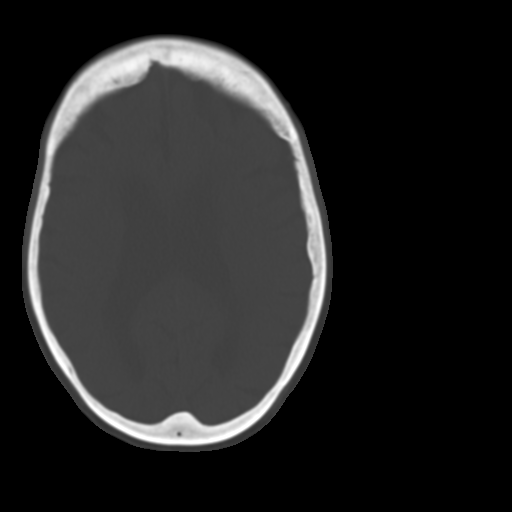
[im 20/30  brain]
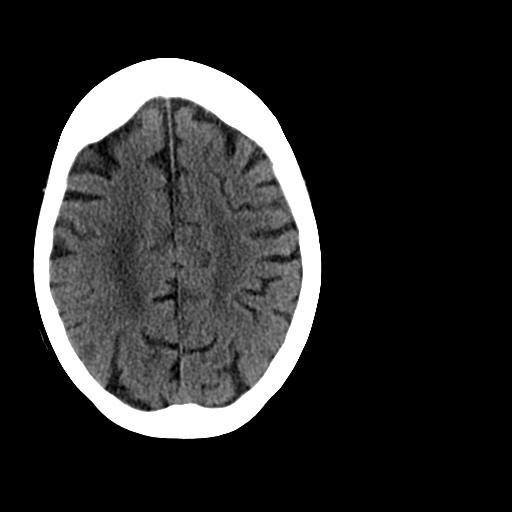
[im 23/30  brain]
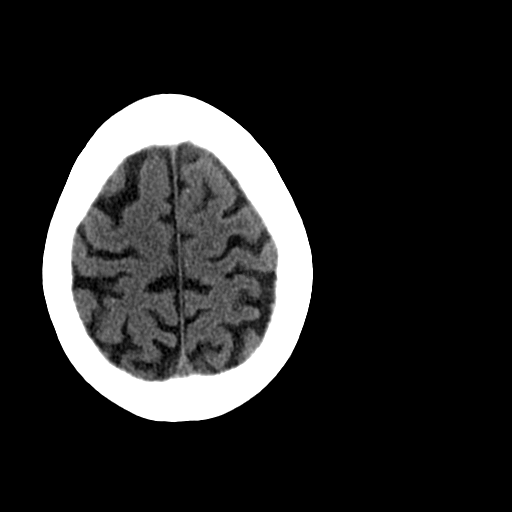
[im 26/30  brain]
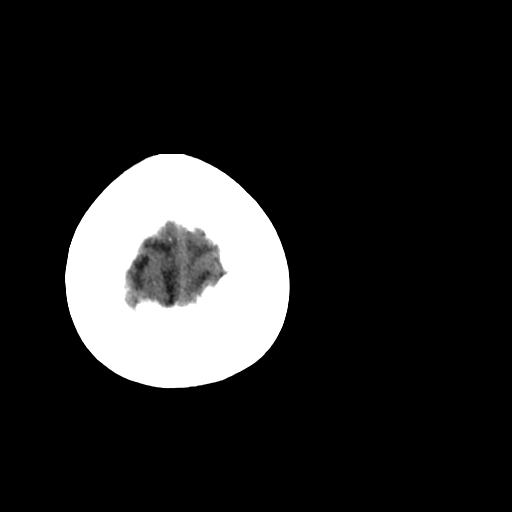

[Series 202: head w/o bone, idose (1) · axial · non-contrast · 0.43mm/px · z∈[+163,+233]mm · 5 of 60 slices shown]
[im 7/60  bone]
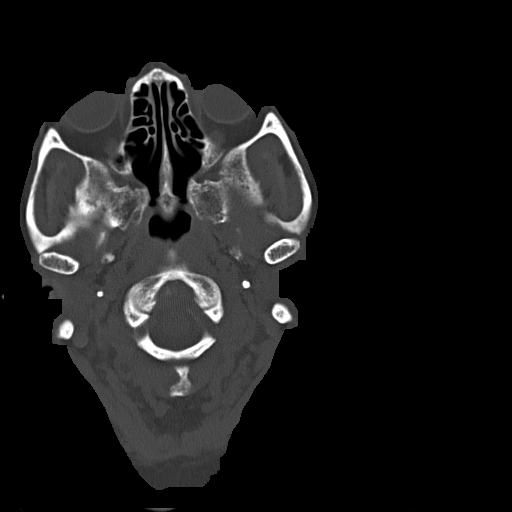
[im 13/60  bone]
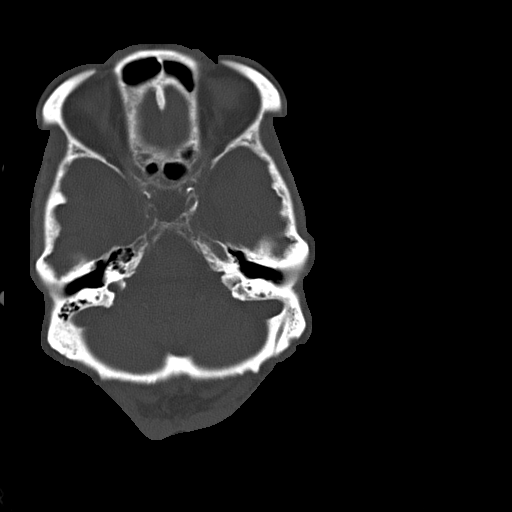
[im 19/60  bone]
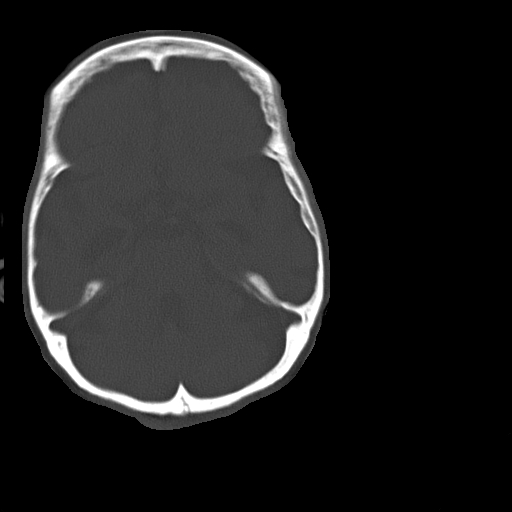
[im 25/60  bone]
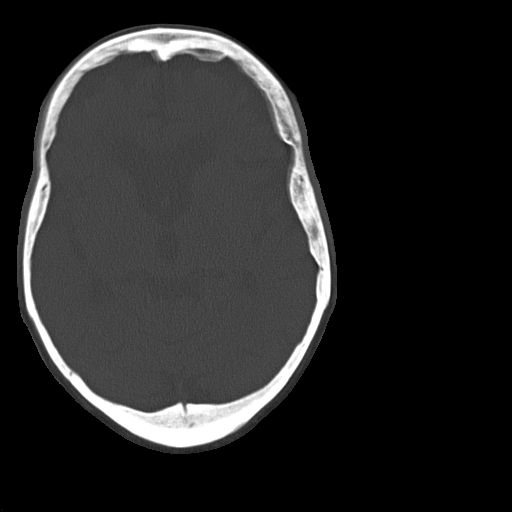
[im 35/60  bone]
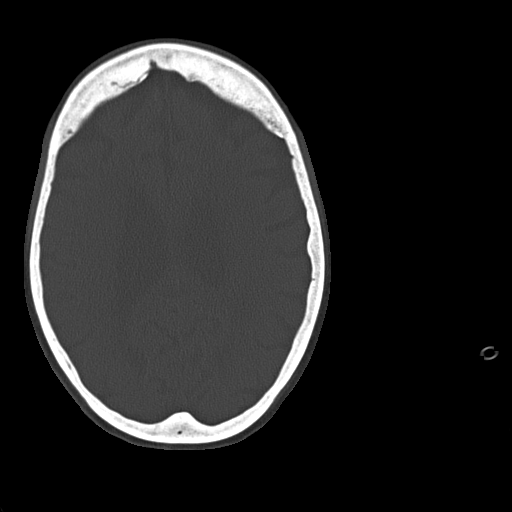

[Series 203: coronal st, idose (1) · coronal · 0.40mm/px · 3 of 70 slices shown]
[im 24/70  brain]
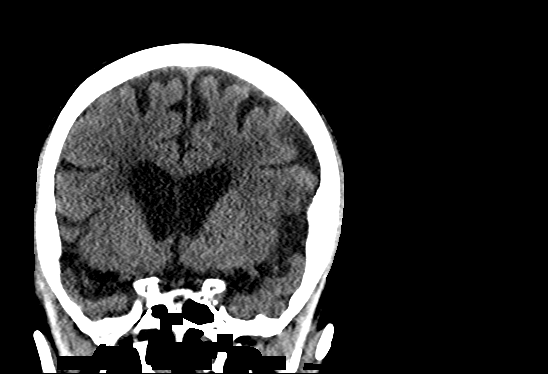
[im 31/70  brain]
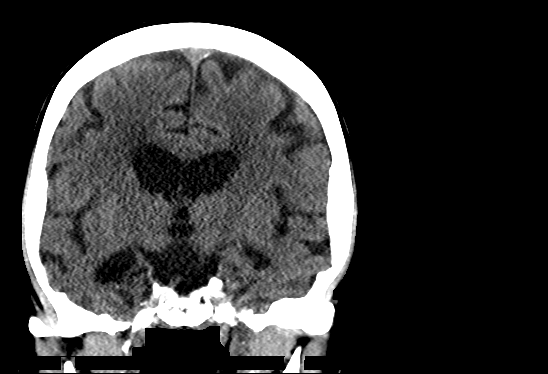
[im 39/70  brain]
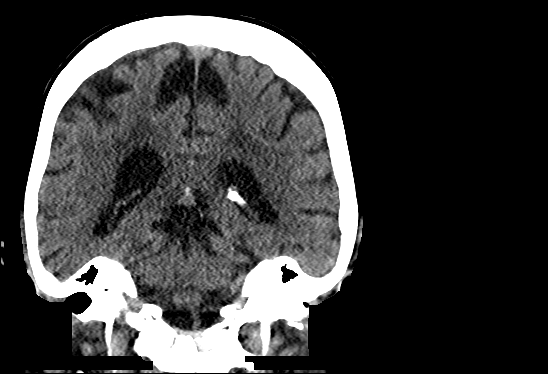

[Series 204: sagittal st, idose (1) · sagittal · 0.40mm/px · 3 of 73 slices shown]
[im 25/73  brain]
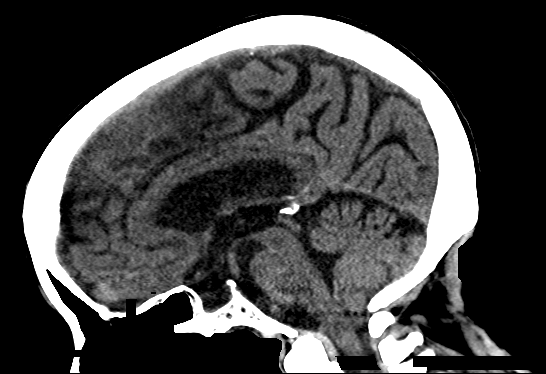
[im 37/73  brain]
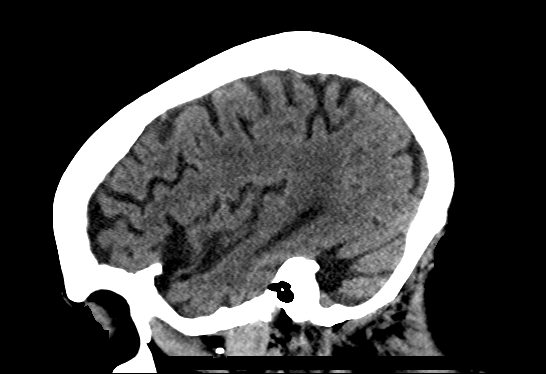
[im 49/73  brain]
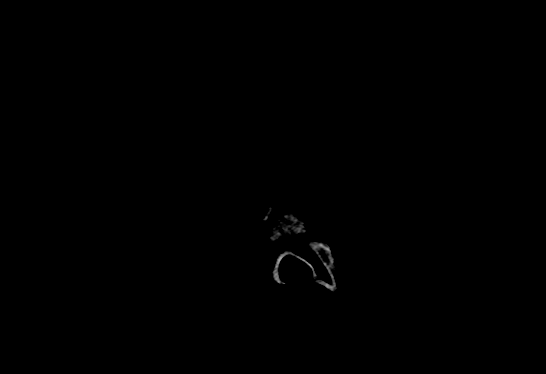

[19 of 47 positions shown; findings below may reference images not displayed]

FINDINGS: No skull fracture or intracranial hemorrhage.

Chronic microvascular changes without CT evidence of large acute
infarct.

Moderate global atrophy with ventricular prominence similar to prior
exam and probably related to central atrophy.

No intracranial mass lesion noted on this unenhanced exam.

Vascular calcifications.

No acute orbital abnormality.

Mastoid air cells, middle ear cavities and visualized paranasal
sinuses are clear.
IMPRESSION: No acute intracranial abnormality.

## 2016-12-26 ENCOUNTER — Encounter (HOSPITAL_COMMUNITY): Payer: Self-pay

## 2016-12-26 ENCOUNTER — Emergency Department (HOSPITAL_COMMUNITY): Payer: Medicare Other

## 2016-12-26 ENCOUNTER — Emergency Department (HOSPITAL_COMMUNITY)
Admission: EM | Admit: 2016-12-26 | Discharge: 2016-12-26 | Disposition: A | Discharge status: Skilled Nursing Facility | Payer: Medicare Other | Attending: Emergency Medicine | Admitting: Emergency Medicine

## 2016-12-26 ENCOUNTER — Other Ambulatory Visit: Payer: Self-pay

## 2016-12-26 DIAGNOSIS — W19XXXA Unspecified fall, initial encounter: Secondary | ICD-10-CM | POA: Insufficient documentation

## 2016-12-26 DIAGNOSIS — Y999 Unspecified external cause status: Secondary | ICD-10-CM | POA: Insufficient documentation

## 2016-12-26 DIAGNOSIS — S0181XA Laceration without foreign body of other part of head, initial encounter: Secondary | ICD-10-CM | POA: Diagnosis not present

## 2016-12-26 DIAGNOSIS — Z79899 Other long term (current) drug therapy: Secondary | ICD-10-CM | POA: Insufficient documentation

## 2016-12-26 DIAGNOSIS — G309 Alzheimer's disease, unspecified: Secondary | ICD-10-CM | POA: Diagnosis not present

## 2016-12-26 DIAGNOSIS — S0990XA Unspecified injury of head, initial encounter: Secondary | ICD-10-CM | POA: Diagnosis present

## 2016-12-26 DIAGNOSIS — Y92128 Other place in nursing home as the place of occurrence of the external cause: Secondary | ICD-10-CM | POA: Insufficient documentation

## 2016-12-26 DIAGNOSIS — N184 Chronic kidney disease, stage 4 (severe): Secondary | ICD-10-CM | POA: Insufficient documentation

## 2016-12-26 DIAGNOSIS — Y9389 Activity, other specified: Secondary | ICD-10-CM | POA: Insufficient documentation

## 2016-12-26 DIAGNOSIS — I129 Hypertensive chronic kidney disease with stage 1 through stage 4 chronic kidney disease, or unspecified chronic kidney disease: Secondary | ICD-10-CM | POA: Diagnosis not present

## 2016-12-26 MED ORDER — BACITRACIN ZINC 500 UNIT/GM EX OINT: 1.0000 "application " | TOPICAL_OINTMENT | Freq: Two times a day (BID) | CUTANEOUS | 0 refills | Status: AC

## 2016-12-26 MED ORDER — LIDOCAINE-EPINEPHRINE 2 %-1:100000 IJ SOLN
20.0000 mL | Freq: Once | INTRAMUSCULAR | Status: AC
  Administered 2016-12-26: 20 mL via INTRADERMAL
  Filled 2016-12-26: qty 20

## 2016-12-26 NOTE — ED Provider Notes (Signed)
[facility] DEPT Provider Note   CSN: [id number] Arrival date & time: 12/26/16  3762     History   Chief Complaint Chief Complaint  Patient presents with  . Fall    HPI [name] Ramos is a 83 y.o. female.  HPI   83 year old female with past medical history as below including severe dementia here with right facial pain.  The patient reportedly had an unwitnessed fall in her room.  She was found on the ground with an abrasion and laceration to her right brow.  She was otherwise at her baseline mental status.  Patient has not had any recent fevers or chills.  She is otherwise at her baseline.  She was last seen normal at 630 on first rounds.  She denies any complaints but is unable to provide history due to dementia.  Level 5 caveat invoked as remainder of history, ROS, and physical exam limited due to patient's dementia.   Past Medical History:  Diagnosis Date  . Alzheimer disease   . Anemia   . Anxiety disorder   . Arthritis   . Cataracts, bilateral   . Chronic constipation   . Chronic kidney disease, stage IV (severe) 12/14/2012  . Chronic pain   . Diabetes mellitus without complication ([place])   . Fecal impaction ([place]) 12/15/2012  . Hearing impaired   . Heart block   . Hemorrhoids   . Hypertension   . Peripheral vascular disease ([place])   . Protein-calorie malnutrition, severe ([place]) 12/16/2012  . Recurrent falls   . Underweight 12/17/2012  . Vitamin B 12 deficiency   . Vitamin D deficiency     Patient Active Problem List   Diagnosis Date Noted  . SVT (supraventricular tachycardia) ([place]) 08/12/2013  . Anxiety state, unspecified 02/01/2013  . Essential thrombocythemia ([place]) 02/01/2013  . Unspecified constipation 01/21/2013  . Secondary renovascular hypertension, benign 01/18/2013  . Anemia in chronic kidney disease(285.21) 01/18/2013  . Underweight 12/17/2012  . Protein-calorie malnutrition, severe ([place]) 12/16/2012  . Fecal  impaction ([place]) 12/15/2012  . Chronic kidney disease, stage IV (severe) ([place]) 12/14/2012  . Alzheimer disease   . Hypertension   . Arthritis   . Hemorrhoids     Past Surgical History:  Procedure Laterality Date  . APPENDECTOMY    . CATARACT EXTRACTION, BILATERAL    . TONSILLECTOMY      OB History    No data available       Home Medications    Prior to Admission medications   Medication Sig Start Date End Date Taking? Authorizing Provider  acetaminophen (TYLENOL) 500 MG tablet Take 500 mg by mouth every 4 (four) hours as needed for moderate pain.   Yes [provider]  alum & mag hydroxide-simeth (MAALOX/MYLANTA) 200-200-20 MG/5ML suspension Take 30 mLs by mouth every 6 (six) hours as needed for indigestion or heartburn.   Yes [provider]  amLODipine-valsartan (EXFORGE) 10-160 MG per tablet Take 1 tablet by mouth daily.    Yes [provider]  cholecalciferol (VITAMIN D) 1000 UNITS tablet Take 1,000 Units by mouth daily.    Yes [provider]  citalopram (CELEXA) 10 MG tablet Take 15 mg by mouth daily.   Yes [provider]  diclofenac sodium (VOLTAREN) 1 % GEL Apply 4 g 2 (two) times daily as needed topically (pain in legs).   Yes [provider]  divalproex (DEPAKOTE SPRINKLE) 125 MG capsule Take 125-250 mg 2 (two) times daily by mouth. Order is  written as take 250mg  by mouth twice daily (9am and 9pm) and then another order is written for 125mg  by mouth at night (9pm) for a total of 375 mg at night   Yes [provider]  docusate sodium (COLACE) 100 MG capsule Take 100 mg at bedtime by mouth.    Yes [provider]  ferrous sulfate 325 (65 FE) MG tablet Take 325 mg by mouth daily.    Yes [provider]  guaifenesin (ROBITUSSIN) 100 MG/5ML syrup Take 200 mg by mouth every 6 (six) hours as needed for cough.   Yes [provider]  lidocaine (LIDODERM) 5 % Place 1 patch onto the skin daily.  Remove & Discard patch within 12 hours or as directed by MD (apply every morning and remove every night    Yes [provider]  loperamide (IMODIUM) 2 MG capsule Take 2 mg by mouth as needed for diarrhea or loose stools.   Yes [provider]  LORazepam (ATIVAN) 0.5 MG tablet Take 0.25 mg by mouth 2 (two) times daily.  08/11/13  Yes [provider]  magnesium hydroxide (MILK OF MAGNESIA) 400 MG/5ML suspension Take 30 mLs at bedtime as needed by mouth for mild constipation.   Yes [provider]  menthol-cetylpyridinium (CEPACOL) 3 MG lozenge Take 1 lozenge by mouth daily as needed for sore throat.   Yes [provider]  metoprolol succinate (TOPROL-XL) 25 MG 24 hr tablet Take 12.5 mg by mouth daily.  08/09/13  Yes [provider]  neomycin-bacitracin-polymyxin (NEOSPORIN) [phone] ointment Apply 1 application topically as needed (minor skin tears or abrasions).   Yes [provider]  ondansetron (ZOFRAN) 4 MG tablet Take 4 mg by mouth every 8 (eight) hours as needed for nausea or vomiting.   Yes [provider]  polyethylene glycol (MIRALAX / GLYCOLAX) packet Take 17 g by mouth daily as needed for mild constipation. Mix in 4 oz of water and drink   Yes [provider]  polyvinyl alcohol (LIQUIFILM TEARS) 1.4 % ophthalmic solution Place 2 drops into both eyes every 2 (two) hours as needed for dry eyes.   Yes [provider]  Skin Protectants, Misc. (EUCERIN) cream Apply 1 application topically 2 (two) times daily.   Yes [provider]  triamcinolone cream (KENALOG) 0.1 % Apply 1 application topically 3 (three) times daily as needed (dry skin).   Yes [provider]  vitamin C (ASCORBIC ACID) 500 MG tablet Take 500 mg by mouth daily.    Yes [provider]  bacitracin ointment Apply 1 application 2 (two) times daily for 7 days topically. 12/26/16 01/02/17  Duffy Bruce, MD    Family  History Family History  Problem Relation Age of Onset  . Breast cancer Unknown        multiple  . Dementia Sister   . High blood pressure Unknown        multiple  . Diabetes Unknown        multiple  . Colon cancer Neg Hx     Social History Social History   Tobacco Use  . Smoking status: Never Smoker  . Smokeless tobacco: Never Used  Substance Use Topics  . Alcohol use: No  . Drug use: No     Allergies   Ace inhibitors; Codeine; Penicillins; Sulfa antibiotics; and Tramadol   Review of Systems Review of Systems  Unable to perform ROS: Dementia  Skin: Positive for wound.     Physical Exam  Updated Vital Signs BP (!) 158/47 (BP Location: Left Arm)   Pulse 60   Resp (!) 22   SpO2 98%   Physical Exam  Constitutional: She appears well-developed and well-nourished. No distress.  HENT:  Head: Normocephalic and atraumatic.  Superficial skin tears to right brow with proximally 1.5 cm curvilinear laceration over the right lateral eyebrow.  No involvement of the orbit.  No periorbital ecchymosis.  No deformity.  No midface instability.  Nasal septum intact.  Eyes: Conjunctivae are normal.  Neck: Neck supple.  Cardiovascular: Normal rate, regular rhythm and normal heart sounds. Exam reveals no friction rub.  No murmur heard. Pulmonary/Chest: Effort normal and breath sounds normal. No respiratory distress. She has no wheezes. She has no rales.  Abdominal: She exhibits no distension.  Musculoskeletal: She exhibits no edema.  Neurological: She is alert. She exhibits normal muscle tone.  Skin: Skin is warm. Capillary refill takes less than 2 seconds.  Psychiatric: She has a normal mood and affect.  Nursing note and vitals reviewed.    ED Treatments / Results  Labs (all labs ordered are listed, but only abnormal results are displayed) Labs Reviewed - No data to display  EKG  EKG Interpretation None       Radiology Ct Head Wo Contrast  Result Date:  12/26/2016 CLINICAL DATA:  Small bilateral head abrasions following a fall. EXAM: CT HEAD WITHOUT CONTRAST CT CERVICAL SPINE WITHOUT CONTRAST TECHNIQUE: Multidetector CT imaging of the head and cervical spine was performed following the standard protocol without intravenous contrast. Multiplanar CT image reconstructions of the cervical spine were also generated. COMPARISON:  08/22/2016 and 03/16/2016. FINDINGS: CT HEAD FINDINGS Brain: Diffusely enlarged ventricles and subarachnoid spaces. Patchy white matter low density in both cerebral hemispheres. No intracranial hemorrhage, mass lesion or CT evidence of acute infarction. Vascular: No hyperdense vessel or unexpected calcification. Skull: Bilateral hyperostosis frontalis.  No skull fracture. Sinuses/Orbits: Status post bilateral cataract extraction and left lens replacement. Right maxillary sinus mucosal thickening. Other: Linear increased density and mild soft tissue swelling in the right lateral periorbital subcutaneous fat, compatible hemorrhage. CT CERVICAL SPINE FINDINGS Alignment: Stable anterior subluxations at the C3-4, C4-5 and C7-T1 levels. Skull base and vertebrae: No acute fracture. No primary bone lesion or focal pathologic process. Soft tissues and spinal canal: No prevertebral fluid or swelling. No visible canal hematoma. Disc levels: Multilevel degenerative changes, including changes of DISH. Mild posterior spur formation at the C5-6 level is unchanged. Upper chest: Clear lung apices. A linear lucency in the posterior aspect of the right third rib has not changed significantly since 08/22/2016 or 03/16/2016. Other: Left carotid artery calcification and minimal right carotid artery calcification. IMPRESSION: 1. No skull fracture or intracranial hemorrhage. 2. No cervical spine fracture or acute subluxation. 3. Stable diffuse cerebral and cerebellar atrophy and chronic small vessel white matter ischemic changes in both cerebral hemispheres. 4. Mild  right lateral periorbital soft tissue swelling and subcutaneous hemorrhage. 5. Cervical spine degenerative changes. 6. Chronic linear lucency in the posterior aspect of the right third rib, most likely representing a nutrient channel 7. Left carotid artery atheromatous calcification and minimal right carotid artery atheromatous calcification. Electronically Signed   By: Claudie Revering M.D.   On: 12/26/2016 10:03   Ct Cervical Spine Wo Contrast  Result Date: 12/26/2016 CLINICAL DATA:  Small bilateral head abrasions following a fall. EXAM: CT HEAD WITHOUT CONTRAST CT CERVICAL SPINE WITHOUT CONTRAST TECHNIQUE: Multidetector CT imaging of the head and cervical spine was performed  following the standard protocol without intravenous contrast. Multiplanar CT image reconstructions of the cervical spine were also generated. COMPARISON:  08/22/2016 and 03/16/2016. FINDINGS: CT HEAD FINDINGS Brain: Diffusely enlarged ventricles and subarachnoid spaces. Patchy white matter low density in both cerebral hemispheres. No intracranial hemorrhage, mass lesion or CT evidence of acute infarction. Vascular: No hyperdense vessel or unexpected calcification. Skull: Bilateral hyperostosis frontalis.  No skull fracture. Sinuses/Orbits: Status post bilateral cataract extraction and left lens replacement. Right maxillary sinus mucosal thickening. Other: Linear increased density and mild soft tissue swelling in the right lateral periorbital subcutaneous fat, compatible hemorrhage. CT CERVICAL SPINE FINDINGS Alignment: Stable anterior subluxations at the C3-4, C4-5 and C7-T1 levels. Skull base and vertebrae: No acute fracture. No primary bone lesion or focal pathologic process. Soft tissues and spinal canal: No prevertebral fluid or swelling. No visible canal hematoma. Disc levels: Multilevel degenerative changes, including changes of DISH. Mild posterior spur formation at the C5-6 level is unchanged. Upper chest: Clear lung apices. A linear  lucency in the posterior aspect of the right third rib has not changed significantly since 08/22/2016 or 03/16/2016. Other: Left carotid artery calcification and minimal right carotid artery calcification. IMPRESSION: 1. No skull fracture or intracranial hemorrhage. 2. No cervical spine fracture or acute subluxation. 3. Stable diffuse cerebral and cerebellar atrophy and chronic small vessel white matter ischemic changes in both cerebral hemispheres. 4. Mild right lateral periorbital soft tissue swelling and subcutaneous hemorrhage. 5. Cervical spine degenerative changes. 6. Chronic linear lucency in the posterior aspect of the right third rib, most likely representing a nutrient channel 7. Left carotid artery atheromatous calcification and minimal right carotid artery atheromatous calcification. Electronically Signed   By: Claudie Revering M.D.   On: 12/26/2016 10:03    Procedures .Marland KitchenLaceration Repair Date/Time: 12/26/2016 10:42 AM Performed by: Duffy Bruce, MD Authorized by: Duffy Bruce, MD   Consent:    Consent obtained:  Verbal   Consent given by:  Patient   Risks discussed:  Infection, need for additional repair, pain, tendon damage, retained foreign body, vascular damage, poor cosmetic result, poor wound healing and nerve damage   Alternatives discussed:  Referral and delayed treatment Anesthesia (see MAR for exact dosages):    Anesthesia method:  Local infiltration   Local anesthetic:  Lidocaine 1% WITH epi Laceration details:    Location: Right brow.   Length (cm):  1.5 Repair type:    Repair type:  Simple Pre-procedure details:    Preparation:  Patient was prepped and draped in usual sterile fashion and imaging obtained to evaluate for foreign bodies Exploration:    Hemostasis achieved with:  Direct pressure   Wound exploration: wound explored through full range of motion and entire depth of wound probed and visualized   Treatment:    Area cleansed with:  Betadine   Amount of  cleaning:  Extensive   Irrigation solution:  Sterile water   Irrigation method:  Pressure wash Skin repair:    Repair method:  Sutures   Suture size:  5-0   Suture material:  Plain gut   Suture technique:  Simple interrupted   Number of sutures:  3 Approximation:    Approximation:  Close   [place] border: well-aligned   Post-procedure details:    Dressing:  Antibiotic ointment   Patient tolerance of procedure:  Tolerated well, no immediate complications   (including critical care time)  Medications Ordered in ED Medications  lidocaine-EPINEPHrine (XYLOCAINE W/EPI) 2 %-1:100000 (with pres) injection 20 mL (20 mLs  Intradermal Given 12/26/16 0017)     Initial Impression / Assessment and Plan / ED Course  I have reviewed the triage vital signs and the nursing notes.  Pertinent labs & imaging results that were available during my care of the patient were reviewed by me and considered in my medical decision making (see chart for details).     83 year old female here with superficial laceration and abrasions to right face after unwitnessed fall.  She is otherwise at her baseline state of health according to her facility.  Imaging is negative.  Laceration repaired and skin tears dressed with bacitracin and a dry dressing.  Will discharge with outpatient follow-up.  She is not on blood thinners.  This note was prepared with assistance of Systems analyst. Occasional wrong-word or sound-a-like substitutions may have occurred due to the inherent limitations of voice recognition software.   Final Clinical Impressions(s) / ED Diagnoses   Final diagnoses:  Fall, initial encounter  Facial laceration, initial encounter    ED Discharge Orders        Ordered    bacitracin ointment  2 times daily     12/26/16 1044       Duffy Bruce, MD 12/26/16 1044

## 2016-12-26 NOTE — ED Triage Notes (Signed)
Pt. Brought in via [facility] from [place] home. Last seen at 0630 at 1st rounds. At 7:30ish, 2nd set of rounds, found her on the ground. Small abrasion on left side of head. Pt. Is alert. Tracks you when you say her name. No blood thinners. Towel around her neck. BP- 166/70, HR-64, RR-20, 100% Room air, CBG 118. Lung sounds are clear. Per EMS.

## 2016-12-26 NOTE — ED Notes (Signed)
Pt. Not answering questions to finish all questions for triage.

## 2016-12-26 NOTE — ED Notes (Signed)
Report given to Tammie at [facility].

## 2016-12-26 NOTE — ED Notes (Signed)
Bed: RESA Expected date:  Expected time:  Means of arrival:  Comments: EMS: fall 83 yo

## 2016-12-26 NOTE — Discharge Instructions (Signed)
Apply bacitracin or other antibiotic ointment and a dry dressing to the patient's wounds twice a day for the next 5-7 days, until healed.  The stitches will dissolve on their own.

## 2017-04-18 DEATH — deceased
